# Patient Record
Sex: Male | Born: 1960
Health system: Southern US, Community
[De-identification: ages and names within clinical notes are randomized; demographics above are authoritative.]

## PROBLEM LIST (undated history)

## (undated) DIAGNOSIS — I10 Essential (primary) hypertension: Secondary | ICD-10-CM

## (undated) DIAGNOSIS — K760 Fatty (change of) liver, not elsewhere classified: Secondary | ICD-10-CM

## (undated) DIAGNOSIS — K8020 Calculus of gallbladder without cholecystitis without obstruction: Secondary | ICD-10-CM

## (undated) DIAGNOSIS — K219 Gastro-esophageal reflux disease without esophagitis: Secondary | ICD-10-CM

## (undated) DIAGNOSIS — E785 Hyperlipidemia, unspecified: Secondary | ICD-10-CM

## (undated) DIAGNOSIS — F419 Anxiety disorder, unspecified: Secondary | ICD-10-CM

## (undated) DIAGNOSIS — Z8719 Personal history of other diseases of the digestive system: Secondary | ICD-10-CM

## (undated) HISTORY — DX: Essential (primary) hypertension: I10

## (undated) HISTORY — DX: Hyperlipidemia, unspecified: E78.5

## (undated) HISTORY — DX: Anxiety disorder, unspecified: F41.9

## (undated) HISTORY — PX: OTHER SURGICAL HISTORY: SHX169

## (undated) HISTORY — PX: ORIF TOE FRACTURE: SUR965

## (undated) HISTORY — DX: Fatty (change of) liver, not elsewhere classified: K76.0

## (undated) HISTORY — DX: Calculus of gallbladder without cholecystitis without obstruction: K80.20

---

## 1999-10-24 ENCOUNTER — Encounter: Payer: Self-pay | Admitting: Emergency Medicine

## 1999-10-24 ENCOUNTER — Emergency Department (HOSPITAL_COMMUNITY): Admission: EM | Admit: 1999-10-24 | Discharge: 1999-10-24 | Payer: Self-pay | Admitting: Emergency Medicine

## 1999-11-03 ENCOUNTER — Emergency Department (HOSPITAL_COMMUNITY): Admission: EM | Admit: 1999-11-03 | Discharge: 1999-11-03 | Payer: Self-pay | Admitting: *Deleted

## 2001-02-11 ENCOUNTER — Emergency Department (HOSPITAL_COMMUNITY): Admission: EM | Admit: 2001-02-11 | Discharge: 2001-02-11 | Payer: Self-pay | Admitting: Emergency Medicine

## 2001-02-11 ENCOUNTER — Encounter: Payer: Self-pay | Admitting: Emergency Medicine

## 2001-05-05 ENCOUNTER — Emergency Department (HOSPITAL_COMMUNITY): Admission: EM | Admit: 2001-05-05 | Discharge: 2001-05-06 | Payer: Self-pay | Admitting: Emergency Medicine

## 2001-05-06 ENCOUNTER — Encounter: Payer: Self-pay | Admitting: Emergency Medicine

## 2001-11-30 ENCOUNTER — Encounter: Payer: Self-pay | Admitting: Emergency Medicine

## 2001-11-30 ENCOUNTER — Emergency Department (HOSPITAL_COMMUNITY): Admission: EM | Admit: 2001-11-30 | Discharge: 2001-11-30 | Payer: Self-pay | Admitting: Emergency Medicine

## 2002-08-18 ENCOUNTER — Emergency Department (HOSPITAL_COMMUNITY): Admission: EM | Admit: 2002-08-18 | Discharge: 2002-08-18 | Payer: Self-pay | Admitting: Emergency Medicine

## 2002-10-29 ENCOUNTER — Ambulatory Visit (HOSPITAL_BASED_OUTPATIENT_CLINIC_OR_DEPARTMENT_OTHER): Admission: RE | Admit: 2002-10-29 | Discharge: 2002-10-29 | Payer: Self-pay | Admitting: Orthopedic Surgery

## 2004-08-30 ENCOUNTER — Encounter (INDEPENDENT_AMBULATORY_CARE_PROVIDER_SITE_OTHER): Payer: Self-pay | Admitting: *Deleted

## 2004-08-30 ENCOUNTER — Encounter: Admission: RE | Admit: 2004-08-30 | Discharge: 2004-08-30 | Payer: Self-pay | Admitting: Family Medicine

## 2004-09-17 ENCOUNTER — Encounter (INDEPENDENT_AMBULATORY_CARE_PROVIDER_SITE_OTHER): Payer: Self-pay | Admitting: *Deleted

## 2004-09-17 ENCOUNTER — Encounter: Admission: RE | Admit: 2004-09-17 | Discharge: 2004-09-17 | Payer: Self-pay | Admitting: Family Medicine

## 2005-04-23 ENCOUNTER — Emergency Department (HOSPITAL_COMMUNITY): Admission: EM | Admit: 2005-04-23 | Discharge: 2005-04-24 | Payer: Self-pay | Admitting: Emergency Medicine

## 2005-05-02 ENCOUNTER — Ambulatory Visit: Payer: Self-pay | Admitting: Family Medicine

## 2005-05-05 ENCOUNTER — Ambulatory Visit: Payer: Self-pay | Admitting: Family Medicine

## 2005-11-04 ENCOUNTER — Ambulatory Visit: Payer: Self-pay | Admitting: Internal Medicine

## 2006-07-05 ENCOUNTER — Ambulatory Visit: Payer: Self-pay | Admitting: Family Medicine

## 2006-08-23 ENCOUNTER — Ambulatory Visit: Payer: Self-pay | Admitting: Family Medicine

## 2006-08-24 ENCOUNTER — Encounter: Payer: Self-pay | Admitting: Family Medicine

## 2006-12-16 ENCOUNTER — Encounter: Payer: Self-pay | Admitting: Family Medicine

## 2006-12-16 LAB — CONVERTED CEMR LAB
ALT: 66 units/L — ABNORMAL HIGH (ref 0–53)
Albumin: 4.4 g/dL (ref 3.5–5.2)
Bilirubin, Direct: 0.1 mg/dL (ref 0.0–0.3)
Cholesterol: 165 mg/dL (ref 0–200)
HDL: 53 mg/dL (ref >39)
Total CHOL/HDL Ratio: 3.1
Total Protein: 7.6 g/dL (ref 6.0–8.3)
VLDL: 19 mg/dL (ref 0–40)

## 2007-02-07 ENCOUNTER — Ambulatory Visit: Payer: Self-pay | Admitting: Internal Medicine

## 2007-07-30 ENCOUNTER — Telehealth: Payer: Self-pay | Admitting: Family Medicine

## 2007-08-29 ENCOUNTER — Telehealth (INDEPENDENT_AMBULATORY_CARE_PROVIDER_SITE_OTHER): Payer: Self-pay | Admitting: *Deleted

## 2007-08-30 ENCOUNTER — Telehealth (INDEPENDENT_AMBULATORY_CARE_PROVIDER_SITE_OTHER): Payer: Self-pay | Admitting: *Deleted

## 2007-08-31 ENCOUNTER — Ambulatory Visit: Payer: Self-pay | Admitting: Internal Medicine

## 2007-10-01 ENCOUNTER — Telehealth (INDEPENDENT_AMBULATORY_CARE_PROVIDER_SITE_OTHER): Payer: Self-pay | Admitting: *Deleted

## 2007-10-23 ENCOUNTER — Telehealth (INDEPENDENT_AMBULATORY_CARE_PROVIDER_SITE_OTHER): Payer: Self-pay | Admitting: *Deleted

## 2007-12-22 ENCOUNTER — Encounter: Payer: Self-pay | Admitting: Internal Medicine

## 2007-12-22 LAB — CONVERTED CEMR LAB
ALT: 58 units/L — ABNORMAL HIGH (ref 0–53)
AST: 44 units/L — ABNORMAL HIGH (ref 0–37)
Albumin: 4.3 g/dL (ref 3.5–5.2)
Calcium: 8.6 mg/dL (ref 8.4–10.5)
Cholesterol: 156 mg/dL (ref 0–200)
Glucose, Bld: 94 mg/dL (ref 70–99)
HDL: 47 mg/dL (ref >39)
Potassium: 3.9 meq/L (ref 3.5–5.3)
Sodium: 139 meq/L (ref 135–145)
Total CHOL/HDL Ratio: 3.3
Total Protein: 7.1 g/dL (ref 6.0–8.3)
Triglycerides: 89 mg/dL (ref <150)
VLDL: 18 mg/dL (ref 0–40)

## 2007-12-24 ENCOUNTER — Emergency Department (HOSPITAL_COMMUNITY): Admission: EM | Admit: 2007-12-24 | Discharge: 2007-12-24 | Payer: Self-pay | Admitting: Emergency Medicine

## 2007-12-25 ENCOUNTER — Ambulatory Visit: Payer: Self-pay | Admitting: Internal Medicine

## 2007-12-25 DIAGNOSIS — R7401 Elevation of levels of liver transaminase levels: Secondary | ICD-10-CM | POA: Insufficient documentation

## 2007-12-25 DIAGNOSIS — E782 Mixed hyperlipidemia: Secondary | ICD-10-CM | POA: Insufficient documentation

## 2007-12-25 DIAGNOSIS — I10 Essential (primary) hypertension: Secondary | ICD-10-CM | POA: Insufficient documentation

## 2007-12-25 DIAGNOSIS — R7402 Elevation of levels of lactic acid dehydrogenase (LDH): Secondary | ICD-10-CM | POA: Insufficient documentation

## 2007-12-25 DIAGNOSIS — R740 Nonspecific elevation of levels of transaminase and lactic acid dehydrogenase [LDH]: Secondary | ICD-10-CM

## 2007-12-25 LAB — CONVERTED CEMR LAB
Cholesterol, target level: 200 mg/dL
LDL Goal: 130 mg/dL

## 2007-12-26 ENCOUNTER — Encounter (INDEPENDENT_AMBULATORY_CARE_PROVIDER_SITE_OTHER): Payer: Self-pay | Admitting: *Deleted

## 2007-12-26 ENCOUNTER — Telehealth: Payer: Self-pay | Admitting: Internal Medicine

## 2007-12-27 ENCOUNTER — Telehealth (INDEPENDENT_AMBULATORY_CARE_PROVIDER_SITE_OTHER): Payer: Self-pay | Admitting: *Deleted

## 2008-03-28 ENCOUNTER — Telehealth (INDEPENDENT_AMBULATORY_CARE_PROVIDER_SITE_OTHER): Payer: Self-pay | Admitting: *Deleted

## 2008-04-01 ENCOUNTER — Emergency Department (HOSPITAL_COMMUNITY): Admission: EM | Admit: 2008-04-01 | Discharge: 2008-04-01 | Payer: Self-pay | Admitting: Emergency Medicine

## 2008-04-01 ENCOUNTER — Encounter: Admission: RE | Admit: 2008-04-01 | Discharge: 2008-04-01 | Payer: Self-pay | Admitting: Orthopedic Surgery

## 2008-05-16 ENCOUNTER — Telehealth (INDEPENDENT_AMBULATORY_CARE_PROVIDER_SITE_OTHER): Payer: Self-pay | Admitting: *Deleted

## 2008-05-17 ENCOUNTER — Encounter: Payer: Self-pay | Admitting: Internal Medicine

## 2008-05-17 ENCOUNTER — Encounter: Payer: Self-pay | Admitting: Family Medicine

## 2008-05-17 ENCOUNTER — Ambulatory Visit (HOSPITAL_BASED_OUTPATIENT_CLINIC_OR_DEPARTMENT_OTHER): Admission: RE | Admit: 2008-05-17 | Discharge: 2008-05-17 | Payer: Self-pay | Admitting: Internal Medicine

## 2008-05-29 ENCOUNTER — Encounter (INDEPENDENT_AMBULATORY_CARE_PROVIDER_SITE_OTHER): Payer: Self-pay | Admitting: *Deleted

## 2008-06-09 ENCOUNTER — Telehealth (INDEPENDENT_AMBULATORY_CARE_PROVIDER_SITE_OTHER): Payer: Self-pay | Admitting: *Deleted

## 2008-10-03 ENCOUNTER — Encounter: Payer: Self-pay | Admitting: Family Medicine

## 2008-10-14 ENCOUNTER — Emergency Department (HOSPITAL_BASED_OUTPATIENT_CLINIC_OR_DEPARTMENT_OTHER): Admission: EM | Admit: 2008-10-14 | Discharge: 2008-10-14 | Payer: Self-pay | Admitting: Emergency Medicine

## 2008-10-17 ENCOUNTER — Telehealth: Payer: Self-pay | Admitting: Family Medicine

## 2008-10-22 ENCOUNTER — Emergency Department (HOSPITAL_BASED_OUTPATIENT_CLINIC_OR_DEPARTMENT_OTHER): Admission: EM | Admit: 2008-10-22 | Discharge: 2008-10-22 | Payer: Self-pay | Admitting: Emergency Medicine

## 2008-11-24 ENCOUNTER — Encounter (INDEPENDENT_AMBULATORY_CARE_PROVIDER_SITE_OTHER): Payer: Self-pay | Admitting: *Deleted

## 2008-12-04 ENCOUNTER — Telehealth (INDEPENDENT_AMBULATORY_CARE_PROVIDER_SITE_OTHER): Payer: Self-pay | Admitting: *Deleted

## 2008-12-13 ENCOUNTER — Encounter: Payer: Self-pay | Admitting: Internal Medicine

## 2008-12-14 LAB — CONVERTED CEMR LAB
ALT: 52 units/L (ref 0–53)
Albumin: 4.1 g/dL (ref 3.5–5.2)
Alkaline Phosphatase: 67 units/L (ref 39–117)
BUN: 14 mg/dL (ref 6–23)
Basophils Absolute: 0 10*3/uL (ref 0.0–0.1)
Basophils Relative: 0 % (ref 0–1)
Chloride: 108 meq/L (ref 96–112)
Cholesterol: 150 mg/dL (ref 0–200)
Creatinine, Ser: 0.89 mg/dL (ref 0.40–1.50)
Eosinophils Absolute: 0.1 10*3/uL (ref 0.0–0.7)
HDL: 50 mg/dL (ref >39)
Indirect Bilirubin: 0.6 mg/dL (ref 0.0–0.9)
LDL Cholesterol: 83 mg/dL (ref 0–99)
MCHC: 34.9 g/dL (ref 30.0–36.0)
MCV: 91.2 fL (ref 78.0–100.0)
Monocytes Absolute: 0.6 10*3/uL (ref 0.1–1.0)
Neutro Abs: 3.2 10*3/uL (ref 1.7–7.7)
Neutrophils Relative %: 55 % (ref 43–77)
Potassium: 4.2 meq/L (ref 3.5–5.3)
RDW: 13 % (ref 11.5–15.5)
Total Protein: 6.8 g/dL (ref 6.0–8.3)
Triglycerides: 86 mg/dL (ref <150)

## 2008-12-16 ENCOUNTER — Encounter (INDEPENDENT_AMBULATORY_CARE_PROVIDER_SITE_OTHER): Payer: Self-pay | Admitting: *Deleted

## 2008-12-16 ENCOUNTER — Ambulatory Visit: Payer: Self-pay | Admitting: Internal Medicine

## 2008-12-16 LAB — CONVERTED CEMR LAB: OCCULT 1: NEGATIVE

## 2008-12-18 ENCOUNTER — Ambulatory Visit: Payer: Self-pay | Admitting: Internal Medicine

## 2009-01-13 DIAGNOSIS — R1310 Dysphagia, unspecified: Secondary | ICD-10-CM | POA: Insufficient documentation

## 2009-02-27 ENCOUNTER — Telehealth: Payer: Self-pay | Admitting: Internal Medicine

## 2009-03-02 ENCOUNTER — Encounter: Payer: Self-pay | Admitting: Internal Medicine

## 2009-03-04 LAB — CONVERTED CEMR LAB
Albumin, U: DETECTED %
Alpha 1, Urine: DETECTED % — AB
Alpha 2, Urine: DETECTED % — AB
Time: 24
Total Protein, Urine-Ur/day: 35 mg/24hr (ref 10–140)
Volume, Urine: 1500 mL

## 2009-03-05 ENCOUNTER — Encounter (INDEPENDENT_AMBULATORY_CARE_PROVIDER_SITE_OTHER): Payer: Self-pay | Admitting: *Deleted

## 2009-05-14 ENCOUNTER — Encounter: Payer: Self-pay | Admitting: Internal Medicine

## 2009-05-26 ENCOUNTER — Ambulatory Visit: Payer: Self-pay | Admitting: Internal Medicine

## 2009-05-26 DIAGNOSIS — H547 Unspecified visual loss: Secondary | ICD-10-CM | POA: Insufficient documentation

## 2009-09-28 ENCOUNTER — Encounter: Payer: Self-pay | Admitting: Internal Medicine

## 2009-10-19 ENCOUNTER — Encounter: Payer: Self-pay | Admitting: Internal Medicine

## 2010-02-12 ENCOUNTER — Telehealth (INDEPENDENT_AMBULATORY_CARE_PROVIDER_SITE_OTHER): Payer: Self-pay | Admitting: *Deleted

## 2010-04-17 ENCOUNTER — Encounter: Payer: Self-pay | Admitting: Internal Medicine

## 2010-04-25 LAB — CONVERTED CEMR LAB
Albumin: 4.4 g/dL (ref 3.5–5.2)
Alkaline Phosphatase: 78 units/L (ref 39–117)
Basophils Relative: 0 % (ref 0–1)
Bilirubin, Direct: 0.1 mg/dL (ref 0.0–0.3)
CO2: 23 meq/L (ref 19–32)
Chloride: 108 meq/L (ref 96–112)
Creatinine, Ser: 0.93 mg/dL (ref 0.40–1.50)
Eosinophils Absolute: 0.1 10*3/uL (ref 0.0–0.7)
Eosinophils Relative: 2 % (ref 0–5)
Glucose, Bld: 91 mg/dL (ref 70–99)
HCT: 49.1 % (ref 39.0–52.0)
HDL: 52 mg/dL (ref >39)
Hemoglobin: 16.8 g/dL (ref 13.0–17.0)
LDL Cholesterol: 123 mg/dL — ABNORMAL HIGH (ref 0–99)
Lymphs Abs: 2.2 10*3/uL (ref 0.7–4.0)
MCHC: 34.2 g/dL (ref 30.0–36.0)
MCV: 91.1 fL (ref 78.0–100.0)
Monocytes Absolute: 0.6 10*3/uL (ref 0.1–1.0)
Monocytes Relative: 9 % (ref 3–12)
Neutrophils Relative %: 59 % (ref 43–77)
RBC: 5.39 M/uL (ref 4.22–5.81)
TSH: 1.398 microintl units/mL (ref 0.350–4.500)
Total Bilirubin: 0.8 mg/dL (ref 0.3–1.2)
WBC: 7.3 10*3/uL (ref 4.0–10.5)

## 2010-04-29 ENCOUNTER — Ambulatory Visit: Payer: Self-pay | Admitting: Internal Medicine

## 2010-04-29 DIAGNOSIS — Z9189 Other specified personal risk factors, not elsewhere classified: Secondary | ICD-10-CM | POA: Insufficient documentation

## 2010-04-29 DIAGNOSIS — F411 Generalized anxiety disorder: Secondary | ICD-10-CM | POA: Insufficient documentation

## 2010-05-03 ENCOUNTER — Telehealth (INDEPENDENT_AMBULATORY_CARE_PROVIDER_SITE_OTHER): Payer: Self-pay | Admitting: *Deleted

## 2010-10-18 ENCOUNTER — Telehealth: Payer: Self-pay | Admitting: Internal Medicine

## 2010-10-26 ENCOUNTER — Telehealth: Payer: Self-pay | Admitting: Internal Medicine

## 2010-11-03 ENCOUNTER — Ambulatory Visit: Payer: Self-pay | Admitting: Internal Medicine

## 2010-12-12 ENCOUNTER — Encounter: Payer: Self-pay | Admitting: Orthopedic Surgery

## 2010-12-19 LAB — CONVERTED CEMR LAB
Bilirubin Urine: NEGATIVE
Glucose, Urine, Semiquant: NEGATIVE
Ketones, urine, test strip: NEGATIVE
pH: 6

## 2010-12-21 NOTE — Progress Notes (Signed)
Summary: Triage Call: F/U info from last Office Visit-  Phone Note Call from Patient Call back at Home Phone [phone]   Caller: Spouse-Betsy Summary of Call: Message left on VM: Patient called [facility] to request records, records perged and no longer exsist. The do have the dat as 2005 for Radiology exam Dr.Hopper was inquiring about but no actual records.  1.) Patient would like to know if Dr.Hopper would recommended he have a CT or any other radiology exams at this time  2.) Patient will follow-up with eye Dr -appointment not scheduled yet but patient will schedule  Dr.Hopper please advise./[facility]  May 03, 2010 1:04 PM   Follow-up for Phone Call        Records release for [facility] CT brain scan  2005; no repeat CT needed based on CPX exam Follow-up by: Marga Melnick MD,  May 03, 2010 3:50 PM  Additional Follow-up for Phone Call Additional follow up Details #1::        left message on machine ..........Marland KitchenDoristine Devoid  May 03, 2010 4:35 PM      Appended Document: Triage Call: F/U info from last Office Visit    Phone Note Call from Patient   Summary of Call: wife returned call they were out of town   Follow-up for Phone Call        left message on machine .....Marland KitchenMarland KitchenDoristine Devoid  May 11, 2010 2:25 PM   spoke w/ patient wife aware of recommendations.......Marland KitchenDoristine Devoid  May 12, 2010 9:33 AM

## 2010-12-21 NOTE — Assessment & Plan Note (Signed)
Summary: cpx//already had labs//lch   Vital Signs:  Patient profile:   50 year old male Height:      71.25 inches Weight:      241.8 pounds BMI:     33.61 Temp:     98.6 degrees F oral Pulse rate:   79 / minute Resp:     16 per minute BP sitting:   130 / 82  (left arm) Cuff size:   large  Vitals Entered By: Shonna Chock (April 29, 2010 2:41 PM)  Comments REVIEWED MED LIST, PATIENT AGREED DOSE AND INSTRUCTION CORRECT    History of Present Illness: Justin Townsend is here for a physical; he is essentially asymptomatic except for some anxiety symptoms.  Lipid Management History:      Positive NCEP/ATP III risk factors include male age 34 years old or older, current tobacco user, and hypertension.  Negative NCEP/ATP III risk factors include non-diabetic, no family history for ischemic heart disease, no ASHD (atherosclerotic heart disease), no prior stroke/TIA, no peripheral vascular disease, and no history of aortic aneurysm.     Allergies (verified): No Known Drug Allergies  Past History:  Past Medical History: Hyperlipidemia: NMR 2007:LDL 128( 1398/542),HDL 40,TG 128. LDL goal= <120. [place] LDL goal = < 130. Hypertension Elevated LFTs,PMH of ; PMH of Fatty Liver; Sudden Vision Lost 2010, Dr Katherina Right, Optometrist  Past Surgical History: Left thumb surgery; fracture 5th toe (rod placed);  Family History: father: healthy; mother: ulcer,partial small bowel resection,cirrhosis ? 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 Eyes:  Denies blurring, double vision, and vision loss-both eyes. ENT:  Denies hoarseness; Difficulty swallowing only if rushed. CV:  Denies chest pain or discomfort, leg cramps  with exertion, palpitations, and shortness of breath with exertion. Resp:  Complains of excessive snoring; denies cough, hypersomnolence, shortness of breath, and sputum productive; ?Apnea for a second as per wife. GI:  Denies abdominal pain, bloody stools, dark tarry stools, and indigestion. GU:  Denies discharge, dysuria, and hematuria. MS:  Denies joint pain, low back pain, mid back pain, and thoracic pain. Derm:  Denies changes in nail beds, dryness, hair loss, and lesion(s). Neuro:  Denies numbness and tingling; ? remote PMH of seizure evaluated in ER & by ? [facility];I requested  records release be completed to allow review. Psych:  Complains of anxiety, easily angered, irritability, and panic attacks; denies depression and easily tearful; "Claustrophobic"  in semi open  simulator & in car wash; more frequent  anxiety  in past 6-12  months.. Endo:  Denies cold intolerance, excessive hunger, excessive thirst, excessive urination, and heat intolerance.  Physical Exam  General:  well-nourished,in no acute distress; alert,appropriate and cooperative throughout examination Head:  Normocephalic and atraumatic without obvious abnormalities. No apparent alopecia  Eyes:  No corneal or conjunctival inflammation noted. EOMI. Perrla. Funduscopic exam benign, without hemorrhages, exudates or papilledema.No lid lag Ears:  External ear exam shows no significant lesions or deformities.  Otoscopic examination reveals clear canals, tympanic membranes are intact bilaterally without bulging, retraction, inflammation or discharge. Hearing is grossly normal bilaterally. Nose:  External nasal examination shows no deformity or inflammation. Nasal mucosa are pink and moist without lesions or exudates. Mouth:  Oral mucosa and oropharynx without lesions or exudates.  Teeth in good repair. Neck:  No deformities, masses, or tenderness noted. Lungs:  Normal respiratory effort, chest expands symmetrically. Lungs  are clear to auscultation, no crackles or wheezes. Heart:  Normal rate and regular rhythm. S1 and S2 normal without gallop, murmur, click, rub.S4 Abdomen:  Bowel sounds positive,abdomen soft and non-tender without masses, organomegaly or hernias noted. Rectal:  No external abnormalities noted. Normal sphincter tone. No rectal masses or tenderness. Genitalia:  Testes bilaterally descended without nodularity, tenderness or masses. No scrotal masses or lesions. No penis lesions or urethral discharge. Prostate:  Suboptimal prostate by DRE Msk:  No deformity or scoliosis noted   but  R  thoracic musculature > L Pulses:  R and L carotid,radial,dorsalis pedis and posterior tibial pulses are full and equal bilaterally Extremities:  No clubbing, cyanosis or deformity noted with normal full range of motion of all joints. .  trace left pedal edema and trace right pedal edema.   Neurologic:  alert & oriented X3 and DTRs symmetrical and normal.   Skin:  Intact without suspicious lesions or rashes Cervical Nodes:  No lymphadenopathy noted Axillary Nodes:  No palpable lymphadenopathy Inguinal Nodes:  No significant adenopathy Psych:  memory intact for recent and remote, not anxious appearing, not depressed appearing, and subdued.     Impression & Recommendations:  Problem # 1:  ROUTINE GENERAL MEDICAL EXAM@HEALTH  CARE FACL (ICD-V70.0)  Orders: EKG w/ Interpretation (93000)  Problem # 2:  HYPERLIPIDEMIA (ICD-272.2)  His updated medication list for this problem includes:    Zetia 10 Mg Tabs (Ezetimibe) .Marland Kitchen... Take one tablet daily,  Problem # 3:  VISUAL ACUITY, DECREASED, RIGHT EYE (ICD-369.9) PMH of ; annual monitor stressed  Problem # 4:  ANXIETY STATE, UNSPECIFIED (ICD-300.00)  His updated medication list for this problem includes:    Fluoxetine Hcl 10 Mg Caps (Fluoxetine hcl) .Marland Kitchen... 1 once daily  Problem # 5:  HYPERTENSION (ICD-401.9)  controlled  Orders: EKG w/ Interpretation  (93000)  Complete Medication List: 1)  Zetia 10 Mg Tabs (Ezetimibe) .... Take one tablet daily, 2)  Omega 3 2-3 Qd  3)  Multivitamins Tabs (Multiple vitamin) .Marland Kitchen.. 1 by mouth once daily 4)  Fluoxetine Hcl 10 Mg Caps (Fluoxetine hcl) .Marland Kitchen.. 1 once daily  Lipid Assessment/Plan:      Based on NCEP/ATP III, the patient's risk factor category is "0-1 risk factors".  The patient's lipid goals are as follows: Total cholesterol goal is 200; LDL cholesterol goal is 130; HDL cholesterol goal is 40; Triglyceride goal is 150.  His LDL cholesterol goal has been met.    Patient Instructions: 1)  Please schedule a follow-up appointment in 6-8  weeks. Monitor for apnea spells. Please sign Release of Records form to obtain [facility] records for review. Prescriptions: FLUOXETINE HCL 10 MG CAPS (FLUOXETINE HCL) 1 once daily  #30 x 5   Entered and Authorized by:   Marga Melnick MD   Signed by:   Marga Melnick MD on 04/29/2010   Method used:   Print then Give to Patient   RxID:   [id number] ZETIA 10 MG  TABS (EZETIMIBE) take one tablet daily,  #30 x 11   Entered and Authorized by:   Marga Melnick MD   Signed by:   Marga Melnick MD on 04/29/2010   Method used:   Print then Give to Patient   RxID:   [id number]    Immunization History:  Tetanus/Td Immunization History:    Tetanus/Td:  historical (08/05/2003)  Laboratory Results   Urine Tests    Routine Urinalysis   Color: straw Appearance: Clear Glucose: negative   (Normal Range: Negative) Bilirubin: negative   (Normal Range: Negative) Ketone: negative   (Normal Range: Negative) Spec. Gravity: >=1.030   (Normal Range: 1.003-1.035) Blood: negative   (Normal Range: Negative) pH: 6.0   (Normal Range: 5.0-8.0) Protein: negative   (Normal Range: Negative) Urobilinogen: 0.2   (Normal Range: 0-1) Nitrite: negative   (Normal Range: Negative) Leukocyte Esterace: negative   (Normal Range: Negative)

## 2010-12-21 NOTE — Progress Notes (Signed)
Summary: lab order request  Phone Note Call from Patient Call back at Home Phone [phone]   Caller: [facility] Summary of Call: Patient needs outatient lab order so he can do labs on Sat. prior to his CPX appt. 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 and would like to do the labwork at [facility]. This can be mailed or she will pick it up. Please advise. Initial call taken by: Lucious Groves CMA,  October 18, 2010 12:37 PM  Follow-up for Phone Call        he had CPX in 04/2010. No F/U needed until 04/2011 Follow-up by: Marga Melnick MD,  October 18, 2010 6:01 PM  Additional Follow-up for Phone Call Additional follow up Details #1::        left message on machine to call back to office. Lucious Groves CMA  October 19, 2010 8:18 AM   left message on machine to call back to office. Lucious Groves CMA  October 20, 2010 10:49 AM  Left message on machine to call back to office. Lucious Groves CMA  October 22, 2010 4:42 PM      Additional Follow-up for Phone Call Additional follow up Details #2::    Pt states that they were told that needed to f/u in 6-8 months. They just want to be postive. please advise.  Follow-up by: Army Fossa CMA,  October 22, 2010 4:50 PM  Additional Follow-up for Phone Call Additional follow up Details #3:: Details for Additional Follow-up Action Taken: I reviewed 05/11 labs & 2007 NMR Lipoprofile. Lipids are @ goal , but they should be rechecked in 03/2011. Additional Follow-up by: Marga Melnick MD,  October 22, 2010 5:47 PM  Left message on machine to call back to office. Lucious Groves CMA  October 25, 2010 10:49 AM   No return call, left message on machine at home to call back to office. Called work # and notified the patient. Lucious Groves CMA  October 26, 2010 10:23 AM

## 2010-12-21 NOTE — Progress Notes (Signed)
Summary: left number to return calls for earlier phone note  Phone Note Call from Patient   Caller: wife Carsyn Taubman Summary of Call: patient's wife works at hospital--it is almost impossible to get her at work---she says it would be fine to call home number = [phone] and leave a detailed message--getting ready to call in for prescription refills  Does patient need to keep appt with Dr Alwyn Ren for 12/14??    Does he need to "do the outside labwork"??   Initial call taken by: Jerolyn Shin,  October 26, 2010 4:57 PM  Follow-up for Phone Call        The patient was notified yesterday. Lucious Groves CMA  October 27, 2010 8:42 AM

## 2010-12-21 NOTE — Progress Notes (Signed)
Summary: Request for lab order  Phone Note Call from Patient Call back at Home Phone [phone]   Caller: Spouse Summary of Call: Patient's wife called, she would like to pick up order for CPX labs and patient will get them done over the weekend at the hospital. 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  Patient's wife would like order placed at the front and she will pick-up on Monday afternoon./[facility]rae [facility]  February 12, 2010 3:37 PM     New/Updated Medications: * PHYSICAL LABS Lipid,Hepatic, TSH, BMP, CBCD, PSA, Udip V70.0, 272.4, 995.20 Prescriptions: PHYSICAL LABS Lipid,Hepatic, TSH, BMP, CBCD, PSA, Udip V70.0, 272.4, 995.20  #1 x 0   Entered by:   Shonna Chock   Authorized by:   Marga Melnick MD   Signed by:   Shonna Chock on 02/12/2010   Method used:   Print then Give to Patient   RxID:   [id number]

## 2011-03-02 ENCOUNTER — Telehealth: Payer: Self-pay | Admitting: Internal Medicine

## 2011-03-02 NOTE — Telephone Encounter (Signed)
Fasting lipids, hepatic panel, BMET, CBC and differential, and TSH (V70.0, 272.4, 790.4)

## 2011-03-02 NOTE — Telephone Encounter (Signed)
Pt's wife states that pt normally has Dr. Hopper write him an order for his CPE labs so he can have those done at [facility] due to him being out of town a lot. Please advise. Pt has CPE scheduled 03/24/11. °

## 2011-03-02 NOTE — Telephone Encounter (Signed)
Pt's wife states that pt normally has Dr. Alwyn Ren write him an order for his CPE labs so he can have those done at [facility] due to him being out of town a lot. Please advise. Pt has CPE scheduled 03/24/11.

## 2011-03-04 NOTE — Telephone Encounter (Signed)
Spoke w/ pt wife order placed up front for pick up

## 2011-03-23 ENCOUNTER — Encounter: Payer: Self-pay | Admitting: Internal Medicine

## 2011-03-24 ENCOUNTER — Encounter: Payer: Self-pay | Admitting: Internal Medicine

## 2011-03-25 ENCOUNTER — Encounter: Payer: Self-pay | Admitting: Internal Medicine

## 2011-04-08 NOTE — Op Note (Signed)
NAME:  Justin Townsend, Justin Townsend                            ACCOUNT NO.:  [id number]   MEDICAL RECORD NO.:  [id number]                   PATIENT TYPE:  AMB   LOCATION:  [facility]                                  FACILITY:  [facility]   PHYSICIAN:  Deidre Ala, M.D.                 DATE OF BIRTH:  [birth year]   DATE OF PROCEDURE:  10/29/2002  DATE OF DISCHARGE:                                 OPERATIVE REPORT   PREOPERATIVE DIAGNOSIS:  Bennett's type fracture dislocation, left thumb  carpometacarpal joint, subacute with small minor fracture fragment.   POSTOPERATIVE DIAGNOSIS:  Bennett's type fracture dislocation, left thumb  carpometacarpal joint, subacute with small minor fracture fragment.   OPERATION PERFORMED:  Closed percutaneous pinning of Bennett's type fracture  dislocation, carpometacarpal joint, left thumb.   SURGEON:  Bradley Ferris, M.D.   ASSISTANT:  __________.   ANESTHESIA:  General with LMA.   CULTURES:  None.   DRAINS:  None.   ESTIMATED BLOOD LOSS:  Without.   TOURNIQUET TIME:  Without.   PATHOLOGIC FINDINGS AND HISTORY:  The patient had an unknown jamming injury  two weeks ago to the base of his left thumb, had pain and swelling.  X-rays  revealed a dorsal dislocation with a very minor chip fracture.  It was  unstable and he wanted to work.  I felt that casting would require a fair  amount of cast pressure and thumb abduction, so we elected to go closed  pinning to hold it until the ligaments healed down at about 6 weeks.  We  were able to reduce him anatomically, place two slightly crossed divergent K-  wires from base of the dorsal thumb metacarpal into the trapezium.  C-arm  fluoroscopy confirmed the reduction.  Again there was no major fracture  fragment that we could see at the time of pinning.  There was a small chip  that we saw preoperatively.   DESCRIPTION OF PROCEDURE:  With adequate anesthesia obtained using LMA  technique, 1 gm Ancef given IV prophylaxis, the  patient was placed in supine  position.  The left upper extremity was prepped from the fingertips to the  upper forearm in standard fashion.  After standard prepping and draping, a C-  arm was brought in [facility] and we subluxed the thumb base under fluoroscopy and  recorded it and we then reduced it and pinned it with 0.45 K-wires times two  from the base of the dorsal proximal first metacarpal down into the  trapezium.  C-arm fluoroscopy confirmed reduction and position on AP and  lateral view.  We then bent the pins so they would not migrate.  A sterile  dressing was applied and a thumb spica splint in thumb neutral with Webril  plaster and Ace.  The patient having tolerated the procedure well was  awakened and taken to recovery in satisfactory condition to be  discharged  per outpatient routine and told to call the office for appointment for  recheck on Friday and given Percocet for pain.  At this time he will be  fitted for an Orthoplast thumb spica splint.                                                Deidre Ala, M.D.    VEP/MEDQ  D:  10/29/2002  T:  10/29/2002  Job:  [id number]

## 2011-04-20 ENCOUNTER — Other Ambulatory Visit: Payer: Self-pay | Admitting: Internal Medicine

## 2011-05-28 ENCOUNTER — Other Ambulatory Visit: Payer: Self-pay | Admitting: Internal Medicine

## 2011-06-22 ENCOUNTER — Ambulatory Visit (INDEPENDENT_AMBULATORY_CARE_PROVIDER_SITE_OTHER): Payer: Self-pay | Admitting: Internal Medicine

## 2011-06-22 ENCOUNTER — Encounter: Payer: Self-pay | Admitting: Internal Medicine

## 2011-06-22 VITALS — BP 136/100 | HR 73 | Temp 99.0°F | Resp 14 | Ht 72.0 in | Wt 255.4 lb

## 2011-06-22 DIAGNOSIS — I10 Essential (primary) hypertension: Secondary | ICD-10-CM

## 2011-06-22 DIAGNOSIS — R1310 Dysphagia, unspecified: Secondary | ICD-10-CM

## 2011-06-22 DIAGNOSIS — Z0000 Encounter for general adult medical examination without abnormal findings: Secondary | ICD-10-CM

## 2011-06-22 DIAGNOSIS — E782 Mixed hyperlipidemia: Secondary | ICD-10-CM

## 2011-06-22 MED ORDER — FLUOXETINE HCL 10 MG PO CAPS: 10.0000 mg | ORAL_CAPSULE | Freq: Every day | ORAL | Status: DC

## 2011-06-22 MED ORDER — LISINOPRIL 10 MG PO TABS: 10.0000 mg | ORAL_TABLET | Freq: Every day | ORAL | Status: DC

## 2011-06-22 MED ORDER — EZETIMIBE 10 MG PO TABS: 10.0000 mg | ORAL_TABLET | Freq: Every day | ORAL | Status: DC

## 2011-06-22 NOTE — Patient Instructions (Addendum)
Preventive Health Care: Exercise at least 30-45 minutes a day,  3-4 days a week.  Eat a low-fat diet with lots of fruits and vegetables, up to 7-9 servings per day. Avoid obesity; your goal is waist measurement < 40 inches.Consume less than 40 grams of sugar per day from foods & drinks with High Fructose Corn Sugar as #2,3 or # 4 on label. Alcohol If you drink, do it moderately,less than 9 drinks per week, preferably less than 6 @ most. Health Care Power of Attorney & Living Will. Complete if not in place ; these place you in charge of your health care decisions. Blood Pressure Goal  Ideally is an AVERAGE < 135/85. This AVERAGE should be calculated from @ least 5-7 BP readings taken @ different times of day on different days of week. You should not respond to isolated BP readings , but rather the AVERAGE for that week   The triggers for REFLUX include stress; the "aspirin family" ; alcohol; peppermint; and caffeine (coffee, tea, cola, and chocolate). The aspirin family would include aspirin and the nonsteroidal agents such as ibuprofen &  Naproxen. Tylenol would not cause reflux. If having dysphagia ; food & drink should be avoided for @ least 2 hours before going to bed. GI referral if dysphagia persists or progresses

## 2011-06-22 NOTE — Progress Notes (Signed)
Subjective:    Patient ID: Justin Townsend, male    DOB: 03/25/1961, 50 y.o.   MRN: [id number]  HPI  Justin Townsend  is here for a physical;acute issues include HTN. See VS;"chill " last night.      Review of Systems Patient reports no  vision/ hearing changes,anorexia,  adenopathy, persistant / recurrent hoarseness, chest pain,palpitations, edema,persistant / recurrent cough,sputum production, hemoptysis, dyspnea(rest, exertional, paroxysmal nocturnal), gastrointestinal  bleeding (melena, rectal bleeding), abdominal pain, excessive heart burn, GU symptoms( dysuria, hematuria, pyuria, voiding/incontinence  issues) syncope, focal weakness, memory loss,numbness & tingling, skin/hair/nail changes,depression, anxiety, abnormal bruising/bleeding,or  musculoskeletal symptoms/signs.  The major and minor symptoms of rhinosinusitis were reviewed. 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 Anosmia is a chronic issue. Weight up 10 # after vacation.    Objective:   Physical Exam Gen.: Healthy and well-nourished in appearance. Alert, appropriate and cooperative throughout exam. Head: Normocephalic without obvious abnormalities;  no alopecia  Eyes: No corneal or conjunctival inflammation noted. Pupils equal round reactive to light and accommodation. Fundal exam is benign without hemorrhages, exudate, papilledema. Extraocular motion intact. Vision grossly normal with contact  lens. Ears: External  ear exam reveals no significant lesions or deformities. Canals clear .TMs normal. Hearing is grossly normal bilaterally. Nose: External nasal exam reveals no deformity or inflammation. Nasal mucosa are pink and moist. No lesions or exudates noted. Septum  normal  Mouth: Oral mucosa and oropharynx reveal no lesions or exudates. Teeth in good repair. Neck: No  deformities, masses, or tenderness noted. Range of motion &. Thyroid  normal. Lungs: Normal respiratory effort; chest expands symmetrically. Lungs are clear to auscultation without rales, wheezes, or increased work of breathing. Heart: Normal rate and rhythm. Normal S1 and S2. No gallop, click, or rub. S4 w/o  murmur. Abdomen: Bowel sounds normal; abdomen soft and nontender. No masses, organomegaly or hernias noted. No AAA or bruits Genitalia/DRE: hemorrhoidal tags ; otherwise normal.   .                                                                                   Musculoskeletal/extremities: No deformity or scoliosis noted of  the thoracic or lumbar spine. No clubbing, cyanosis, edema, or deformity noted. Range of motion  normal .Tone & strength  normal.Joints normal. Nail health  good. Vascular: Carotid, radial artery, dorsalis pedis and  posterior tibial pulses are full and equal. No bruits present. Neurologic: Alert and oriented x3. Deep tendon reflexes symmetrical and normal.          Skin: Intact without suspicious lesions or rashes. Lymph: No cervical, axillary, or inguinal lymphadenopathy present. Psych: Mood and affect are normal. Normally interactive  Assessment & Plan:  #1 comprehensive physical exam; no acute findings #2 see Problem List with Assessments & Recommendations #3 HTN suboptimally controlled Plan: see Orders

## 2011-07-13 ENCOUNTER — Ambulatory Visit: Payer: Self-pay | Admitting: Internal Medicine

## 2011-07-13 ENCOUNTER — Telehealth: Payer: Self-pay | Admitting: *Deleted

## 2011-07-13 DIAGNOSIS — Z029 Encounter for administrative examinations, unspecified: Secondary | ICD-10-CM

## 2011-07-13 NOTE — Telephone Encounter (Signed)
Left message to call office   [facility] Triage Call Report Triage Record Num: [id number] Operator: Tomasita Crumble Patient Name: Justin Townsend Call Date & Time: 07/12/2011 7:52:23PM Patient Phone: [phone] PCP: Marga Melnick Patient Gender: Male PCP Fax : [phone] Patient DOB: 04-09-1961 Practice Name: [facility] Reason for Call: Ouida Sills, calling regarding Other. PCP is Pollyann Kennedy number is [phone]. Bee sting on right upper chest 07/11/11 approx 1400. Seemed to go away by 1800. Upon arrival from work it is raised, larger than half dollar and he has temp 99. Advised see provider within 4 hours per Bites and Stings protocol. Protocol(s) Used: Bites and Stings - Insects or Spiders Recommended Outcome per Protocol: See Provider within 4 hours Reason for Outcome: Bee, yellow jacket, hornet or wasp sting AND localized symptoms worsening after 12 hours of self care Care Advice: Call EMS 911 if any of the following occur within 24 hours of bite/sting: loss of consciousness, sudden onset of difficulty breathing or wheezing, chest pain or tightness, throat tightness, severe swelling of parts of the body (e.g., eyes, lips, or tongue) other than bite/sting site, abdominal cramps. ~ ~ Call provider immediately if swollen area around the bite is 2 inches or more in diameter. For a honey bee sting, it is very important to remove the stinger immediately as it continues to inject venom. Scraping the skin with a fingernail or credit card works, but other means such as tweezers or fingers may be used to remove the stinger. ~ ~ SYMPTOM / CONDITION MANAGEMENT ~ List, or take, all current prescription(s), nonprescription or alternative medication(s) to provider for evaluation.

## 2011-07-18 NOTE — Telephone Encounter (Signed)
Left message to call office

## 2011-07-22 NOTE — Telephone Encounter (Signed)
Left message to call office

## 2011-08-18 LAB — BASIC METABOLIC PANEL
Calcium: 9.1
Creatinine, Ser: 0.9
GFR calc Af Amer: >60
GFR calc non Af Amer: >60

## 2011-08-18 LAB — HEPATIC FUNCTION PANEL
Albumin: 4.3
Total Protein: 7.6

## 2011-08-18 LAB — LIPID PANEL
Cholesterol: 179
HDL: 45
LDL Cholesterol: 118 — ABNORMAL HIGH
Total CHOL/HDL Ratio: 4
Triglycerides: 81

## 2011-12-06 ENCOUNTER — Telehealth: Payer: Self-pay | Admitting: Internal Medicine

## 2011-12-06 ENCOUNTER — Other Ambulatory Visit: Payer: Self-pay | Admitting: Internal Medicine

## 2011-12-06 ENCOUNTER — Encounter: Payer: Self-pay | Admitting: Internal Medicine

## 2011-12-06 NOTE — Telephone Encounter (Signed)
Patient's spouse, Glennon Mac, calling.  States it is time for patient to have colonoscopy, but patient is also having some trouble with swallowing, and wants a referral to Dr. Yancey Flemings, for patient to be evaluated for possible endo/colon.  Will you enter GI referral?

## 2011-12-26 ENCOUNTER — Telehealth: Payer: Self-pay | Admitting: Internal Medicine

## 2011-12-26 NOTE — Telephone Encounter (Signed)
Left message on voicemail informing patient discount card placed at the front for pick-up.

## 2011-12-26 NOTE — Telephone Encounter (Signed)
Patient states that he would like another discount card for zetia. His has expired.

## 2012-01-02 ENCOUNTER — Ambulatory Visit: Payer: Self-pay | Admitting: Internal Medicine

## 2012-03-25 ENCOUNTER — Emergency Department (HOSPITAL_COMMUNITY): Payer: BC Managed Care – PPO

## 2012-03-25 ENCOUNTER — Encounter (HOSPITAL_COMMUNITY): Payer: Self-pay | Admitting: Emergency Medicine

## 2012-03-25 ENCOUNTER — Emergency Department (HOSPITAL_COMMUNITY)
Admission: EM | Admit: 2012-03-25 | Discharge: 2012-03-25 | Disposition: A | Discharge status: Home / Self Care | Payer: BC Managed Care – PPO | Attending: Emergency Medicine | Admitting: Emergency Medicine

## 2012-03-25 ENCOUNTER — Other Ambulatory Visit: Payer: Self-pay

## 2012-03-25 DIAGNOSIS — E785 Hyperlipidemia, unspecified: Secondary | ICD-10-CM | POA: Insufficient documentation

## 2012-03-25 DIAGNOSIS — Z79899 Other long term (current) drug therapy: Secondary | ICD-10-CM | POA: Insufficient documentation

## 2012-03-25 DIAGNOSIS — R42 Dizziness and giddiness: Secondary | ICD-10-CM

## 2012-03-25 DIAGNOSIS — J45909 Unspecified asthma, uncomplicated: Secondary | ICD-10-CM | POA: Insufficient documentation

## 2012-03-25 DIAGNOSIS — R209 Unspecified disturbances of skin sensation: Secondary | ICD-10-CM | POA: Insufficient documentation

## 2012-03-25 DIAGNOSIS — I10 Essential (primary) hypertension: Secondary | ICD-10-CM | POA: Insufficient documentation

## 2012-03-25 DIAGNOSIS — R269 Unspecified abnormalities of gait and mobility: Secondary | ICD-10-CM | POA: Insufficient documentation

## 2012-03-25 DIAGNOSIS — R110 Nausea: Secondary | ICD-10-CM | POA: Insufficient documentation

## 2012-03-25 LAB — DIFFERENTIAL
Basophils Relative: 0 % (ref 0–1)
Eosinophils Absolute: 0 10*3/uL (ref 0.0–0.7)
Eosinophils Relative: 0 % (ref 0–5)
Lymphs Abs: 1.8 10*3/uL (ref 0.7–4.0)

## 2012-03-25 LAB — URINALYSIS, ROUTINE W REFLEX MICROSCOPIC
Glucose, UA: NEGATIVE mg/dL
Hgb urine dipstick: NEGATIVE
Ketones, ur: NEGATIVE mg/dL
Leukocytes, UA: NEGATIVE
Protein, ur: NEGATIVE mg/dL

## 2012-03-25 LAB — BASIC METABOLIC PANEL
BUN: 13 mg/dL (ref 6–23)
CO2: 23 mEq/L (ref 19–32)
Chloride: 105 mEq/L (ref 96–112)
Creatinine, Ser: 0.81 mg/dL (ref 0.50–1.35)

## 2012-03-25 LAB — CBC
MCH: 32.2 pg (ref 26.0–34.0)
MCHC: 35.2 g/dL (ref 30.0–36.0)
MCV: 91.3 fL (ref 78.0–100.0)
Platelets: 251 10*3/uL (ref 150–400)
RBC: 5.38 MIL/uL (ref 4.22–5.81)
RDW: 12.8 % (ref 11.5–15.5)

## 2012-03-25 MED ORDER — ONDANSETRON HCL 4 MG/2ML IJ SOLN
4.0000 mg | Freq: Once | INTRAMUSCULAR | Status: AC
  Administered 2012-03-25: 4 mg via INTRAVENOUS
  Filled 2012-03-25: qty 2

## 2012-03-25 MED ORDER — GADOBENATE DIMEGLUMINE 529 MG/ML IV SOLN
20.0000 mL | Freq: Once | INTRAVENOUS | Status: AC | PRN
  Administered 2012-03-25: 20 mL via INTRAVENOUS

## 2012-03-25 MED ORDER — MECLIZINE HCL 25 MG PO TABS
25.0000 mg | ORAL_TABLET | Freq: Once | ORAL | Status: AC
  Administered 2012-03-25: 25 mg via ORAL
  Filled 2012-03-25: qty 1

## 2012-03-25 MED ORDER — LORAZEPAM 2 MG/ML IJ SOLN
INTRAMUSCULAR | Status: AC
  Administered 2012-03-25: 16:00:00 via INTRAVENOUS
  Filled 2012-03-25: qty 1

## 2012-03-25 MED ORDER — SODIUM CHLORIDE 0.9 % IV BOLUS (SEPSIS)
1000.0000 mL | Freq: Once | INTRAVENOUS | Status: AC
  Administered 2012-03-25: 1000 mL via INTRAVENOUS

## 2012-03-25 MED ORDER — MECLIZINE HCL 12.5 MG PO TABS: 12.5000 mg | ORAL_TABLET | Freq: Three times a day (TID) | ORAL | Status: AC | PRN

## 2012-03-25 NOTE — ED Provider Notes (Addendum)
History  Scribed for Nat Christen, MD, the patient was seen in room STRE8/STRE8. This chart was scribed by Candelaria Stagers. The patient's care started at 1:05 PM    CSN: [id number]  Arrival date & time 03/25/12  1231   None     Chief Complaint  Patient presents with  . Nausea  . Dizziness     HPI Justin Townsend is a 51 y.o. male who presents to the Emergency Department complaining of nausea and dizziness that started this morning.  He describes the dizziness as his balance being off and that he fell this morning.  He reports slight numbness in right fingertips.  He denies chest pain, SOB, abdominal pain, tinnitus, weakness.  He states that he has never experienced these sx before.  Turning his head does not make the dizziness worse, sitting up does.  Pt has h/o HTN and hyperlipidemia.          Past Medical History  Diagnosis Date  . Hyperlipidemia   . Hypertension   . Asthma     only as child; resolved by age 58    Past Surgical History  Procedure Date  . Thumb surgery   . Orif toe fracture     Family History  Problem Relation Age of Onset  . Ulcers Mother   . Cirrhosis Mother     ? Alcohol Related   . Cancer Paternal Grandfather     CNS Cancer  . Panic disorder Other     Maternal FH  . Anxiety disorder Other     Maternal FH    History  Substance Use Topics  . Smoking status: Never Smoker   . Smokeless tobacco: Not on file   Comment: rare cigar  . Alcohol Use: 7.2 oz/week    12 Cans of beer per week     Social      Review of Systems  Constitutional: Negative.  Negative for fever and chills.  HENT: Negative.   Eyes: Negative.  Negative for discharge and redness.  Respiratory: Negative.  Negative for cough and shortness of breath.   Cardiovascular: Negative.  Negative for chest pain.  Gastrointestinal: Positive for nausea. Negative for vomiting, abdominal pain and abdominal distention.  Genitourinary: Negative.  Negative for hematuria.    Musculoskeletal: Positive for gait problem. Negative for back pain.  Skin: Negative.  Negative for color change and rash.  Neurological: Positive for dizziness. Negative for syncope, weakness and headaches.  Hematological: Negative.  Negative for adenopathy.  Psychiatric/Behavioral: Negative.  Negative for confusion.  All other systems reviewed and are negative.    Allergies  Review of patient's allergies indicates no known allergies.  Home Medications   Current Outpatient Rx  Name Route Sig Dispense Refill  . BENADRYL PO Oral Take 2 tablets by mouth at bedtime.    Marland Kitchen EZETIMIBE 10 MG PO TABS Oral Take 10 mg by mouth daily.    Marland Kitchen LISINOPRIL 10 MG PO TABS Oral Take 10 mg by mouth daily.    . ADULT MULTIVITAMIN W/MINERALS CH Oral Take 1 tablet by mouth daily.    Marland Kitchen FLUOXETINE HCL 10 MG PO CAPS Oral Take 1 capsule (10 mg total) by mouth daily. 90 capsule 3    BP 139/93  Pulse 77  Temp(Src) 98.1 F (36.7 C) (Oral)  Resp 16  SpO2 99%  Physical Exam  Nursing note and vitals reviewed. Constitutional: He is oriented to person, place, and time. He appears well-developed and well-nourished. No distress.  HENT:  Head: Normocephalic and atraumatic.  Eyes: Conjunctivae and EOM are normal. Pupils are equal, round, and reactive to light.  Neck: Normal range of motion. Neck supple.  Cardiovascular: Normal rate and regular rhythm.  Exam reveals no gallop.   No murmur heard. Pulmonary/Chest: Effort normal. He has no wheezes. He has no rales.  Abdominal: Soft. He exhibits no distension. There is no tenderness. There is no rebound and no guarding.  Musculoskeletal: Normal range of motion.  Neurological: He is alert and oriented to person, place, and time.       Normal sensation to light touch.  Normal fields of vision.  Face symmetric. Tongue is midline.  Cranial nerves 1-10 intact.  Normal finger to nose bilaterally.  No pronator drift.  Normal heel to shin bilaterally. Good strength in arms  and legs.  Normal speech.   Skin: Skin is warm and dry. He is not diaphoretic.  Psychiatric: He has a normal mood and affect. His behavior is normal. Judgment and thought content normal.    ED Course  Procedures  DIAGNOSTIC STUDIES: Oxygen Saturation is 99% on room air, normal by my interpretation.    COORDINATION OF CARE: Ordered: 1:20PM CBC, Differential, Basic metabolic panel, Orthostatic vital signs, Ethanol, Urinalysis, Routine w reflex microscopic, Drug screen panel, emergency, CT Head Wo Contrast, ED EKG, Zofran injection 4mg .    Labs Reviewed - No data to display No results found.   No diagnosis found.    Date: 03/25/2012  Rate: 82  Rhythm: normal sinus rhythm  QRS Axis: normal  Intervals: normal  ST/T Wave abnormalities: normal  Conduction Disutrbances:Incomplete right bundle branch block.  Narrative Interpretation:   Old EKG Reviewed: unchanged from 10/29/2002   MDM  Patient with symptoms concerning for vertigo of unclear etiology.  As they only happen when the patient stands up I will consider dehydration and obtain some orthostatic vital signs.  Patient does have some gait disturbance which could be associated with a significant peripheral vertigo although the patient does not have symptoms with turning his head.  Given the gait disturbance with a new onset of the vertigo I will obtain a head CT.  Patient may need to progress to an MRI MRA if no further specific causes are able to be determined during this visit.  I personally performed the services described in this documentation, which was scribed in my presence. The recorded information has been reviewed and considered.         Nat Christen, MD 03/25/12 1344  Nat Christen, MD 03/25/12 [phone]

## 2012-03-25 NOTE — ED Notes (Signed)
Pt c/o nausea and dizziness with standing this am upon waking; pt sts drank ETOH yesterday but denies ever having this before; pt denies diarrhea

## 2012-03-25 NOTE — Discharge Instructions (Signed)

## 2012-03-25 NOTE — ED Provider Notes (Signed)
History     CSN: [id number]  Arrival date & time 03/25/12  1231   First MD Initiated Contact with Patient 03/25/12 1303      Chief Complaint  Patient presents with  . Nausea  . Dizziness    (Consider location/radiation/quality/duration/timing/severity/associated sxs/prior treatment) HPI  Past Medical History  Diagnosis Date  . Hyperlipidemia   . Hypertension   . Asthma     only as child; resolved by age 51    Past Surgical History  Procedure Date  . Thumb surgery   . Orif toe fracture     Family History  Problem Relation Age of Onset  . Ulcers Mother   . Cirrhosis Mother     ? Alcohol Related   . Cancer Paternal Grandfather     CNS Cancer  . Panic disorder Other     Maternal FH  . Anxiety disorder Other     Maternal FH    History  Substance Use Topics  . Smoking status: Never Smoker   . Smokeless tobacco: Not on file   Comment: rare cigar  . Alcohol Use: 7.2 oz/week    12 Cans of beer per week     Social      Review of Systems  Allergies  Review of patient's allergies indicates no known allergies.  Home Medications   Current Outpatient Rx  Name Route Sig Dispense Refill  . BENADRYL PO Oral Take 2 tablets by mouth at bedtime.    Marland Kitchen EZETIMIBE 10 MG PO TABS Oral Take 10 mg by mouth daily.    Marland Kitchen FLUOXETINE HCL 10 MG PO CAPS Oral Take 10 mg by mouth daily.    Marland Kitchen LISINOPRIL 10 MG PO TABS Oral Take 10 mg by mouth daily.    . ADULT MULTIVITAMIN W/MINERALS CH Oral Take 1 tablet by mouth daily.    . OMEGA-3-ACID ETHYL ESTERS 1 G PO CAPS Oral Take 2 g by mouth daily.    Marland Kitchen MECLIZINE HCL 12.5 MG PO TABS Oral Take 1 tablet (12.5 mg total) by mouth 3 (three) times daily as needed. 20 tablet 0    BP 145/80  Pulse 94  Temp(Src) 98.1 F (36.7 C) (Oral)  Resp 20  SpO2 96%  Physical Exam  ED Course  Procedures (including critical care time)  Labs Reviewed  CBC - Abnormal; Notable for the following:    WBC 11.8 (*)    Hemoglobin 17.3 (*)    All other  components within normal limits  DIFFERENTIAL - Abnormal; Notable for the following:    Neutrophils Relative 79 (*)    Neutro Abs 9.3 (*)    All other components within normal limits  URINALYSIS, ROUTINE W REFLEX MICROSCOPIC  ETHANOL  BASIC METABOLIC PANEL  URINE RAPID DRUG SCREEN (HOSP PERFORMED)   Ct Head Wo Contrast  03/25/2012  *RADIOLOGY REPORT*  Clinical Data: Nausea, dizziness, fell.  CT HEAD WITHOUT CONTRAST  Technique:  Contiguous axial images were obtained from the base of the skull through the vertex without contrast.  Comparison: 04/01/2008  Findings: There is no evidence of acute intracranial hemorrhage, brain edema, mass lesion, acute infarction,   mass effect, or midline shift. Acute infarct may be inapparent on noncontrast CT. No other intra-axial abnormalities are seen, and the ventricles and sulci are within normal limits in size and symmetry.   No abnormal extra-axial fluid collections or masses are identified.  No significant calvarial abnormality.  IMPRESSION: 1. Negative for bleed or other acute intracranial process.  Original Report Authenticated By: Osa Craver, M.D.   Mr Maxine Glenn Head Wo Contrast  03/25/2012  *RADIOLOGY REPORT*  Clinical Data:  Dizziness.  Hypertension.  Hyperlipidemia.  MRI HEAD WITH AND WITHOUT CONTRAST MRA HEAD WITHOUT CONTRAST  Technique: Multiplanar, multiecho pulse sequences of the brain and surrounding structures were obtained according to standard protocol with and without intravenous contrast.  Angiographic images of the Circle of Willis were obtained using MRA technique without intravenous contrast.  Contrast: 20 ml MultiHance.  Comparison05/03/2012 head CT.  No comparison MR.  MRI HEAD  Findings: Motion degraded exam.  No acute infarct.  No intracranial hemorrhage.  The sella is a shallow and the pituitary gland is significantly small. Evaluation limited on this motion degraded examination.  No intracranial mass lesion.  Very mild parietal lobe  atrophy without hydrocephalus.  Cerebellar tonsils are minimally low-lying although within the range of normal limits.  Mucosal thickening inferior aspect of the maxillary sinuses bilaterally.  IMPRESSION: No acute infarct.  Please see above.  MRA HEAD  Findings: Motion degraded exam.  Mild narrowing A1 segment anterior cerebral artery bilaterally.  Middle cerebral artery branch vessel irregularity bilaterally.  Mild to moderate narrowing distal right vertebral artery.  Moderate tandem stenoses PICAs bilaterally.  Mild to moderate irregularity and narrowing of portions of the basilar artery.  Nonvisualization AICAs.  Poor delineation of a majority of the right superior cerebellar artery.  High-grade focal stenosis P1 segment left posterior cerebral artery.  Posterior cerebral artery branch vessel irregularity.  No aneurysm noted on this motion degraded exam.  IMPRESSION: Intracranial vascular irregularity.  Motion degradation partially contributes to this appearance.  Original Report Authenticated By: Fuller Canada, M.D.   Mr Laqueta Jean AV Contrast  03/25/2012  *RADIOLOGY REPORT*  Clinical Data:  Dizziness.  Hypertension.  Hyperlipidemia.  MRI HEAD WITH AND WITHOUT CONTRAST MRA HEAD WITHOUT CONTRAST  Technique: Multiplanar, multiecho pulse sequences of the brain and surrounding structures were obtained according to standard protocol with and without intravenous contrast.  Angiographic images of the Circle of Willis were obtained using MRA technique without intravenous contrast.  Contrast: 20 ml MultiHance.  Comparison05/03/2012 head CT.  No comparison MR.  MRI HEAD  Findings: Motion degraded exam.  No acute infarct.  No intracranial hemorrhage.  The sella is a shallow and the pituitary gland is significantly small. Evaluation limited on this motion degraded examination.  No intracranial mass lesion.  Very mild parietal lobe atrophy without hydrocephalus.  Cerebellar tonsils are minimally low-lying although within  the range of normal limits.  Mucosal thickening inferior aspect of the maxillary sinuses bilaterally.  IMPRESSION: No acute infarct.  Please see above.  MRA HEAD  Findings: Motion degraded exam.  Mild narrowing A1 segment anterior cerebral artery bilaterally.  Middle cerebral artery branch vessel irregularity bilaterally.  Mild to moderate narrowing distal right vertebral artery.  Moderate tandem stenoses PICAs bilaterally.  Mild to moderate irregularity and narrowing of portions of the basilar artery.  Nonvisualization AICAs.  Poor delineation of a majority of the right superior cerebellar artery.  High-grade focal stenosis P1 segment left posterior cerebral artery.  Posterior cerebral artery branch vessel irregularity.  No aneurysm noted on this motion degraded exam.  IMPRESSION: Intracranial vascular irregularity.  Motion degradation partially contributes to this appearance.  Original Report Authenticated By: Fuller Canada, M.D.     1. Vertigo       MDM  Pt is ambulating without any problem:no acute findings noted:will send pt home with  meclizine and ent referral        Teressa Lower, NP 03/25/12 1825

## 2012-03-25 NOTE — ED Notes (Signed)
Pt asking for dinner tray. Explained that we need to wait until results of mri exam are back. Pt and daughter voice understanding.

## 2012-03-25 NOTE — ED Provider Notes (Signed)
Medical screening examination/treatment/procedure(s) were conducted as a shared visit with non-physician practitioner(s) and myself.  I personally evaluated the patient during the encounter   Nat Christen, MD 03/25/12 [phone]

## 2012-03-26 ENCOUNTER — Encounter: Payer: Self-pay | Admitting: Internal Medicine

## 2012-03-26 ENCOUNTER — Emergency Department (INDEPENDENT_AMBULATORY_CARE_PROVIDER_SITE_OTHER): Payer: BC Managed Care – PPO

## 2012-03-26 ENCOUNTER — Encounter (HOSPITAL_BASED_OUTPATIENT_CLINIC_OR_DEPARTMENT_OTHER): Payer: Self-pay | Admitting: *Deleted

## 2012-03-26 ENCOUNTER — Ambulatory Visit (INDEPENDENT_AMBULATORY_CARE_PROVIDER_SITE_OTHER): Payer: BC Managed Care – PPO | Admitting: Internal Medicine

## 2012-03-26 ENCOUNTER — Emergency Department (HOSPITAL_BASED_OUTPATIENT_CLINIC_OR_DEPARTMENT_OTHER)
Admission: EM | Admit: 2012-03-26 | Discharge: 2012-03-26 | Disposition: A | Discharge status: Home / Self Care | Payer: BC Managed Care – PPO | Attending: Emergency Medicine | Admitting: Emergency Medicine

## 2012-03-26 VITALS — BP 124/98 | HR 86 | Temp 99.5°F | Wt 243.4 lb

## 2012-03-26 DIAGNOSIS — R5381 Other malaise: Secondary | ICD-10-CM

## 2012-03-26 DIAGNOSIS — R509 Fever, unspecified: Secondary | ICD-10-CM

## 2012-03-26 DIAGNOSIS — B349 Viral infection, unspecified: Secondary | ICD-10-CM

## 2012-03-26 DIAGNOSIS — R079 Chest pain, unspecified: Secondary | ICD-10-CM

## 2012-03-26 DIAGNOSIS — E785 Hyperlipidemia, unspecified: Secondary | ICD-10-CM | POA: Insufficient documentation

## 2012-03-26 DIAGNOSIS — M2550 Pain in unspecified joint: Secondary | ICD-10-CM

## 2012-03-26 DIAGNOSIS — IMO0001 Reserved for inherently not codable concepts without codable children: Secondary | ICD-10-CM | POA: Insufficient documentation

## 2012-03-26 DIAGNOSIS — R930 Abnormal findings on diagnostic imaging of skull and head, not elsewhere classified: Secondary | ICD-10-CM

## 2012-03-26 DIAGNOSIS — H8110 Benign paroxysmal vertigo, unspecified ear: Secondary | ICD-10-CM

## 2012-03-26 DIAGNOSIS — R42 Dizziness and giddiness: Secondary | ICD-10-CM

## 2012-03-26 DIAGNOSIS — B9789 Other viral agents as the cause of diseases classified elsewhere: Secondary | ICD-10-CM | POA: Insufficient documentation

## 2012-03-26 DIAGNOSIS — I10 Essential (primary) hypertension: Secondary | ICD-10-CM | POA: Insufficient documentation

## 2012-03-26 MED ORDER — OSELTAMIVIR PHOSPHATE 75 MG PO CAPS: 75.0000 mg | ORAL_CAPSULE | Freq: Two times a day (BID) | ORAL | Status: AC

## 2012-03-26 MED ORDER — KETOROLAC TROMETHAMINE 60 MG/2ML IM SOLN
60.0000 mg | Freq: Once | INTRAMUSCULAR | Status: AC
  Administered 2012-03-26: 60 mg via INTRAMUSCULAR
  Filled 2012-03-26: qty 2

## 2012-03-26 NOTE — ED Provider Notes (Signed)
History     CSN: [id number]  Arrival date & time 03/26/12  0124   First MD Initiated Contact with Patient 03/26/12 0129      Chief Complaint  Patient presents with  . Fever    (Consider location/radiation/quality/duration/timing/severity/associated sxs/prior treatment) Patient is a 51 y.o. male presenting with fever and musculoskeletal pain.  Fever Primary symptoms of the febrile illness include fever and myalgias. Primary symptoms do not include headaches, cough, shortness of breath, abdominal pain, vomiting, diarrhea, dysuria or rash. The current episode started today. This is a new problem. The problem has not changed since onset. The fever began today. The fever has been unchanged since its onset. The maximum temperature recorded prior to his arrival was 102 to 102.9 F.  Associated with: none. Risk factors: none. Muscle Pain This is a new problem. The current episode started 1 to 2 hours ago. The problem occurs constantly. The problem has not changed since onset.Pertinent negatives include no chest pain, no abdominal pain, no headaches and no shortness of breath. The symptoms are aggravated by nothing. The symptoms are relieved by nothing. Treatments tried: ibuprofen. The treatment provided no relief.    Past Medical History  Diagnosis Date  . Hyperlipidemia   . Hypertension   . Asthma     only as child; resolved by age 20    Past Surgical History  Procedure Date  . Thumb surgery   . Orif toe fracture     Family History  Problem Relation Age of Onset  . Ulcers Mother   . Cirrhosis Mother     ? Alcohol Related   . Cancer Paternal Grandfather     CNS Cancer  . Panic disorder Other     Maternal FH  . Anxiety disorder Other     Maternal FH    History  Substance Use Topics  . Smoking status: Never Smoker   . Smokeless tobacco: Not on file   Comment: rare cigar  . Alcohol Use: 7.2 oz/week    12 Cans of beer per week     Social      Review of Systems    Constitutional: Positive for fever.  HENT: Negative for sore throat, neck pain and neck stiffness.   Respiratory: Negative for cough and shortness of breath.   Cardiovascular: Negative for chest pain.  Gastrointestinal: Negative for vomiting, abdominal pain and diarrhea.  Genitourinary: Negative for dysuria.  Musculoskeletal: Positive for myalgias.  Skin: Negative for rash.  Neurological: Negative for headaches.  All other systems reviewed and are negative.    Allergies  Review of patient's allergies indicates no known allergies.  Home Medications   Current Outpatient Rx  Name Route Sig Dispense Refill  . BENADRYL PO Oral Take 2 tablets by mouth at bedtime.    Marland Kitchen EZETIMIBE 10 MG PO TABS Oral Take 10 mg by mouth daily.    Marland Kitchen FLUOXETINE HCL 10 MG PO CAPS Oral Take 10 mg by mouth daily.    Marland Kitchen LISINOPRIL 10 MG PO TABS Oral Take 10 mg by mouth daily.    Marland Kitchen MECLIZINE HCL 12.5 MG PO TABS Oral Take 1 tablet (12.5 mg total) by mouth 3 (three) times daily as needed. 20 tablet 0  . ADULT MULTIVITAMIN W/MINERALS CH Oral Take 1 tablet by mouth daily.    . OMEGA-3-ACID ETHYL ESTERS 1 G PO CAPS Oral Take 2 g by mouth daily.    . OSELTAMIVIR PHOSPHATE 75 MG PO CAPS Oral Take 1 capsule (75 mg  total) by mouth every 12 (twelve) hours. 10 capsule 0    BP 158/88  Pulse 109  Temp(Src) 99.6 F (37.6 C) (Oral)  Resp 18  SpO2 99%  Physical Exam  Constitutional: He is oriented to person, place, and time. He appears well-developed and well-nourished. No distress.  HENT:  Head: Normocephalic and atraumatic.  Right Ear: Tympanic membrane is not injected.  Left Ear: Tympanic membrane is not injected.  Mouth/Throat: Oropharynx is clear and moist.  Eyes: Conjunctivae are normal. Pupils are equal, round, and reactive to light.  Neck: Normal range of motion. Neck supple.  Cardiovascular: Normal rate and regular rhythm.   Pulmonary/Chest: Effort normal and breath sounds normal. He has no wheezes. He has no  rales.  Abdominal: Soft. Bowel sounds are normal. There is no tenderness. There is no rebound and no guarding.  Musculoskeletal: Normal range of motion.  Lymphadenopathy:    He has no cervical adenopathy.  Neurological: He is alert and oriented to person, place, and time.  Skin: Skin is warm and dry. He is not diaphoretic.  Psychiatric: He has a normal mood and affect.    ED Course  Procedures (including critical care time)  Labs Reviewed - No data to display Dg Chest 2 View  03/26/2012  *RADIOLOGY REPORT*  Clinical Data: Fever and generalized chest pain; dizziness and weakness.  CHEST - 2 VIEW  Comparison: Chest radiograph performed 12/24/2007  Findings: The lungs are well-aerated and clear.  There is no evidence of focal opacification, pleural effusion or pneumothorax.  The heart is normal in size; the mediastinal contour is within normal limits.  No acute osseous abnormalities are seen.  IMPRESSION: No acute cardiopulmonary process seen.  Original Report Authenticated By: Tonia Ghent, M.D.   Ct Head Wo Contrast  03/25/2012  *RADIOLOGY REPORT*  Clinical Data: Nausea, dizziness, fell.  CT HEAD WITHOUT CONTRAST  Technique:  Contiguous axial images were obtained from the base of the skull through the vertex without contrast.  Comparison: 04/01/2008  Findings: There is no evidence of acute intracranial hemorrhage, brain edema, mass lesion, acute infarction,   mass effect, or midline shift. Acute infarct may be inapparent on noncontrast CT. No other intra-axial abnormalities are seen, and the ventricles and sulci are within normal limits in size and symmetry.   No abnormal extra-axial fluid collections or masses are identified.  No significant calvarial abnormality.  IMPRESSION: 1. Negative for bleed or other acute intracranial process.  Original Report Authenticated By: Osa Craver, M.D.   Mr Maxine Glenn Head Wo Contrast  03/25/2012  *RADIOLOGY REPORT*  Clinical Data:  Dizziness.  Hypertension.   Hyperlipidemia.  MRI HEAD WITH AND WITHOUT CONTRAST MRA HEAD WITHOUT CONTRAST  Technique: Multiplanar, multiecho pulse sequences of the brain and surrounding structures were obtained according to standard protocol with and without intravenous contrast.  Angiographic images of the Circle of Willis were obtained using MRA technique without intravenous contrast.  Contrast: 20 ml MultiHance.  Comparison05/03/2012 head CT.  No comparison MR.  MRI HEAD  Findings: Motion degraded exam.  No acute infarct.  No intracranial hemorrhage.  The sella is a shallow and the pituitary gland is significantly small. Evaluation limited on this motion degraded examination.  No intracranial mass lesion.  Very mild parietal lobe atrophy without hydrocephalus.  Cerebellar tonsils are minimally low-lying although within the range of normal limits.  Mucosal thickening inferior aspect of the maxillary sinuses bilaterally.  IMPRESSION: No acute infarct.  Please see above.  MRA HEAD  Findings: Motion degraded exam.  Mild narrowing A1 segment anterior cerebral artery bilaterally.  Middle cerebral artery branch vessel irregularity bilaterally.  Mild to moderate narrowing distal right vertebral artery.  Moderate tandem stenoses PICAs bilaterally.  Mild to moderate irregularity and narrowing of portions of the basilar artery.  Nonvisualization AICAs.  Poor delineation of a majority of the right superior cerebellar artery.  High-grade focal stenosis P1 segment left posterior cerebral artery.  Posterior cerebral artery branch vessel irregularity.  No aneurysm noted on this motion degraded exam.  IMPRESSION: Intracranial vascular irregularity.  Motion degradation partially contributes to this appearance.  Original Report Authenticated By: Fuller Canada, M.D.   Mr Laqueta Jean WG Contrast  03/25/2012  *RADIOLOGY REPORT*  Clinical Data:  Dizziness.  Hypertension.  Hyperlipidemia.  MRI HEAD WITH AND WITHOUT CONTRAST MRA HEAD WITHOUT CONTRAST  Technique:  Multiplanar, multiecho pulse sequences of the brain and surrounding structures were obtained according to standard protocol with and without intravenous contrast.  Angiographic images of the Circle of Willis were obtained using MRA technique without intravenous contrast.  Contrast: 20 ml MultiHance.  Comparison05/03/2012 head CT.  No comparison MR.  MRI HEAD  Findings: Motion degraded exam.  No acute infarct.  No intracranial hemorrhage.  The sella is a shallow and the pituitary gland is significantly small. Evaluation limited on this motion degraded examination.  No intracranial mass lesion.  Very mild parietal lobe atrophy without hydrocephalus.  Cerebellar tonsils are minimally low-lying although within the range of normal limits.  Mucosal thickening inferior aspect of the maxillary sinuses bilaterally.  IMPRESSION: No acute infarct.  Please see above.  MRA HEAD  Findings: Motion degraded exam.  Mild narrowing A1 segment anterior cerebral artery bilaterally.  Middle cerebral artery branch vessel irregularity bilaterally.  Mild to moderate narrowing distal right vertebral artery.  Moderate tandem stenoses PICAs bilaterally.  Mild to moderate irregularity and narrowing of portions of the basilar artery.  Nonvisualization AICAs.  Poor delineation of a majority of the right superior cerebellar artery.  High-grade focal stenosis P1 segment left posterior cerebral artery.  Posterior cerebral artery branch vessel irregularity.  No aneurysm noted on this motion degraded exam.  IMPRESSION: Intracranial vascular irregularity.  Motion degradation partially contributes to this appearance.  Original Report Authenticated By: Fuller Canada, M.D.     1. Viral syndrome       MDM  Labs not done as were performed within the last 24 hours at [facility] when seen for dizziness.  Work up with head CT all normal including urine.  Return for headache, stiff neck, productive cough or any concerns.  Follow up with your family doctor  for recheck.        Jasmine Awe, MD 03/26/12 [phone]

## 2012-03-26 NOTE — Patient Instructions (Addendum)
Go to Web M.D. for information on benign positional vertigo (BPV) . Physical therapy exercises can treat that. NSAIDS ( Aleve, Advil, Naproxen) or Tylenol every 4 hrs as needed for fever as discussed based on label recommendations.Please call if there is a significant change in symptoms  or progression of severity of symptoms compared to the  History as recorded in the copy of the office note you were provided. Review that record for accuracy.Share results with all MDs seen .

## 2012-03-26 NOTE — Progress Notes (Signed)
  Subjective:    Patient ID: Justin Townsend, male    DOB: 1961/02/04, 51 y.o.   MRN: [id number]  HPI His complex history was reviewed. Symptoms began acutely 03/25/12 at 7 AM upon awakening. He felt dizzy as saline as he sat up. Upon standing he felt as if he would fall to the right.  The symptoms persisted each time he would try to sit up. This prompted an ER visit approximately 11 AM. MRI was affected by motion degradation. There was suggestion of possible mild to moderate narrowing of the distal right vertebral artery and mild to moderate irregularity and narrowing of portions of the basilar artery. He required lorazepam to complete the study.  EKG revealed incomplete right bundle branch block , but it was otherwise unremarkable  Orthostatics in the emergency room were negative for significant blood pressure drop or pulse increase. His pulse ran in the 90s on his emergency room   He returned home and slept most of the day. He been given medication for benign positional vertigo  At 1 AM this morning he woke with diffuse  joint discomfort involving  his upper extremities ,knees and feet in context of  with a temperature up to 102.6. He returned to the emergency room. Chest x-ray was negative. White count was 11,800 with increased neutrophils. Chemistries were normal; he was told that he had flu symptoms recommend he followup with his LMD. Tamiflu was not prescribed. He did not take the flu shot in 2012   Review of Systems There was no cardiac prodrome such as palpitations, irregular rhythm, heart rate change prior to the original episode. He also denies any headache, numbness or tingling, extremity weakness. There was marked imbalance with ongoing dizziness; he did not fall. There was no associated seizure activity or frank syncope. He denies any symptoms to suggest  respiratory tract infection such as frontal headache, facial pain nasal purulence or sputum production. He denies double vision,  blurred vision, visual loss with episode. There was no associated tinnitus or hearing loss. The episode was associated with nausea but no significant diaphoresis, chest pain, shortness of breath.. Despite the fever, he denies diarrhea, hematuria, pyuria, dysuria, or rash        Objective:   Physical Exam  Gen. appearance: Well-nourished, in no distress. He appears somewhat fatigued; but in no acute distress Eyes: Extraocular motion intact, field of vision normal, vision grossly intact with lenses, no nystagmus ENT: Canals clear, tympanic membranes normal, tuning fork exam normal, hearing grossly normal Neck: Normal range of motion, no masses, normal thyroid. Neck is supple with no meningismus Cardiovascular: Rate and rhythm normal; no murmurs, gallops . S4 Muscle skeletal: Range of motion, tone, &  strength normal. He is able to lie flat and sit up without help. Neuro:no cranial nerve deficit, deep tendon  reflexes normal, gait normal. Romberg testing and finger to nose testing are normal. positioning could not elicit symptoms Lymph: No cervical or axillary LA Skin: Slightly damp without suspicious lesions or rashes Psych: no anxiety or mood change. Normally interactive and cooperative.         Assessment & Plan:  #1 initial symptoms do suggest benign positional vertigo  #2 fever associated with arthralgias. White count was mildly elevated without localizing signs or symptoms.  #3 possible vertebral and basilar artery narrowing versus motion artifact. This should be  evaluated by neurologist

## 2012-03-26 NOTE — ED Notes (Signed)
Woke up this morning with fever, and generalized pains. Was seen last night in [facility] ER with dizziness and weakness.

## 2012-03-26 NOTE — Discharge Instructions (Signed)
Muscle Cramps  Muscle cramps are due to sudden involuntary muscle contraction. This means you have no control over the tightening of a muscle (or muscles). Often there are no obvious causes. Muscle cramps may occur with overexertion. They may also occur with chilling of the muscles. An example of a muscle chilling activity is swimming. It is uncommon for cramps to be due to a serious underlying disorder. In most cases, muscle cramps improve (or leave) within minutes.  CAUSES   Some common causes are:   Injury.   Infections, especially viral.   Abnormal levels of the salts and ions in your blood (electrolytes). This could happen if you are taking water pills (diuretics).   Blood vessel disease where not enough blood is getting to the muscles (intermittent claudication).  Some uncommon causes are:   Side effects of some medicine (such as lithium).   Alcohol abuse.   Diseases where there is soreness (inflammation) of the muscular system.  HOME CARE INSTRUCTIONS    It may be helpful to massage, stretch, and relax the affected muscle.   Taking a dose of over-the-counter diphenhydramine is helpful for night leg cramps.  SEEK MEDICAL CARE IF:   Cramps are frequent and not relieved with medicine.  MAKE SURE YOU:    Understand these instructions.   Will watch your condition.   Will get help right away if you are not doing well or get worse.  Document Released: 04/29/2002 Document Revised: 10/27/2011 Document Reviewed: 10/29/2008  ExitCare Patient Information 2012 [employer].

## 2012-07-02 ENCOUNTER — Other Ambulatory Visit: Payer: Self-pay | Admitting: Internal Medicine

## 2012-07-02 MED ORDER — LISINOPRIL 10 MG PO TABS: 10.0000 mg | ORAL_TABLET | Freq: Every day | ORAL | Status: DC

## 2012-07-02 NOTE — Telephone Encounter (Signed)
refill lisinopril 10mg  tab #90 - take one tablet each day  Last fill 3.30.13 Last ov 5.6.13 ED Follow up

## 2012-07-02 NOTE — Telephone Encounter (Signed)
RX sent

## 2012-07-11 ENCOUNTER — Other Ambulatory Visit: Payer: Self-pay | Admitting: Internal Medicine

## 2012-07-11 NOTE — Telephone Encounter (Signed)
LIPID/HEP 272.4/995.20  

## 2012-07-16 ENCOUNTER — Telehealth: Payer: Self-pay | Admitting: Internal Medicine

## 2012-07-16 MED ORDER — FLUOXETINE HCL 10 MG PO CAPS: 10.0000 mg | ORAL_CAPSULE | Freq: Every day | ORAL | Status: DC

## 2012-07-16 NOTE — Telephone Encounter (Signed)
Refill: Fluoxetine 10 mg cap. Take one capsule by mouth every day. Qty 90. Last fill 04-07-12

## 2012-07-17 ENCOUNTER — Other Ambulatory Visit: Payer: Self-pay | Admitting: Internal Medicine

## 2012-08-01 ENCOUNTER — Encounter (HOSPITAL_BASED_OUTPATIENT_CLINIC_OR_DEPARTMENT_OTHER): Payer: Self-pay | Admitting: *Deleted

## 2012-08-01 ENCOUNTER — Emergency Department (HOSPITAL_BASED_OUTPATIENT_CLINIC_OR_DEPARTMENT_OTHER)
Admission: EM | Admit: 2012-08-01 | Discharge: 2012-08-01 | Disposition: A | Discharge status: Home / Self Care | Payer: Worker's Compensation | Attending: Emergency Medicine | Admitting: Emergency Medicine

## 2012-08-01 DIAGNOSIS — S61209A Unspecified open wound of unspecified finger without damage to nail, initial encounter: Secondary | ICD-10-CM | POA: Insufficient documentation

## 2012-08-01 DIAGNOSIS — W268XXA Contact with other sharp object(s), not elsewhere classified, initial encounter: Secondary | ICD-10-CM | POA: Insufficient documentation

## 2012-08-01 DIAGNOSIS — I10 Essential (primary) hypertension: Secondary | ICD-10-CM | POA: Insufficient documentation

## 2012-08-01 DIAGNOSIS — E785 Hyperlipidemia, unspecified: Secondary | ICD-10-CM | POA: Insufficient documentation

## 2012-08-01 DIAGNOSIS — S61019A Laceration without foreign body of unspecified thumb without damage to nail, initial encounter: Secondary | ICD-10-CM

## 2012-08-01 MED ORDER — TETANUS-DIPHTH-ACELL PERTUSSIS 5-2.5-18.5 LF-MCG/0.5 IM SUSP
0.5000 mL | Freq: Once | INTRAMUSCULAR | Status: AC
  Administered 2012-08-01: 0.5 mL via INTRAMUSCULAR
  Filled 2012-08-01: qty 0.5

## 2012-08-01 NOTE — ED Provider Notes (Signed)
History     CSN: [id number]  Arrival date & time 08/01/12  2002   First MD Initiated Contact with Patient 08/01/12 2028      Chief Complaint  Patient presents with  . Laceration     Patient is a 51 y.o. male presenting with skin laceration. The history is provided by the patient.  Laceration  Incident onset: just prior to arrival. Pain location: right thumb. Size: 0.5cm. The laceration mechanism was a a metal edge. The pain is mild. The pain has been constant since onset. His tetanus status is unknown.  his symptoms are improving -  Reports bleeding is controlled by pressure No numbness/weakness reported in the thumb  Past Medical History  Diagnosis Date  . Hyperlipidemia   . Hypertension   . Asthma     only as child; resolved by age 8    Past Surgical History  Procedure Date  . Thumb surgery   . Orif toe fracture     Family History  Problem Relation Age of Onset  . Ulcers Mother   . Cirrhosis Mother     ? Alcohol Related   . Cancer Paternal Grandfather     CNS Cancer  . Panic disorder Other     Maternal FH  . Anxiety disorder Other     Maternal FH    History  Substance Use Topics  . Smoking status: Never Smoker   . Smokeless tobacco: Not on file   Comment: rare cigar  . Alcohol Use: 7.2 oz/week    12 Cans of beer per week     Social      Review of Systems  Skin: Positive for wound.  Neurological: Negative for weakness.    Allergies  Review of patient's allergies indicates no known allergies.  Home Medications   Current Outpatient Rx  Name Route Sig Dispense Refill  . BENADRYL PO Oral Take 2 tablets by mouth at bedtime.    Marland Kitchen EZETIMIBE 10 MG PO TABS Oral Take 10 mg by mouth daily.    Marland Kitchen FLUOXETINE HCL 10 MG PO CAPS Oral Take 1 capsule (10 mg total) by mouth daily. 90 capsule 1  . LISINOPRIL 10 MG PO TABS Oral Take 1 tablet (10 mg total) by mouth daily. 90 tablet 1  . ADULT MULTIVITAMIN W/MINERALS CH Oral Take 1 tablet by mouth daily.    .  OMEGA-3-ACID ETHYL ESTERS 1 G PO CAPS Oral Take 2 g by mouth daily.    Marland Kitchen ZETIA 10 MG PO TABS  TAKE ONE (1) TABLET EACH DAY 30 each 0    **LABS OVERDUE**    BP 142/96  Pulse 86  Resp 18  Ht 5\' 11"  (1.803 m)  Wt 250 lb (113.399 kg)  BMI 34.87 kg/m2  SpO2 100%  Physical Exam CONSTITUTIONAL: Well developed/well nourished HEAD AND FACE: Normocephalic/atraumatic EYES: EOMI ENMT: Mucous membranes moist NECK: supple no meningeal signs LUNGS:no apparent distress ABDOMEN: soft, nontender, no rebound or guarding GU:no cva tenderness NEURO: Pt is awake/alert, moves all extremitiesx4 EXTREMITIES: pulses normal, full ROM.  Small laceration to dorsal surface of right thumb on the DIP.  No active bleeding.  No bone or tendon exposed.   SKIN: warm, color normal PSYCH: no abnormalities of mood noted  ED Course  Procedures   LACERATION REPAIR Performed by: Joya Gaskins Consent: Verbal consent obtained. Risks and benefits: risks, benefits and alternatives were discussed Patient identity confirmed: provided demographic data Time out performed prior to procedure Prepped and Draped in  normal sterile fashion Wound explored Laceration Location: right thumb Laceration Length: 0.5cm No Foreign Bodies seen or palpated Irrigation method: tap water, betadine Amount of cleaning: standard Skin closure: simple Number of sutures or staples: dermabond Technique: dermond/tissue adhesive Patient tolerance: Patient tolerated the procedure well with no immediate complications.   1. Thumb laceration       MDM  Nursing notes including past medical history and social history reviewed and considered in documentation  Pt well appearing, bleeding controlled, wound cleansed, not contaminated.  Easily approximated by Diamond Nickel, MD 08/01/12 2056

## 2012-08-01 NOTE — ED Notes (Signed)
Pt has small cut to right thumb after cutting it on piece of metal while at work, bleeding controlled at this time.

## 2012-09-29 ENCOUNTER — Other Ambulatory Visit: Payer: Self-pay | Admitting: Internal Medicine

## 2012-09-29 LAB — LIPID PANEL
Total CHOL/HDL Ratio: 3.7 Ratio
VLDL: 20 mg/dL (ref 0–40)

## 2012-09-29 LAB — HEPATIC FUNCTION PANEL
AST: 37 U/L (ref 0–37)
Albumin: 4 g/dL (ref 3.5–5.2)
Alkaline Phosphatase: 71 U/L (ref 39–117)
Indirect Bilirubin: 0.6 mg/dL (ref 0.0–0.9)
Total Protein: 6.8 g/dL (ref 6.0–8.3)

## 2012-10-02 ENCOUNTER — Other Ambulatory Visit: Payer: Self-pay

## 2012-10-02 MED ORDER — EZETIMIBE 10 MG PO TABS: 10.0000 mg | ORAL_TABLET | Freq: Every day | ORAL | Status: DC

## 2012-10-02 NOTE — Telephone Encounter (Signed)
Pt wife called stating pt had cholesterol and hep panel at [facility] over the weekend and pt in need of Zetia. I called pt wife to verify pharmacy Rx sent.    MW

## 2012-10-12 ENCOUNTER — Telehealth: Payer: Self-pay

## 2012-10-12 NOTE — Telephone Encounter (Signed)
Betsy called [facility] for pt requesting to be mailed a Zetia discount card. Betsy verified address on VM. I mailed Zetia discount card out.    MW

## 2012-10-19 ENCOUNTER — Other Ambulatory Visit: Payer: Self-pay | Admitting: Internal Medicine

## 2012-11-12 ENCOUNTER — Other Ambulatory Visit: Payer: Self-pay | Admitting: Internal Medicine

## 2012-11-30 ENCOUNTER — Telehealth: Payer: Self-pay | Admitting: Internal Medicine

## 2012-11-30 NOTE — Telephone Encounter (Signed)
Hopp please advise  

## 2012-11-30 NOTE — Telephone Encounter (Signed)
Discuss with patient, wife who states that he has since spoken with his dentist and they have given him a antibiotic.

## 2012-11-30 NOTE — Telephone Encounter (Signed)
Liver enzymes were perfectly normal in November 2013. There is no risk of short-term Tylenol taken as per the package label. It's only an issue if taken  @ extremely high doses for prolonged periods of time especially in combination with excess alcohol and vitamin A.

## 2012-11-30 NOTE — Telephone Encounter (Signed)
Patient Information:  Caller Name: Arrian Manson  Phone: [phone]  Patient: Justin Townsend  Gender: Male  DOB: December 30, 1960  Age: 52 Years  PCP: Marga Melnick  Office Follow Up:  Does the office need to follow up with this patient?: Yes  Instructions For The Office: (Medication Advice- Adding Tylenol to Motrin dosages for Dental pain.  Due to live enzyme issues (2)  Any other advise or medication alternatives  RN Note:  requesting Dr. Caryl Never advise about adding Tylenol in with Motrin *Caution liver issues "fatty liver" and x3 blood work panels show liver enzymes elevated.  PLEASE CONTACT WIFE REGARDING DENTAL PAIN AND MEDICATIONS.  Dentist office opens on Monday.  Symptoms  Reason For Call & Symptoms: Wife is calling about her husband tooth.  He is having Lower right molar pain with filling. ? cracked.  She states dentist office is closed on Friday. Earliest appt is on Monday 12/03/12.  Wife is nurse, she states no swelling, no fever.  She is given Ibuprofen 800mg  every 6 hours. It is not "holding him: only last 4 hours.  She states that adding in tylenol is questionable due to liver issues.  Reviewed Health History In EMR: Yes  Reviewed Medications In EMR: Yes  Reviewed Allergies In EMR: Yes  Reviewed Surgeries / Procedures: No  Date of Onset of Symptoms: 11/27/2012  Treatments Tried: Ibuprofen 800mg , warm salt water and oragel  Treatments Tried Worked: No  Guideline(s) Used:  Toothache  Disposition Per Guideline:   Call Dentist Today  Reason For Disposition Reached:   Toothache present > 24 hours  Advice Given:  Pain Medicines:  For pain relief, you can take either acetaminophen, ibuprofen, or naproxen.  They are over-the-counter (OTC) pain drugs. You can buy them at the drugstore.  Call Your Dentist If:  Toothache lasts longer than 24 hours  The toothache becomes worse

## 2013-01-18 ENCOUNTER — Other Ambulatory Visit: Payer: Self-pay | Admitting: Internal Medicine

## 2013-03-11 ENCOUNTER — Other Ambulatory Visit: Payer: Self-pay | Admitting: Internal Medicine

## 2013-03-20 ENCOUNTER — Other Ambulatory Visit: Payer: Self-pay | Admitting: Internal Medicine

## 2013-04-29 ENCOUNTER — Other Ambulatory Visit: Payer: Self-pay | Admitting: Internal Medicine

## 2013-04-29 NOTE — Telephone Encounter (Signed)
Pending appointment 05/2013

## 2013-05-01 ENCOUNTER — Other Ambulatory Visit: Payer: Self-pay | Admitting: Internal Medicine

## 2013-06-05 ENCOUNTER — Telehealth: Payer: Self-pay | Admitting: Internal Medicine

## 2013-06-05 MED ORDER — AMBULATORY NON FORMULARY MEDICATION: Status: DC

## 2013-06-05 NOTE — Telephone Encounter (Signed)
Order placed at the front for pick-up, patient's wife aware

## 2013-06-05 NOTE — Telephone Encounter (Signed)
Patient's wife called stating the patient usually has outside labs done for his physical. She would like to pick the written orders up on Friday. Call [facility] [phone] when ready for pick up.

## 2013-06-08 ENCOUNTER — Other Ambulatory Visit: Payer: Self-pay | Admitting: Internal Medicine

## 2013-06-08 LAB — HEPATIC FUNCTION PANEL
AST: 44 U/L — ABNORMAL HIGH (ref 0–37)
Albumin: 4.1 g/dL (ref 3.5–5.2)
Bilirubin, Direct: 0.1 mg/dL (ref 0.0–0.3)
Total Bilirubin: 0.6 mg/dL (ref 0.3–1.2)

## 2013-06-08 LAB — CBC WITH DIFFERENTIAL/PLATELET
Eosinophils Absolute: 0.2 10*3/uL (ref 0.0–0.7)
Eosinophils Relative: 2 % (ref 0–5)
HCT: 47.2 % (ref 39.0–52.0)
Hemoglobin: 16.3 g/dL (ref 13.0–17.0)
Lymphocytes Relative: 36 % (ref 12–46)
Lymphs Abs: 2.4 10*3/uL (ref 0.7–4.0)
MCH: 30.6 pg (ref 26.0–34.0)
MCV: 88.6 fL (ref 78.0–100.0)
Monocytes Relative: 9 % (ref 3–12)
RBC: 5.33 MIL/uL (ref 4.22–5.81)
WBC: 6.7 10*3/uL (ref 4.0–10.5)

## 2013-06-08 LAB — LIPID PANEL
HDL: 43 mg/dL (ref >39)
LDL Cholesterol: 87 mg/dL (ref 0–99)
Total CHOL/HDL Ratio: 3.6 Ratio

## 2013-06-08 LAB — TSH: TSH: 1.414 u[IU]/mL (ref 0.350–4.500)

## 2013-06-08 LAB — BASIC METABOLIC PANEL
CO2: 25 mEq/L (ref 19–32)
Calcium: 8.8 mg/dL (ref 8.4–10.5)
Chloride: 106 mEq/L (ref 96–112)
Creat: 0.84 mg/dL (ref 0.50–1.35)
Glucose, Bld: 85 mg/dL (ref 70–99)

## 2013-06-14 ENCOUNTER — Other Ambulatory Visit: Payer: Self-pay | Admitting: *Deleted

## 2013-06-14 MED ORDER — LISINOPRIL 10 MG PO TABS: ORAL_TABLET | ORAL | Status: DC

## 2013-06-20 ENCOUNTER — Ambulatory Visit (INDEPENDENT_AMBULATORY_CARE_PROVIDER_SITE_OTHER): Payer: BC Managed Care – PPO | Admitting: Internal Medicine

## 2013-06-20 ENCOUNTER — Encounter: Payer: Self-pay | Admitting: Internal Medicine

## 2013-06-20 VITALS — BP 130/90 | HR 68 | Temp 98.2°F | Wt 256.2 lb

## 2013-06-20 DIAGNOSIS — L255 Unspecified contact dermatitis due to plants, except food: Secondary | ICD-10-CM

## 2013-06-20 DIAGNOSIS — Z0000 Encounter for general adult medical examination without abnormal findings: Secondary | ICD-10-CM

## 2013-06-20 MED ORDER — MOMETASONE FUROATE 0.1 % EX OINT: TOPICAL_OINTMENT | Freq: Two times a day (BID) | CUTANEOUS | Status: DC

## 2013-06-20 NOTE — Patient Instructions (Addendum)
As per the Standard of Care , screening Colonoscopy recommended @ 50 & every 5-10 years thereafter . More frequent monitor would be dictated by family history or findings @ Colonoscopy Minimal Blood Pressure Goal= AVERAGE < 140/90;  Ideal is an AVERAGE < 135/85. This AVERAGE should be calculated from @ least 5-7 BP readings taken @ different times of day on different days of week. You should not respond to isolated BP readings , but rather the AVERAGE for that week .Please bring your  blood pressure cuff to office visits to verify that it is reliable.It  can also be checked against the blood pressure device at the pharmacy. Finger or wrist cuffs are not dependable; an arm cuff is. Mild elevation of 2 liver enzyme tests; avoid excess Tylenol, alcohol & vitamin A.Recheck fasting AST & ALT  in 3-4 months. Code: 790.4.    Exercise at least 30-45 minutes a day,  3-4 days a week.  Eat a low-fat diet with lots of fruits and vegetables, up to 7-9 servings per day. This would eliminate the need for vitamin supplements. Consume less than 40 grams of sugar (preferably ZERO) per day from foods & drinks with High Fructose Corn Sugar as #1,2,3 or # 4 on label. Health Care Power of Attorney & Living Will. Complete these if not in place ; these place you in charge of your health care decisions.  If you activate the  My Chart system; lab & Xray results will be released directly  to you as soon as I review & address these through the computer. If you choose not to sign up for My Chart within 36 hours of labs being drawn; results will be reviewed & interpretation added before being copied & mailed, causing a delay in getting the results to you.If you do not receive that report within 7-10 days ,please call. Additionally you can use this system to gain direct  access to your records  if  out of town or @ an office of a  physician who is not in  the My Chart network.  This improves continuity of care & places you in control  of your medical record.

## 2013-06-20 NOTE — Progress Notes (Signed)
Subjective:    Patient ID: Justin Townsend, male    DOB: 1961/01/23, 52 y.o.   MRN: [id number]  HPI  He is here for a physical;acute issues include intermittent discomfort in the left medial  & anterior thigh  area during the work week. 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     Review of Systems He is on a modified heart healthy diet; he is not  exercising .He denies chest pain, palpitations, dyspnea, or claudication. Family history is negative for premature coronary disease. Advanced cholesterol testing reveals his LDL goal was less than 120. BP not monitored @ home.  He has some mild poison ivy rash over the lower extremity after mowing his neighbors yard     Objective:   Physical Exam Gen.: Healthy and well-nourished in appearance. Alert, appropriate and cooperative throughout exam. Appears younger than stated age  Head: Normocephalic without obvious abnormalities; no alopecia  Eyes: No corneal or conjunctival inflammation noted. Pupils equal round reactive to light and accommodation.  Extraocular motion intact. Vision grossly decreased OD even with lenses Ears: External  ear exam reveals no significant lesions or deformities. Canals clear .TMs normal.  Nose: External nasal exam reveals no deformity or inflammation. Nasal mucosa are pink and moist. No lesions or exudates noted.  Mouth: Oral mucosa and oropharynx reveal no lesions or exudates. Teeth in good repair. Neck: No deformities, masses, or tenderness noted. Range of motion & Thyroid normal. Lungs: Normal respiratory effort; chest expands symmetrically. Lungs are clear to auscultation without rales, wheezes, or increased work of breathing. Heart: Normal rate and rhythm. Normal S1 ;accentuated S2. No gallop, click, or rub. No murmur. Abdomen: Bowel sounds normal; abdomen soft and nontender. No masses, organomegaly or  hernias noted. Genitalia: Genitalia normal except for left varices. Prostate is normal without enlargement, asymmetry, nodularity, or induration.                                    Musculoskeletal/extremities: No deformity or scoliosis noted of  the thoracic or lumbar spine.  No clubbing, cyanosis, edema, or significant extremity  deformity noted. Range of motion normal .Tone & strength  Normal. Joints normal. Nail health good. Able to lie down & sit up w/o help. Negative SLR bilaterally Vascular: Carotid, radial artery, dorsalis pedis and  posterior tibial pulses are full and equal. No bruits present. Neurologic: Alert and oriented x3. Deep tendon reflexes symmetrical and normal.  Gait normal  including heel & toe walking .        Skin: Intact without suspicious lesions.Rhus dermatitis LUE. Lymph: No cervical, axillary, or inguinal lymphadenopathy present. Psych: Mood and affect are normal. Normally interactive                                                                                      Assessment & Plan:  #1 comprehensive physical exam; no acute findings #2 repetitive motion pain L thigh ; stretching exercises recommended #3 Rhus dermatitis  Plan:  see Orders  & Recommendations

## 2013-07-25 ENCOUNTER — Other Ambulatory Visit: Payer: Self-pay | Admitting: Internal Medicine

## 2013-07-25 DIAGNOSIS — F411 Generalized anxiety disorder: Secondary | ICD-10-CM

## 2013-07-25 NOTE — Telephone Encounter (Signed)
rx refilled per protocol. DJR  

## 2013-08-29 ENCOUNTER — Encounter: Payer: Self-pay | Admitting: Internal Medicine

## 2013-12-04 ENCOUNTER — Other Ambulatory Visit: Payer: Self-pay | Admitting: Internal Medicine

## 2013-12-04 NOTE — Telephone Encounter (Signed)
Zetia refilled per protocol. JG//CMA 

## 2013-12-07 ENCOUNTER — Other Ambulatory Visit: Payer: Self-pay | Admitting: Internal Medicine

## 2013-12-09 NOTE — Telephone Encounter (Signed)
Lisinopril refilled per protocol. JG//CMA 

## 2013-12-11 ENCOUNTER — Telehealth: Payer: Self-pay | Admitting: Internal Medicine

## 2013-12-11 NOTE — Telephone Encounter (Signed)
Called and spoke with patient's wife to inform her that I have left samples and discount card for Zetia at front desk. JG//CMA

## 2013-12-11 NOTE — Telephone Encounter (Signed)
Patient's wife is calling on the patient's behalf to request another Zetia discount card if available. The card they currently have is expired. She says they are completely out it is too expensive at the pharmacy. They will take a sample to if available.

## 2014-02-10 ENCOUNTER — Telehealth: Payer: Self-pay | Admitting: *Deleted

## 2014-02-10 ENCOUNTER — Other Ambulatory Visit: Payer: Self-pay | Admitting: *Deleted

## 2014-02-10 DIAGNOSIS — F411 Generalized anxiety disorder: Secondary | ICD-10-CM

## 2014-02-10 MED ORDER — FLUOXETINE HCL 10 MG PO CAPS: ORAL_CAPSULE | ORAL | Status: DC

## 2014-02-10 MED ORDER — FLUOXETINE HCL 10 MG PO CAPS
ORAL_CAPSULE | ORAL | Status: DC
Start: 2014-02-10 — End: 2014-02-10

## 2014-02-10 NOTE — Telephone Encounter (Signed)
[facility] Triage Call Report Triage Record Num: [id number] Operator: Al CorpusKristen Binkney Patient Name: Justin Townsend Call Date & Time: 02/09/2014 11:31:05AM Patient Phone: [phone] PCP: Marga MelnickWilliam Hopper Patient Gender: Male PCP Fax : [phone] Patient DOB: [birth year] [facility] Name: [facility] Reason for Call: Caller: Mat CarneElizabeth/Spouse; PCP: Marga MelnickHopper, William; CB#: [phone]; Call regarding Medication Issue; Medication(s): Prozac ; States they requested a RF on his Prozac on Thursday 3/19 and went to pharm to pick it up today and pharm said was denied by MD and they cannot give emergency supply b/c note on last script said needs appt for further RFs. Triage RN reviewed EMR and confirmed that appt needed per MD note on script. Advised to call office first thing tomorrow morning during reg hours to f/u. Agreed to plan. Protocol(s) Used: Office Note Recommended Outcome per Protocol: Information Noted and Sent to Office Reason for Outcome: Caller information to office

## 2014-02-10 NOTE — Telephone Encounter (Signed)
Rx sent to the pharmacy by e-script.  Pt needs complete physical and fasting labs.//AB/CMA 

## 2014-02-10 NOTE — Telephone Encounter (Signed)
OK for refill; he had CPX in 7/14. I had not seen this request before

## 2014-03-07 ENCOUNTER — Encounter: Payer: Self-pay | Admitting: Internal Medicine

## 2014-03-07 ENCOUNTER — Other Ambulatory Visit (INDEPENDENT_AMBULATORY_CARE_PROVIDER_SITE_OTHER): Payer: BC Managed Care – PPO

## 2014-03-07 ENCOUNTER — Ambulatory Visit (INDEPENDENT_AMBULATORY_CARE_PROVIDER_SITE_OTHER): Payer: BC Managed Care – PPO | Admitting: Internal Medicine

## 2014-03-07 VITALS — BP 126/90 | HR 66 | Temp 98.2°F | Resp 13 | Wt 259.0 lb

## 2014-03-07 DIAGNOSIS — R740 Nonspecific elevation of levels of transaminase and lactic acid dehydrogenase [LDH]: Secondary | ICD-10-CM

## 2014-03-07 DIAGNOSIS — E782 Mixed hyperlipidemia: Secondary | ICD-10-CM

## 2014-03-07 DIAGNOSIS — R7401 Elevation of levels of liver transaminase levels: Secondary | ICD-10-CM

## 2014-03-07 DIAGNOSIS — I10 Essential (primary) hypertension: Secondary | ICD-10-CM

## 2014-03-07 DIAGNOSIS — R7402 Elevation of levels of lactic acid dehydrogenase (LDH): Secondary | ICD-10-CM

## 2014-03-07 LAB — LIPID PANEL
Cholesterol: 149 mg/dL (ref 0–200)
HDL: 48.1 mg/dL (ref >39.00)
LDL CALC: 88 mg/dL (ref 0–99)
TRIGLYCERIDES: 64 mg/dL (ref 0.0–149.0)
Total CHOL/HDL Ratio: 3
VLDL: 12.8 mg/dL (ref 0.0–40.0)

## 2014-03-07 LAB — BASIC METABOLIC PANEL
BUN: 12 mg/dL (ref 6–23)
CO2: 28 mEq/L (ref 19–32)
Calcium: 9.1 mg/dL (ref 8.4–10.5)
Chloride: 107 mEq/L (ref 96–112)
Creatinine, Ser: 0.8 mg/dL (ref 0.4–1.5)
GFR: 110.91 mL/min (ref >60.00)
Glucose, Bld: 96 mg/dL (ref 70–99)
POTASSIUM: 4.3 meq/L (ref 3.5–5.1)
Sodium: 140 mEq/L (ref 135–145)

## 2014-03-07 LAB — HEPATIC FUNCTION PANEL
ALK PHOS: 64 U/L (ref 39–117)
ALT: 60 U/L — AB (ref 0–53)
AST: 42 U/L — ABNORMAL HIGH (ref 0–37)
Albumin: 3.8 g/dL (ref 3.5–5.2)
BILIRUBIN TOTAL: 0.6 mg/dL (ref 0.3–1.2)
Bilirubin, Direct: 0.1 mg/dL (ref 0.0–0.3)
Total Protein: 7.2 g/dL (ref 6.0–8.3)

## 2014-03-07 LAB — TSH: TSH: 1.31 u[IU]/mL (ref 0.35–5.50)

## 2014-03-07 NOTE — Progress Notes (Signed)
Pre visit review using our clinic review tool, if applicable. No additional management support is needed unless otherwise documented below in the visit note. 

## 2014-03-07 NOTE — Progress Notes (Signed)
   Subjective:    Patient ID: Justin Townsend, male    DOB: 1961/10/17, 53 y.o.   MRN: [id number]  HPI  He is here for lipid follow-up.   He reports a resolving knee injury from approximately 3 weeks ago when he was carrying siding over his shoulder and hit right knee on a crate.  He has had relief with 800 mg ibuprofen and wearing a sleeve brace. He feels the injury is resolving, rating the pain 2/10 from 9/10 after the incident.    Review of Systems A modified heart healthy diet is followed; no exercise regimen followed.  Family history is positive for premature coronary disease. No advanced cholesterol testing to date. There is medication compliance with the statin.  Low dose ASA is not taken Specifically denied are  chest pain, palpitations, dyspnea, or claudication.  Significant abdominal symptoms, memory deficit, or myalgias not present.    Objective:   Physical Exam Gen.: Healthy and well-nourished in appearance. Alert, appropriate and cooperative throughout exam. Appears younger than stated age  Head: Normocephalic without obvious abnormalities; no alopecia  Eyes: No corneal or conjunctival inflammation noted. Pupils equal round reactive to light and accommodation. Extraocular motion intact. Vision grossly normal with lenses. Ears: External  ear exam reveals no significant lesions or deformities. Canals clear .TMs normal. Hearing is grossly normal bilaterally. Nose: External nasal exam reveals no deformity or inflammation. Nasal mucosa are pink and moist. No lesions or exudates noted.   Mouth: Oral mucosa and oropharynx reveal no lesions or exudates. Teeth in good repair. Neck: No deformities, masses, or tenderness noted. Range of motion WNL. Thyroid palpable. Lungs: Normal respiratory effort; chest expands symmetrically. Lungs are clear to auscultation without rales, wheezes, or increased work of breathing. Heart: Normal rate and rhythm. Normal S1 and S2. No gallop, click, or  rub. No murmur. Abdomen: Bowel sounds normal; abdomen soft and nontender. No masses, organomegaly or hernias noted. Musculoskeletal/extremities: No deformity or scoliosis noted of  the thoracic or lumbar spine.   OR Accentuated curvature of upper thoracic spine. OR There is some asymmetry of the posterior thoracic musculature suggesting occult scoliosis. No clubbing, cyanosis, edema, or significant extremity  deformity noted. Range of motion normal .Tone & strength normal. Hand joints normal OR reveal mild  DJD DIP changes.  Fingernail / toenail health good. Able to lie down & sit up w/o help. Negative SLR bilaterally Vascular: Carotid, radial artery, dorsalis pedis and  posterior tibial pulses are full and equal. No bruits present. Neurologic: Alert and oriented x3. Deep tendon reflexes symmetrical and normal.  Gait normal  including heel & toe walking . Rhomberg & finger to nose       Skin: Intact without suspicious lesions or rashes. Lymph: No cervical, axillary, or inguinal lymphadenopathy present. Psych: Mood and affect are normal. Normally interactive                                                                                   Assessment & Plan:  #1 hyperlipidemia - BMP, Hep panel, NMR?, TSH #2 resolving R knee injury - RTC if symptoms persists > 3-4 weeks.

## 2014-03-07 NOTE — Progress Notes (Signed)
   Subjective:    Patient ID: Justin Townsend, male    DOB: [birth year], 53 y.o.   MRN: [id number]  HPI He is here for lipid follow-up.  His last Lipid Panel was 06/08/13 with the following results:  LDL 87, TG 130, HDL, 43  He reports a resolving knee injury from approximately 3 weeks ago when he was carrying siding over his shoulder and hit right knee on a crate.  He has had relief with 800 mg ibuprofen and wearing a sleeve brace. He feels the injury is resolving, rating the pain 2/10 from 9/10 after the incident.  A modified heart healthy diet is followed; no exercise regimen followed.  Family history is positive for premature coronary disease.  Advanced cholesterol testing goals: LDL < 120,ideally < 90  There is medication compliance with the Zetia.  Low dose ASA is not taken   Review of Systems Specifically denied are chest pain, palpitations, dyspnea, or claudication.  Significant abdominal symptoms, memory deficit, or myalgias not present. He denies unexplained weight loss, abdominal pain, significant dyspepsia, dysphagia, melena, rectal bleeding, or persistently small caliber stools. Colonoscopy overdue ; SOC reviewed.     Objective:   Physical Exam  Gen.: Healthy and well-nourished in appearance. Alert, appropriate and cooperative throughout exam.  Appears younger than stated age  Head: Normocephalic without obvious abnormalities; no alopecia  Eyes: No corneal or conjunctival inflammation noted. Pupils equal round reactive to light and accommodation. Extraocular motion intact. Vision grossly normal with lenses.  Ears: External ear exam reveals no significant lesions or deformities. Canals clear .TMs normal. Hearing is grossly normal bilaterally.  Nose: External nasal exam reveals no deformity or inflammation. Nasal mucosa are pink and moist. No lesions or exudates noted.  Mouth: Oral mucosa and oropharynx reveal no lesions or exudates. Teeth in good repair.  Neck: No  deformities, masses, or tenderness noted. Range of motion WNL. Thyroid normal.  Lungs: Normal respiratory effort; chest expands symmetrically. Lungs are clear to auscultation without rales, wheezes, or increased work of breathing.  Heart: Normal rate and rhythm. Normal S1 and S2. No gallop, click, or rub. No murmur.  Abdomen: Bowel sounds normal; abdomen soft and nontender. No masses, organomegaly or hernias noted.  Musculoskeletal/extremities: No deformity or scoliosis noted of the thoracic or lumbar spine.  No clubbing, cyanosis, edema, or significant extremity deformity noted. Range of motion normal.Tone & strength normal.  Hand joints normal.  R knee: no effusion, edema, redness at joint. No tenderness with palpation; Full ROM; equal strength. No crepitus.  Fingernail / toenail health good.  Able to lie down & sit up w/o help. Negative SLR bilaterally  GU: normal including DRE Vascular: Carotid, radial artery, dorsalis pedis and posterior tibial pulses are full and equal. No bruits present.  Neurologic: Alert and oriented x3. Deep tendon reflexes symmetrical and normal.  Gait normal including heel & toe walking. Skin: Intact without suspicious lesions or rashes.  Lymph: No cervical, axillary, or inguinal lymphadenopathy present.  Psych: Mood and affect are normal. Normally interactive     Assessment & Plan:  #1 hyperlipidemia - BMP, Hep panel, lipid panel, TSH  #2 resolving R knee injury - RTC if symptoms persists > 3-4 weeks. #3 elevated LFT ,PMH of #4 HTN

## 2014-03-07 NOTE — Patient Instructions (Addendum)
Your next office appointment will be determined based upon review of your pending labs. Those instructions will be transmitted to you through My Chart . Minimal Blood Pressure Goal= AVERAGE < 140/90;  Ideal is an AVERAGE < 135/85. This AVERAGE should be calculated from @ least 5-7 BP readings taken @ different times of day on different days of week. You should not respond to isolated BP readings , but rather the AVERAGE for that week .Please bring your  blood pressure cuff to office visits to verify that it is reliable.It  can also be checked against the blood pressure device at the pharmacy. Finger or wrist cuffs are not dependable; an arm cuff is. As per the Standard of Care , screening Colonoscopy recommended @ 50 & every 5-10 years thereafter . More frequent monitor would be dictated by family history or findings @ Colonoscopy

## 2014-03-13 ENCOUNTER — Encounter: Payer: Self-pay | Admitting: Internal Medicine

## 2014-05-26 ENCOUNTER — Encounter: Payer: Self-pay | Admitting: Internal Medicine

## 2014-07-30 ENCOUNTER — Other Ambulatory Visit: Payer: Self-pay

## 2014-07-30 MED ORDER — EZETIMIBE 10 MG PO TABS: ORAL_TABLET | ORAL | Status: DC

## 2014-08-01 ENCOUNTER — Other Ambulatory Visit: Payer: Self-pay

## 2014-08-01 MED ORDER — LISINOPRIL 10 MG PO TABS: ORAL_TABLET | ORAL | Status: DC

## 2014-08-11 ENCOUNTER — Encounter: Payer: Self-pay | Admitting: Internal Medicine

## 2014-08-11 ENCOUNTER — Ambulatory Visit (INDEPENDENT_AMBULATORY_CARE_PROVIDER_SITE_OTHER): Payer: BC Managed Care – PPO | Admitting: Internal Medicine

## 2014-08-11 VITALS — BP 128/76 | HR 72 | Ht 70.75 in | Wt 262.4 lb

## 2014-08-11 DIAGNOSIS — F419 Anxiety disorder, unspecified: Secondary | ICD-10-CM

## 2014-08-11 DIAGNOSIS — K7689 Other specified diseases of liver: Secondary | ICD-10-CM

## 2014-08-11 DIAGNOSIS — K760 Fatty (change of) liver, not elsewhere classified: Secondary | ICD-10-CM

## 2014-08-11 DIAGNOSIS — R1310 Dysphagia, unspecified: Secondary | ICD-10-CM

## 2014-08-11 DIAGNOSIS — Z1211 Encounter for screening for malignant neoplasm of colon: Secondary | ICD-10-CM

## 2014-08-11 DIAGNOSIS — F411 Generalized anxiety disorder: Secondary | ICD-10-CM

## 2014-08-11 MED ORDER — MOVIPREP 100 G PO SOLR: 1.0000 | Freq: Once | ORAL | Status: DC

## 2014-08-11 MED ORDER — DIAZEPAM 5 MG PO TABS: ORAL_TABLET | ORAL | Status: DC

## 2014-08-11 NOTE — Patient Instructions (Signed)
We have sent the following medications to your pharmacy for you to pick up at your convenience: Valium  You have been scheduled for an endoscopy and colonoscopy. Please follow the written instructions given to you at your visit today. Please pick up your prep at the pharmacy within the next 1-3 days. If you use inhalers (even only as needed), please bring them with you on the day of your procedure. Your physician has requested that you go to www.startemmi.com and enter the access code given to you at your visit today. This web site gives a general overview about your procedure. However, you should still follow specific instructions given to you by our office regarding your preparation for the procedure.

## 2014-08-11 NOTE — Progress Notes (Signed)
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 He is accompanied by Justin Townsend. First, the patient reports an 18 month history of intermittent solid food dysphagia to item such as rice. He denies heartburn or indigestion. 10 pound weight gain. No family history of esophageal cancer. No vomiting. No melena. Next, he is interested in screening colonoscopy. No family history of colon cancer. No lower GI complaints except for intermittent irritation from hemorrhoids. His GI review of systems is otherwise remarkable for mild elevation of liver tests secondary to fatty liver. Review of outside laboratories from April 2015 signs mild elevation of hepatic transaminases. Otherwise normal chemistries, lipids, and thyroid stimulating hormone. Ultrasound from 2005 demonstrates fatty liver as the CT scan from that same year.  REVIEW OF SYSTEMS:  All non-GI ROS negative except for sinus and allergy, anxiety  Past Medical History  Diagnosis Date  . Hyperlipidemia   . Hypertension   . Asthma     only as child; resolved by age 24  . Gallstones     Past Surgical History  Procedure Laterality Date  . Thumb surgery Left     pinning post dislocation  . Orif toe fracture Right     foot  . No colonoscopy      SOC reviewed    Social History Justin Townsend  reports that he has never smoked. He has never used smokeless tobacco. He reports that he drinks about 6 ounces of alcohol per week. He reports that he does not use illicit drugs.  family history includes Anxiety disorder in his other; Cancer in his paternal grandfather; Cirrhosis in his mother; Heart attack in his maternal grandfather; Panic disorder in his other; Ulcers in his mother. There is no history of Diabetes, Stroke, Colon cancer, or Colon polyps.  No Known Allergies     PHYSICAL EXAMINATION: Vital signs: BP 128/76  Pulse 72  Ht 5' 10.75"  (1.797 m)  Wt 262 lb 6 oz (119.013 kg)  BMI 36.86 kg/m2  Constitutional: generally well-appearing, no acute distress Psychiatric: alert and oriented x3, cooperative Eyes: extraocular movements intact, anicteric, conjunctiva pink Mouth: oral pharynx moist, no lesions Neck: supple no lymphadenopathy Cardiovascular: heart regular rate and rhythm, no murmur Lungs: clear to auscultation bilaterally Abdomen: soft, nontender, nondistended, no obvious ascites, no peritoneal signs, normal bowel sounds, no organomegaly Rectal: Deferred until colonoscopy Extremities: no lower extremity edema bilaterally Skin: no lesions on visible extremities Neuro: No focal deficits.   ASSESSMENT:  #1. 18 month history of worsening intermittent solid food dysphagia. Rule out ring or stricture. #2. Colon cancer screening. Baseline risk. Appropriate candidate without contraindication #3. Fatty liver with associated imaging abnormalities and mild elevation of hepatic transaminases #4. Anxiety. Request something to take before procedure preparation   PLAN:  #1. Upper endoscopy with possible esophageal dilation.The nature of the procedure, as well as the risks, benefits, and alternatives were carefully and thoroughly reviewed with the patient. Ample time for discussion and questions allowed. The patient understood, was satisfied, and agreed to proceed. #2. Colonoscopy.The nature of the procedure, as well as the risks, benefits, and alternatives were carefully and thoroughly reviewed with the patient. Ample time for discussion and questions allowed. The patient understood, was satisfied, and agreed to proceed. Movi prep prescribed. Patient instructed on its use #3. Prescribe Valium 5 mg x1. To be taken before initiating preparation procedure. Don't drive yourself to the appointment #4.  Exercise and weight loss as treatment for fatty liver

## 2014-08-20 ENCOUNTER — Encounter: Payer: Self-pay | Admitting: Internal Medicine

## 2014-09-09 ENCOUNTER — Encounter: Payer: Self-pay | Admitting: Internal Medicine

## 2014-09-09 ENCOUNTER — Ambulatory Visit (INDEPENDENT_AMBULATORY_CARE_PROVIDER_SITE_OTHER): Payer: BC Managed Care – PPO | Admitting: Internal Medicine

## 2014-09-09 VITALS — BP 118/84 | HR 68 | Temp 98.3°F | Resp 12 | Wt 256.5 lb

## 2014-09-09 DIAGNOSIS — J988 Other specified respiratory disorders: Principal | ICD-10-CM

## 2014-09-09 DIAGNOSIS — R05 Cough: Secondary | ICD-10-CM

## 2014-09-09 DIAGNOSIS — R059 Cough, unspecified: Secondary | ICD-10-CM

## 2014-09-09 DIAGNOSIS — B349 Viral infection, unspecified: Secondary | ICD-10-CM

## 2014-09-09 DIAGNOSIS — B9789 Other viral agents as the cause of diseases classified elsewhere: Secondary | ICD-10-CM

## 2014-09-09 DIAGNOSIS — H15002 Unspecified scleritis, left eye: Secondary | ICD-10-CM

## 2014-09-09 MED ORDER — HYDROCODONE-HOMATROPINE 5-1.5 MG/5ML PO SYRP: 5.0000 mL | ORAL_SOLUTION | Freq: Four times a day (QID) | ORAL | Status: DC | PRN

## 2014-09-09 MED ORDER — AZITHROMYCIN 250 MG PO TABS: ORAL_TABLET | ORAL | Status: DC

## 2014-09-09 MED ORDER — ERYTHROMYCIN 5 MG/GM OP OINT: TOPICAL_OINTMENT | OPHTHALMIC | Status: DC

## 2014-09-09 NOTE — Patient Instructions (Signed)
Plain Mucinex (NOT D) for thick secretions ;force NON dairy fluids .   Nasal cleansing in the shower as discussed with lather of mild shampoo.After 10 seconds wash off lather while  exhaling through nostrils. Make sure that all residual soap is removed to prevent irritation.  Flonase OR Nasacort AQ 1 spray in each nostril twice a day as needed. Use the "crossover" technique into opposite nostril spraying toward opposite ear @ 45 degree angle, not straight up into nostril.  Use a Neti pot daily only  as needed for significant sinus congestion; going from open side to congested side . Plain Allegra (NOT D )  160 daily , Loratidine 10 mg , OR Zyrtec 10 mg @ bedtime  as needed for itchy eyes & sneezing. Fill the  prescription for Zithromax it there is not dramatic improvement in the next 48 hours in chest symptoms. Fill the  prescription for Ophthalmic (eye) antibiotic it there is not dramatic improvement in the next 36-48 hours in eye symptoms.

## 2014-09-09 NOTE — Progress Notes (Signed)
   Subjective:    Patient ID: Justin Townsend, male    DOB: [birth year], 53 y.o.   MRN: [id number]  HPI  Symptoms began 09/04/14 as throat congestion and nasal congestion. He subsequently developed a nonproductive cough particularly at night. In the morning he has brought up a scant amount of brown material. He's had some associated pressure in the paranasal area.  He's been using Advil, Mucinex, NyQuil.  For several days he's also noticed some redness in the left eye which he attributes to his contact. There's been no associated pain with extraocular motion, vision loss or purulent discharge.    Review of Systems Frontal headache, facial pain , nasal purulence, dental pain, sore throat , otic pain or otic discharge denied. No fever , chills or sweats.     Objective:   Physical Exam   Positive or pertinent findings include: There is marked scleritis on the left. Extraocular motion is intact, as is vision. There is no purulence. There is marked erythema of the nasal septum without associated exudate or other lesions.  General appearance:good health ;well nourished; no acute distress or increased work of breathing is present.  No  lymphadenopathy about the head, neck, or axilla noted.  Ears:  External ear exam shows no significant lesions or deformities.  Otoscopic examination reveals clear canals, tympanic membranes are intact bilaterally without bulging, retraction, inflammation or discharge. Nose:  External nasal examination shows no deformity or inflammation. No septal dislocation or deviation.No obstruction to airflow.  Oral exam: Dental hygiene is good; lips and gums are healthy appearing.There is no oropharyngeal erythema or exudate noted.  Neck:  No deformities, thyromegaly, masses, or tenderness noted.   Supple with full range of motion without pain.  Heart:  Normal rate and regular rhythm. S1 and S2 normal without gallop, murmur, click, rub or other extra sounds.  Lungs:Chest clear  to auscultation; no wheezes, rhonchi,rales ,or rubs present.No increased work of breathing.   Extremities:  No cyanosis, edema, or clubbing  noted  Skin: Warm & dry w/o jaundice or tenting.         Assessment & Plan:  #1 viral upper respiratory infection without pharyngitis  Plan: See orders and recommendations

## 2014-09-09 NOTE — Progress Notes (Signed)
Pre visit review using our clinic review tool, if applicable. No additional management support is needed unless otherwise documented below in the visit note. 

## 2014-09-16 ENCOUNTER — Encounter: Payer: Self-pay | Admitting: Internal Medicine

## 2014-10-08 ENCOUNTER — Ambulatory Visit: Payer: BC Managed Care – PPO | Admitting: Internal Medicine

## 2014-10-20 ENCOUNTER — Encounter: Payer: Self-pay | Admitting: Internal Medicine

## 2014-10-23 ENCOUNTER — Encounter: Payer: Self-pay | Admitting: Internal Medicine

## 2015-01-30 ENCOUNTER — Other Ambulatory Visit: Payer: Self-pay | Admitting: *Deleted

## 2015-01-30 MED ORDER — EZETIMIBE 10 MG PO TABS: ORAL_TABLET | ORAL | Status: DC

## 2015-02-19 ENCOUNTER — Other Ambulatory Visit: Payer: Self-pay

## 2015-02-19 ENCOUNTER — Other Ambulatory Visit: Payer: Self-pay | Admitting: Internal Medicine

## 2015-02-19 MED ORDER — EZETIMIBE 10 MG PO TABS: ORAL_TABLET | ORAL | Status: DC

## 2015-02-23 ENCOUNTER — Other Ambulatory Visit: Payer: Self-pay | Admitting: Internal Medicine

## 2015-02-24 NOTE — Telephone Encounter (Signed)
OK x 1 Rec CPX; last 10/14

## 2015-03-09 ENCOUNTER — Encounter: Payer: Self-pay | Admitting: Internal Medicine

## 2015-06-04 ENCOUNTER — Other Ambulatory Visit: Payer: Self-pay | Admitting: Internal Medicine

## 2015-08-10 ENCOUNTER — Telehealth: Payer: Self-pay | Admitting: Emergency Medicine

## 2015-08-10 ENCOUNTER — Other Ambulatory Visit: Payer: Self-pay | Admitting: Internal Medicine

## 2015-08-10 DIAGNOSIS — R740 Nonspecific elevation of levels of transaminase and lactic acid dehydrogenase [LDH]: Secondary | ICD-10-CM

## 2015-08-10 DIAGNOSIS — I10 Essential (primary) hypertension: Secondary | ICD-10-CM

## 2015-08-10 DIAGNOSIS — E785 Hyperlipidemia, unspecified: Secondary | ICD-10-CM

## 2015-08-10 DIAGNOSIS — R7401 Elevation of levels of liver transaminase levels: Secondary | ICD-10-CM

## 2015-08-10 NOTE — Telephone Encounter (Signed)
Pt would like labs done before appt on 08/12/15. Please advise

## 2015-08-12 ENCOUNTER — Ambulatory Visit: Payer: Self-pay | Admitting: Internal Medicine

## 2015-08-12 DIAGNOSIS — R4689 Other symptoms and signs involving appearance and behavior: Secondary | ICD-10-CM | POA: Insufficient documentation

## 2015-08-16 ENCOUNTER — Encounter: Payer: Self-pay | Admitting: Internal Medicine

## 2015-09-10 ENCOUNTER — Other Ambulatory Visit: Payer: Self-pay | Admitting: Internal Medicine

## 2015-09-11 ENCOUNTER — Ambulatory Visit (INDEPENDENT_AMBULATORY_CARE_PROVIDER_SITE_OTHER): Payer: BLUE CROSS/BLUE SHIELD | Admitting: Internal Medicine

## 2015-09-11 ENCOUNTER — Encounter: Payer: Self-pay | Admitting: Internal Medicine

## 2015-09-11 ENCOUNTER — Other Ambulatory Visit (INDEPENDENT_AMBULATORY_CARE_PROVIDER_SITE_OTHER): Payer: BLUE CROSS/BLUE SHIELD

## 2015-09-11 VITALS — BP 116/78 | HR 69 | Temp 98.6°F | Resp 16 | Ht 70.75 in | Wt 240.2 lb

## 2015-09-11 DIAGNOSIS — Z0000 Encounter for general adult medical examination without abnormal findings: Secondary | ICD-10-CM | POA: Diagnosis not present

## 2015-09-11 DIAGNOSIS — Z0189 Encounter for other specified special examinations: Secondary | ICD-10-CM | POA: Diagnosis not present

## 2015-09-11 LAB — BASIC METABOLIC PANEL
BUN: 17 mg/dL (ref 6–23)
CO2: 22 mEq/L (ref 19–32)
Calcium: 9.3 mg/dL (ref 8.4–10.5)
Chloride: 108 mEq/L (ref 96–112)
Creatinine, Ser: 0.81 mg/dL (ref 0.40–1.50)
GFR: 105.57 mL/min (ref >60.00)
Glucose, Bld: 94 mg/dL (ref 70–99)
Potassium: 4.2 mEq/L (ref 3.5–5.1)
Sodium: 140 mEq/L (ref 135–145)

## 2015-09-11 LAB — HEPATIC FUNCTION PANEL
ALBUMIN: 4.1 g/dL (ref 3.5–5.2)
ALT: 47 U/L (ref 0–53)
AST: 39 U/L — ABNORMAL HIGH (ref 0–37)
Alkaline Phosphatase: 69 U/L (ref 39–117)
Bilirubin, Direct: 0.2 mg/dL (ref 0.0–0.3)
TOTAL PROTEIN: 7.5 g/dL (ref 6.0–8.3)
Total Bilirubin: 0.6 mg/dL (ref 0.2–1.2)

## 2015-09-11 LAB — CBC WITH DIFFERENTIAL/PLATELET
Basophils Absolute: 0 10*3/uL (ref 0.0–0.1)
Basophils Relative: 0.3 % (ref 0.0–3.0)
Eosinophils Absolute: 0.3 10*3/uL (ref 0.0–0.7)
Eosinophils Relative: 3.5 % (ref 0.0–5.0)
HCT: 48.2 % (ref 39.0–52.0)
Hemoglobin: 16.2 g/dL (ref 13.0–17.0)
Lymphocytes Relative: 26.5 % (ref 12.0–46.0)
Lymphs Abs: 2.3 10*3/uL (ref 0.7–4.0)
MCHC: 33.7 g/dL (ref 30.0–36.0)
MCV: 93.2 fl (ref 78.0–100.0)
Monocytes Absolute: 0.6 10*3/uL (ref 0.1–1.0)
Monocytes Relative: 7.5 % (ref 3.0–12.0)
Neutro Abs: 5.4 10*3/uL (ref 1.4–7.7)
Neutrophils Relative %: 62.2 % (ref 43.0–77.0)
Platelets: 306 10*3/uL (ref 150.0–400.0)
RBC: 5.17 Mil/uL (ref 4.22–5.81)
RDW: 13 % (ref 11.5–15.5)
WBC: 8.6 10*3/uL (ref 4.0–10.5)

## 2015-09-11 LAB — TSH: TSH: 0.83 u[IU]/mL (ref 0.35–4.50)

## 2015-09-11 LAB — LIPID PANEL
CHOLESTEROL: 140 mg/dL (ref 0–200)
HDL: 42.5 mg/dL (ref >39.00)
LDL CALC: 77 mg/dL (ref 0–99)
NonHDL: 97.86
TRIGLYCERIDES: 104 mg/dL (ref 0.0–149.0)
Total CHOL/HDL Ratio: 3
VLDL: 20.8 mg/dL (ref 0.0–40.0)

## 2015-09-11 LAB — PSA: PSA: 1.09 ng/mL (ref 0.10–4.00)

## 2015-09-11 MED ORDER — EZETIMIBE 10 MG PO TABS: ORAL_TABLET | ORAL | Status: DC

## 2015-09-11 MED ORDER — LISINOPRIL 10 MG PO TABS: 10.0000 mg | ORAL_TABLET | Freq: Every day | ORAL | Status: DC

## 2015-09-11 MED ORDER — FLUOXETINE HCL 10 MG PO CAPS: 10.0000 mg | ORAL_CAPSULE | Freq: Every day | ORAL | Status: DC

## 2015-09-11 NOTE — Patient Instructions (Signed)
  Your next office appointment will be determined based upon review of your pending labs  and  xrays  Those written interpretation of the lab results and instructions will be transmitted to you by My Chart   Critical results will be called.   Followup as needed for any active or acute issue. Please report any significant change in your symptoms. 

## 2015-09-11 NOTE — Progress Notes (Signed)
   Subjective:    Patient ID: Justin Townsend, male    DOB: [birth year], 54 y.o.   MRN: [id number]  HPI The patient is here for a physical to assess status of active health conditions.  PMH, FH, & Social History reviewed & updated.No change in FH as recorded.  He is not on a heart healthy diet. He does add sodium to his food. He is unable to exercise as he sustained a hairline fracture to left foot. He is seeing Dr. Renae FicklePaul.  He smokes an occasional cigar on the weekends and drinks some beer. He does not drink or smoke during the week.  He had a colonoscopy scheduled in September 2015 but canceled it .He plans to reschedule this. He has rare dysphagia. He has lost 8-10 pounds; he feels it is because he has decreased sugar intake.  He does have nocturia once nightly.  His wife questions increasing his Prozac dose. He sees no need. He denies anxiety, irritability, panic attacks, or depression.  Review of Systems  Chest pain, palpitations, tachycardia, exertional dyspnea, paroxysmal nocturnal dyspnea, claudication or edema are absent. No unexplained weight loss, abdominal pain, significant dyspepsia, dysphagia, melena, rectal bleeding, or persistently small caliber stools. Dysuria, pyuria, hematuria, frequency, or polyuria are denied. Change in hair, skin, nails denied. No bowel changes of constipation or diarrhea. No intolerance to heat or cold.     Objective:   Physical Exam Pertinent or positive findings include: The left lower leg is in an orthopedic boot. He has a beard and mustache. He has mild crepitus of the knees. Varices are noted in the left scrotum. Prostate is normal without enlargement, induration, nodularity.  General appearance :adequately nourished; in no distress.  Eyes: No conjunctival inflammation or scleral icterus is present.  Oral exam:  Lips and gums are healthy appearing.There is no oropharyngeal erythema or exudate noted. Dental hygiene is good.  Heart:  Normal  rate and regular rhythm. S1 and S2 normal without gallop, murmur, click, rub or other extra sounds    Lungs:Chest clear to auscultation; no wheezes, rhonchi,rales ,or rubs present.No increased work of breathing.   Abdomen: bowel sounds normal, soft and non-tender without masses, organomegaly or hernias noted.  No guarding or rebound. No flank tenderness to percussion.  Vascular : all pulses equal ; no bruits present.  Skin:Warm & dry.  Intact without suspicious lesions or rashes ; no tenting or jaundice   Lymphatic: No lymphadenopathy is noted about the head, neck, axilla, or inguinal areas.   Neuro: Strength, tone normal.      Assessment & Plan:  #1 comprehensive physical exam; no acute findings  Plan: see Orders  & Recommendations

## 2015-09-11 NOTE — Progress Notes (Signed)
Pre visit review using our clinic review tool, if applicable. No additional management support is needed unless otherwise documented below in the visit note. 

## 2015-09-23 MED ORDER — LISINOPRIL 10 MG PO TABS: 10.0000 mg | ORAL_TABLET | Freq: Every day | ORAL | Status: DC

## 2015-09-23 NOTE — Telephone Encounter (Signed)
Left msg on triage stating sent request to have pt Lisinopril refill & we never received a response. Per chart request was sent back on 09/11/15 will resend electronically...Raechel Chute/lmb

## 2015-10-04 ENCOUNTER — Emergency Department (HOSPITAL_BASED_OUTPATIENT_CLINIC_OR_DEPARTMENT_OTHER)
Admission: EM | Admit: 2015-10-04 | Discharge: 2015-10-04 | Disposition: A | Discharge status: Home / Self Care | Payer: BLUE CROSS/BLUE SHIELD | Attending: Emergency Medicine | Admitting: Emergency Medicine

## 2015-10-04 ENCOUNTER — Encounter (HOSPITAL_BASED_OUTPATIENT_CLINIC_OR_DEPARTMENT_OTHER): Payer: Self-pay | Admitting: *Deleted

## 2015-10-04 DIAGNOSIS — Z79899 Other long term (current) drug therapy: Secondary | ICD-10-CM | POA: Insufficient documentation

## 2015-10-04 DIAGNOSIS — J45909 Unspecified asthma, uncomplicated: Secondary | ICD-10-CM | POA: Diagnosis not present

## 2015-10-04 DIAGNOSIS — Z720 Tobacco use: Secondary | ICD-10-CM | POA: Insufficient documentation

## 2015-10-04 DIAGNOSIS — R0989 Other specified symptoms and signs involving the circulatory and respiratory systems: Secondary | ICD-10-CM | POA: Diagnosis not present

## 2015-10-04 DIAGNOSIS — I10 Essential (primary) hypertension: Secondary | ICD-10-CM | POA: Diagnosis not present

## 2015-10-04 DIAGNOSIS — Z8719 Personal history of other diseases of the digestive system: Secondary | ICD-10-CM | POA: Insufficient documentation

## 2015-10-04 DIAGNOSIS — E785 Hyperlipidemia, unspecified: Secondary | ICD-10-CM | POA: Diagnosis not present

## 2015-10-04 MED ORDER — SUCRALFATE 1 G PO TABS
1.0000 g | ORAL_TABLET | Freq: Once | ORAL | Status: AC
  Administered 2015-10-04: 1 g via ORAL
  Filled 2015-10-04: qty 1

## 2015-10-04 MED ORDER — LIDOCAINE VISCOUS 2 % MT SOLN
15.0000 mL | Freq: Once | OROMUCOSAL | Status: AC
  Administered 2015-10-04: 15 mL via OROMUCOSAL
  Filled 2015-10-04: qty 15

## 2015-10-04 MED ORDER — SUCRALFATE 1 G PO TABS: ORAL_TABLET | ORAL | Status: DC

## 2015-10-04 NOTE — ED Notes (Addendum)
Pt. States that he was eating a triscuit this morning and he feels like a piece is caught in his throat. States he is able to drink,  but still has the sensation of the food being stuck.  Pt states that he has cough, congestion, and sore throat recently. No distress. resp even and unlabored.

## 2015-10-04 NOTE — ED Notes (Signed)
Pt eating a cracker at present without difficulty.

## 2015-10-04 NOTE — ED Notes (Signed)
MD with pt  

## 2015-10-04 NOTE — ED Notes (Signed)
Pt eating soft ice cream and states that he still feels like something is in his throat. No vomiting noted. Able to swallow without difficulty.

## 2015-10-04 NOTE — ED Provider Notes (Addendum)
CSN: [id number]     Arrival date & time 10/04/15  0355 History   First MD Initiated Contact with Patient 10/04/15 [phone]     Chief Complaint  Patient presents with  . something in his throat      (Consider location/radiation/quality/duration/timing/severity/associated sxs/prior Treatment) HPI  Is a 54 year old male who was recently had a viral respiratory illness. Specifically he has had fever to 101, sore throat which resolved yesterday and cough. He was eating Triscuits about 1:30 this morning. He did not have a choking episode nor did he experience any immediate foreign body sensation. He went to bed and subsequently developed a foreign body sensation in his throat. He points to the region below his larynx. He is having no difficulty breathing or speaking. He is able to drink fluids and has consumed 2 cups of hot tea without relief of his sensation. Symptoms are moderate and worse with swallowing.  Past Medical History  Diagnosis Date  . Hyperlipidemia   . Hypertension   . Asthma     only as child; resolved by age 54  . Gallstones    Past Surgical History  Procedure Laterality Date  . Thumb surgery Left     pinning post dislocation  . Orif toe fracture Right     foot  . No colonoscopy      SOC reviewed   Family History  Problem Relation Age of Onset  . Ulcers Mother   . Cirrhosis Mother     ? Alcohol Related   . Cancer Paternal Grandfather     CNS Cancer  . Panic disorder Other     Maternal FH  . Anxiety disorder Other     Maternal FH  . Heart attack Maternal Grandfather     >55  . Diabetes Neg Hx   . Stroke Neg Hx   . Colon cancer Neg Hx   . Colon polyps Neg Hx    Social History  Substance Use Topics  . Smoking status: Light Tobacco Smoker    Types: Cigars  . Smokeless tobacco: Never Used     Comment: rare cigar  . Alcohol Use: 6.0 oz/week    10 Cans of beer per week     Comment: Socially on weekend    Review of Systems  All other systems reviewed and  are negative.   Allergies  Review of patient's allergies indicates no known allergies.  Home Medications   Prior to Admission medications   Medication Sig Start Date End Date Taking? Authorizing Provider  DiphenhydrAMINE HCl (BENADRYL PO) Take 2 tablets by mouth at bedtime.    Historical Provider, MD  ezetimibe (ZETIA) 10 MG tablet TAKE ONE TABLET BY MOUTH ONCE DAILY 09/11/15   Newt LukesValerie A Leschber, MD  FLUoxetine (PROZAC) 10 MG capsule Take 1 capsule (10 mg total) by mouth daily. 09/11/15   Pecola LawlessWilliam F Hopper, MD  lisinopril (PRINIVIL,ZESTRIL) 10 MG tablet Take 1 tablet (10 mg total) by mouth daily. 09/23/15   Pecola LawlessWilliam F Hopper, MD  Multiple Vitamins-Minerals (MULTIVITAMIN PO) Take 2 tablets by mouth daily.    Historical Provider, MD  NON FORMULARY This supplement is Bone Up. 2 capsules after each meal.    Historical Provider, MD  NON FORMULARY This supplement is Arganine. 2 tablets per day.    Historical Provider, MD  Omega-3 Fatty Acids (FISH OIL) 1000 MG CAPS Take 1 capsule by mouth daily.    Historical Provider, MD  ORNITHINE PO Take by mouth.    Historical Provider,  MD  sucralfate (CARAFATE) 1 G tablet Dissolve 1 tablet in 30 milliliters of water and drink slowly every 6 hours as needed for esophageal foreign body sensation. 10/04/15   Addelyn Alleman, MD  traMADol (ULTRAM) 50 MG tablet Take 50 mg by mouth as needed.    Historical Provider, MD   BP 145/92 mmHg  Pulse 85  Temp(Src) 98.6 F (37 C)  Resp 18  Ht  (1.803 m)  Wt 250 lb (113.399 kg)  BMI 34.88 kg/m2  SpO2 97%   Physical Exam  General: Well-developed, well-nourished male in no acute distress; appearance consistent with age of record HENT: normocephalic; atraumatic; no dysphonia; no stridor; ulcerations of soft palate seen; no foreign body seen on indirect laryngoscopy Eyes: pupils equal, round and reactive to light; extraocular muscles intact Neck: supple Heart: regular rate and rhythm Lungs: clear to auscultation  bilaterally Abdomen: soft; nondistended Extremities: No deformity; full range of motion Neurologic: Awake, alert and oriented; motor function intact in all extremities and symmetric; no facial droop Skin: Warm and dry Psychiatric: Normal mood and affect    ED Course  Procedures (including critical care time)   MDM  4:52 AM Equivocal relief with viscous lidocaine. Patient able to swallow liquids, ice cream and crackers in the ED. Patient's symptoms may be due to an esophageal or pharyngeal abrasion due to the Triscuits. He may also have throat irritation due to his recent viral illness perhaps exacerbated by the Triscuits; the palatine ulcerations are suggestive of hand foot mouth disease although no lesions were seen on his palms. We will treat with Carafate for symptomatic relief.     Paula Libra, MD 10/04/15 0500  Paula Libra, MD 10/04/15 [phone]

## 2015-11-18 ENCOUNTER — Telehealth: Payer: Self-pay

## 2015-11-18 NOTE — Telephone Encounter (Signed)
Left message advising patient to call back to schedule nurse visit for flu vaccine 

## 2015-11-19 ENCOUNTER — Ambulatory Visit: Payer: BLUE CROSS/BLUE SHIELD | Admitting: Internal Medicine

## 2016-08-16 ENCOUNTER — Telehealth: Payer: Self-pay | Admitting: *Deleted

## 2016-08-16 NOTE — Telephone Encounter (Signed)
Unable to reach patient at time of Pre-Visit Call.  Left message for patient to return call when available.    

## 2016-08-17 ENCOUNTER — Encounter: Payer: Self-pay | Admitting: Family Medicine

## 2016-08-17 ENCOUNTER — Ambulatory Visit (INDEPENDENT_AMBULATORY_CARE_PROVIDER_SITE_OTHER): Payer: PRIVATE HEALTH INSURANCE | Admitting: Family Medicine

## 2016-08-17 VITALS — BP 126/70 | HR 74 | Temp 97.9°F | Ht 70.75 in | Wt 247.6 lb

## 2016-08-17 DIAGNOSIS — E782 Mixed hyperlipidemia: Secondary | ICD-10-CM | POA: Diagnosis not present

## 2016-08-17 DIAGNOSIS — F411 Generalized anxiety disorder: Secondary | ICD-10-CM

## 2016-08-17 DIAGNOSIS — Z2821 Immunization not carried out because of patient refusal: Secondary | ICD-10-CM

## 2016-08-17 DIAGNOSIS — I10 Essential (primary) hypertension: Secondary | ICD-10-CM

## 2016-08-17 DIAGNOSIS — I951 Orthostatic hypotension: Secondary | ICD-10-CM

## 2016-08-17 LAB — LIPID PANEL
CHOLESTEROL: 129 mg/dL (ref 0–200)
HDL: 45.3 mg/dL (ref >39.00)
LDL CALC: 59 mg/dL (ref 0–99)
NonHDL: 83.97
TRIGLYCERIDES: 124 mg/dL (ref 0.0–149.0)
Total CHOL/HDL Ratio: 3
VLDL: 24.8 mg/dL (ref 0.0–40.0)

## 2016-08-17 LAB — COMPREHENSIVE METABOLIC PANEL
ALBUMIN: 3.8 g/dL (ref 3.5–5.2)
ALK PHOS: 69 U/L (ref 39–117)
ALT: 48 U/L (ref 0–53)
AST: 35 U/L (ref 0–37)
BUN: 15 mg/dL (ref 6–23)
CALCIUM: 8.7 mg/dL (ref 8.4–10.5)
CHLORIDE: 107 meq/L (ref 96–112)
CO2: 25 mEq/L (ref 19–32)
Creatinine, Ser: 0.78 mg/dL (ref 0.40–1.50)
GFR: 109.89 mL/min (ref >60.00)
Glucose, Bld: 92 mg/dL (ref 70–99)
POTASSIUM: 4 meq/L (ref 3.5–5.1)
Sodium: 139 mEq/L (ref 135–145)
Total Bilirubin: 0.5 mg/dL (ref 0.2–1.2)
Total Protein: 7 g/dL (ref 6.0–8.3)

## 2016-08-17 LAB — MICROALBUMIN / CREATININE URINE RATIO
CREATININE, U: 111.5 mg/dL
Microalb Creat Ratio: 0.6 mg/g (ref 0.0–30.0)

## 2016-08-17 MED ORDER — EZETIMIBE 10 MG PO TABS: ORAL_TABLET | ORAL | 3 refills | Status: DC

## 2016-08-17 MED ORDER — LISINOPRIL 10 MG PO TABS: 10.0000 mg | ORAL_TABLET | Freq: Every day | ORAL | 3 refills | Status: DC

## 2016-08-17 MED ORDER — FLUOXETINE HCL 10 MG PO CAPS: 10.0000 mg | ORAL_CAPSULE | Freq: Every day | ORAL | 3 refills | Status: DC

## 2016-08-17 NOTE — Progress Notes (Signed)
Chief Complaint  Patient presents with  . Transitions Of Care    elevated BP    Subjective Justin Townsend is a 55 y.o. male who presents for hypertension follow up. Transitioning from Dr. Alwyn RenHopper who is retiring. He does monitor home blood pressures. Blood pressures ranging up to 140's over 100's, but generally in pre hypertension range. He is compliant with medications- Lisinopril 10 mg daily. Patient has these side effects of medication: none He is not adhering to a low sodium and low fat diet. Current exercise: no dedicated exercise   Dizziness Also has incidents where he will lift up his head and feel light-headed. Does not feel like his is spinning. No hearing issues. Does think he may have ear wax built up. His oral intake of fluids has not been great lately.  Dyslipidemia Patient presents for dyslipidemia follow up. Compliance with treatment thus far has been good- Zetia 10 mg daily. He denies myalgias. He does not use medications that may worsen dyslipidemias (corticosteroids, progestins, anabolic steroids, diuretics, beta-blockers, amiodarone, cyclosporine, olanzapine). He is not adhering to a low sodium and low fat diet. The patient exercises never.  The patient is not known to have coexisting coronary artery disease.    Past Medical History:  Diagnosis Date  . Anxiety   . Asthma    only as child; resolved by age 89  . Fatty liver   . Gallstones   . Hyperlipidemia   . Hypertension    Family History  Problem Relation Age of Onset  . Ulcers Mother   . Cirrhosis Mother     ? Alcohol Related   . Cancer Paternal Grandfather     CNS Cancer  . Panic disorder Other     Maternal FH  . Anxiety disorder Other     Maternal FH  . Heart attack Maternal Grandfather     >55  . Diabetes Neg Hx   . Stroke Neg Hx   . Colon cancer Neg Hx   . Colon polyps Neg Hx     Medications Current Outpatient Prescriptions on File Prior to Visit  Medication Sig Dispense Refill  .  DiphenhydrAMINE HCl (BENADRYL PO) Take 2 tablets by mouth at bedtime.    Marland Kitchen. ezetimibe (ZETIA) 10 MG tablet TAKE ONE TABLET BY MOUTH ONCE DAILY 90 tablet 3  . FLUoxetine (PROZAC) 10 MG capsule Take 1 capsule (10 mg total) by mouth daily. 90 capsule 3  . lisinopril (PRINIVIL,ZESTRIL) 10 MG tablet Take 1 tablet (10 mg total) by mouth daily. 90 tablet 3  . Multiple Vitamins-Minerals (MULTIVITAMIN PO) Take 2 tablets by mouth daily.    . Omega-3 Fatty Acids (FISH OIL) 1000 MG CAPS Take 1 capsule by mouth daily.     Allergies No Known Allergies  Review of Systems Eye:  no recent significant change in vision Cardiovascular:  no exercise intolerance, no chest pain, no palpitations Respiratory:  no cough or shortness of breath  Exam BP 126/70 (BP Location: Left Arm, Patient Position: Sitting, Cuff Size: Large)   Pulse 74   Temp 97.9 F (36.6 C) (Oral)   Ht 5' 10.75" (1.797 m)   Wt 247 lb 9.6 oz (112.3 kg)   SpO2 98%   BMI 34.78 kg/m  General:  well developed, well nourished, in no apparent distress Ears: Canals patent, TMs neg b/l Nose: Patent wo discharge Skin:  warm, no pallor or diaphoresis Eyes:  pupils equal and round, sclera anicteric without injection Neck: neck supple without adenopathy, thyromegaly,  masses, or bruits  Lungs:  clear to auscultation, breath sounds equal bilaterally Cardio:  regular rate and rhythm without murmurs, heart sounds without clicks or rubs Extremities:  no clubbing, cyanosis, or edema, no deformities, no skin discoloration Psych: well oriented with normal range of affect and appropriate judgment/insight  Essential hypertension - Plan: Comprehensive metabolic panel, Microalbumin / creatinine urine ratio, lisinopril (PRINIVIL,ZESTRIL) 10 MG tablet  Orthostasis  HYPERLIPIDEMIA - Plan: Lipid panel, ezetimibe (ZETIA) 10 MG tablet  Generalized anxiety disorder - Plan: FLUoxetine (PROZAC) 10 MG capsule  Refused influenza vaccine  Orders as above. Offered  to increase Lisinopril vs add another agent vs doing nothing vs lifestyle modifications. He opted for lifestyle modifications. Info given regarding orthostasis, keep PO intake of liquids high.  Pt declined flu shot. F/u in 4-6 weeks. The patient voiced understanding and agreement to the plan.  Jilda Roche [place], DO 08/17/16  8:16 AM

## 2016-08-17 NOTE — Progress Notes (Signed)
Pre visit review using our clinic review tool, if applicable. No additional management support is needed unless otherwise documented below in the visit note. 

## 2016-08-17 NOTE — Patient Instructions (Addendum)

## 2016-11-02 ENCOUNTER — Encounter: Payer: Self-pay | Admitting: Family Medicine

## 2016-11-02 ENCOUNTER — Ambulatory Visit (INDEPENDENT_AMBULATORY_CARE_PROVIDER_SITE_OTHER): Payer: PRIVATE HEALTH INSURANCE | Admitting: Family Medicine

## 2016-11-02 VITALS — BP 120/80 | HR 68 | Temp 98.2°F | Ht 70.75 in | Wt 249.0 lb

## 2016-11-02 DIAGNOSIS — J069 Acute upper respiratory infection, unspecified: Secondary | ICD-10-CM

## 2016-11-02 NOTE — Progress Notes (Signed)
Chief Complaint  Patient presents with  . Nasal Congestion    drainage,blowing nose(clear and a little yellow),low grade fever-sxs started last week    Yolanda Bonineerry L Donelan here for URI complaints.  Duration: 1 week  Associated symptoms: subjective fever, rhinorrhea, cough, nasal congestion Denies: sinus pain, ear pain, ear drainage, sore throat, shortness of breath and myalgia Treatment to date: ASA, Flonase Sick contacts: No  ROS:  Const: +fevers HEENT: As noted in HPI Lungs: No SOB  Past Medical History:  Diagnosis Date  . Anxiety   . Asthma    only as child; resolved by age 89  . Fatty liver   . Gallstones   . Hyperlipidemia   . Hypertension    Family History  Problem Relation Age of Onset  . Ulcers Mother   . Cirrhosis Mother     ? Alcohol Related   . Cancer Paternal Grandfather     CNS Cancer  . Panic disorder Other     Maternal FH  . Anxiety disorder Other     Maternal FH  . Heart attack Maternal Grandfather     >55  . Diabetes Neg Hx   . Stroke Neg Hx   . Colon cancer Neg Hx   . Colon polyps Neg Hx     BP 120/80 (BP Location: Left Arm, Patient Position: Sitting, Cuff Size: Large)   Pulse 68   Temp 98.2 F (36.8 C) (Oral)   Ht 5' 10.75" (1.797 m)   Wt 249 lb (112.9 kg)   SpO2 98%   BMI 34.97 kg/m  General: Awake, alert, appears stated age HEENT: AT, [place], ears patent b/l and TM's neg, nares patent w/o discharge, No sinus tenderness, pharynx pink and without exudates, MMM Neck: No masses or asymmetry Heart: RRR, no murmurs, no bruits Lungs: CTAB, no accessory muscle use Psych: Age appropriate judgment and insight, normal mood and affect  Acute upper respiratory infection  Orders as above.  Supportive care and over-the-counter medicine recommended as detailed in the after visit summary. F/u in 1 week if symptoms worsen or fail to improve. Pt voiced understanding and agreement to the plan.  Jilda Rocheicholas [place], DO 11/02/16 11:53 AM

## 2016-11-02 NOTE — Patient Instructions (Signed)
Claritin (loratadine), Allegra (fexofenadine), Zyrtec (cetirizine); these are listed in order from weakest to strongest. Generic, and therefore cheaper, options are in the parentheses.   Flonase (fluticasone); nasal spray that is over the counter. 2 sprays each nostril, once daily. Aim towards the same side eye when you spray.  There are available OTC, and the generic versions, which may be cheaper, are in parentheses. Show this to a pharmacist if you have trouble finding any of these items.  Mucinex may help with drainage also.  Humidify the air.  Push fluids.  If you are not improving after 1 week, let us know. Sooner if you are getting worse.

## 2016-11-28 ENCOUNTER — Ambulatory Visit (INDEPENDENT_AMBULATORY_CARE_PROVIDER_SITE_OTHER): Payer: PRIVATE HEALTH INSURANCE | Admitting: Family Medicine

## 2016-11-28 ENCOUNTER — Encounter: Payer: Self-pay | Admitting: Family Medicine

## 2016-11-28 ENCOUNTER — Telehealth: Payer: Self-pay | Admitting: Family Medicine

## 2016-11-28 VITALS — BP 126/76 | HR 111 | Temp 103.0°F | Wt 242.8 lb

## 2016-11-28 DIAGNOSIS — J208 Acute bronchitis due to other specified organisms: Secondary | ICD-10-CM | POA: Diagnosis not present

## 2016-11-28 DIAGNOSIS — B9689 Other specified bacterial agents as the cause of diseases classified elsewhere: Secondary | ICD-10-CM | POA: Diagnosis not present

## 2016-11-28 DIAGNOSIS — L03115 Cellulitis of right lower limb: Secondary | ICD-10-CM

## 2016-11-28 MED ORDER — DOXYCYCLINE HYCLATE 100 MG PO TABS: 100.0000 mg | ORAL_TABLET | Freq: Two times a day (BID) | ORAL | 0 refills | Status: DC

## 2016-11-28 NOTE — Telephone Encounter (Signed)
Pt's spouse called in to schedule an appt. She says that pt's temp is 103. Offered available morning appt's, spouse says that she would like to bring pt in this afternoon to be seen because she also has to work and shes not sure it pt is able to drive himself. Scheduled pt. ALSO, transferred pt and spouse to [facility].

## 2016-11-28 NOTE — Progress Notes (Signed)
Pre visit review using our clinic review tool, if applicable. No additional management support is needed unless otherwise documented below in the visit note. 

## 2016-11-28 NOTE — Patient Instructions (Addendum)
If you have drainage from the back of your knee, worsening pain, or worsening.  If you are doing better, you can cancel your appointment.  Let me know if you need more Tessalon Perles.

## 2016-11-28 NOTE — Progress Notes (Signed)
Chief Complaint  Patient presents with  . Fever    103.    Justin Townsend here for URI complaints. He is here with his wife.  Duration: 4 weeks with lingering URI symptoms Associated symptoms: sinus congestion, rhinorrhea, ear fullness and cough Denies: ear pain, ear drainage and sore throat Treatment to date: Benadryl, Tylenol, Motrin Sick contacts: Yes- at home and at work  Also noted that he has an area of redness behind his knee. He was wearing a brace over the weekend and noticed some redness yesterday. He thought it was just irritation from his brace rubbing against his leg. His wife would like it examined. No significant or drainage.  ROS:  Const: +fevers HEENT: As noted in HPI Lungs: No SOB  Past Medical History:  Diagnosis Date  . Anxiety   . Asthma    only as child; resolved by age 89  . Fatty liver   . Gallstones   . Hyperlipidemia   . Hypertension    Family History  Problem Relation Age of Onset  . Ulcers Mother   . Cirrhosis Mother     ? Alcohol Related   . Cancer Paternal Grandfather     CNS Cancer  . Panic disorder Other     Maternal FH  . Anxiety disorder Other     Maternal FH  . Heart attack Maternal Grandfather     >55  . Diabetes Neg Hx   . Stroke Neg Hx   . Colon cancer Neg Hx   . Colon polyps Neg Hx     BP 126/76 (BP Location: Left Arm, Patient Position: Sitting, Cuff Size: Large)   Pulse (!) 111   Temp (!) 103 F (39.4 C) (Oral)   Wt 242 lb 12.8 oz (110.1 kg)   SpO2 97% Comment: RA  BMI 34.10 kg/m  General: Awake, alert, appears stated age HEENT: AT, [place], ears patent b/l and TM's neg, nares patent w/o discharge, pharynx pink and without exudates, MMM Neck: No masses or asymmetry Heart: RRR, no murmurs, no bruits Lungs: CTAB, no accessory muscle use Skin: There is a very warm and erythematous patch of skin in the popliteal region on the R, there is a linear area of excoriation. I do not appreciate any fluctuance, drainage, or significant  TTP.  Psych: Age appropriate judgment and insight, normal mood and affect  Acute bacterial bronchitis  Cellulitis of right lower extremity - Plan: doxycycline (VIBRA-TABS) 100 MG tablet  Orders as above. Appears better than his VS's would indicate. He is healthy overall and able to tolerate PO intake. Based on this, I believe he is appropriate to send home with PO abx.  Doxy should cover both etiologies. I am more concerned about the skin causing fever than his URI. I would like to see him in 2 days to recheck this. Continue to push fluids, practice good hand hygiene, cover mouth when coughing. Letter for work given. If worsening pain, status, or rash, go to ER. Pt voiced understanding and agreement to the plan.  Justin Rocheicholas Paul [place], DO 11/28/16 3:05 PM

## 2016-11-30 ENCOUNTER — Encounter: Payer: Self-pay | Admitting: Family Medicine

## 2016-11-30 ENCOUNTER — Ambulatory Visit (INDEPENDENT_AMBULATORY_CARE_PROVIDER_SITE_OTHER): Payer: PRIVATE HEALTH INSURANCE | Admitting: Family Medicine

## 2016-11-30 VITALS — BP 122/74 | HR 87 | Temp 100.1°F | Ht 71.0 in | Wt 244.2 lb

## 2016-11-30 DIAGNOSIS — L03115 Cellulitis of right lower limb: Secondary | ICD-10-CM | POA: Diagnosis not present

## 2016-11-30 DIAGNOSIS — R509 Fever, unspecified: Secondary | ICD-10-CM

## 2016-11-30 NOTE — Progress Notes (Signed)
Pre visit review using our clinic review tool, if applicable. No additional management support is needed unless otherwise documented below in the visit note. 

## 2016-11-30 NOTE — Patient Instructions (Addendum)
I do not recommend you work when you have a fever.  If you start feeling worse, go to ER.  Please answer a blocked number on the weekend for any update.

## 2016-11-30 NOTE — Progress Notes (Signed)
Chief Complaint  Patient presents with  . Follow-up    Subjective: Patient is a 56 y.o. male here for f/u cellulitis. He is here with his wife. Notes that the redness and warmth is no longer there, no spreading. He is still having fevers and feels poor when he does have a fever. He is improving, however very slowly. His fevers continue to run above 101 F, sometimes despite taking 1000 mg Tylenol and 800 mg of ibuprofen. He is having nightsweats. He is able to tolerate an oral intake. Denies other rashes, sore throat, ear pain, urinary complaints, or diarrhea.  ROS: Skin: No redness or warmth, no drainage Const: +fever  Family History  Problem Relation Age of Onset  . Ulcers Mother   . Cirrhosis Mother     ? Alcohol Related   . Cancer Paternal Grandfather     CNS Cancer  . Panic disorder Other     Maternal FH  . Anxiety disorder Other     Maternal FH  . Heart attack Maternal Grandfather     >55  . Diabetes Neg Hx   . Stroke Neg Hx   . Colon cancer Neg Hx   . Colon polyps Neg Hx    Past Medical History:  Diagnosis Date  . Anxiety   . Asthma    only as child; resolved by age 89  . Fatty liver   . Gallstones   . Hyperlipidemia   . Hypertension    No Known Allergies  Current Outpatient Prescriptions:  .  DiphenhydrAMINE HCl (BENADRYL PO), Take 2 tablets by mouth at bedtime., Disp: , Rfl:  .  doxycycline (VIBRA-TABS) 100 MG tablet, Take 1 tablet (100 mg total) by mouth 2 (two) times daily., Disp: 20 tablet, Rfl: 0 .  ezetimibe (ZETIA) 10 MG tablet, TAKE ONE TABLET BY MOUTH ONCE DAILY, Disp: 90 tablet, Rfl: 3 .  FLUoxetine (PROZAC) 10 MG capsule, Take 1 capsule (10 mg total) by mouth daily., Disp: 90 capsule, Rfl: 3 .  ibuprofen (ADVIL,MOTRIN) 800 MG tablet, Take 800 mg by mouth as needed., Disp: , Rfl:  .  lisinopril (PRINIVIL,ZESTRIL) 10 MG tablet, Take 1 tablet (10 mg total) by mouth daily., Disp: 90 tablet, Rfl: 3 .  Multiple Vitamins-Minerals (MULTIVITAMIN PO), Take 2  tablets by mouth daily., Disp: , Rfl:  .  Omega-3 Fatty Acids (FISH OIL) 1000 MG CAPS, Take 1 capsule by mouth daily., Disp: , Rfl:   Objective:  BP 122/74 (BP Location: Left Arm, Patient Position: Sitting, Cuff Size: Large)   Pulse 87   Temp 100.1 F (37.8 C) (Oral)   Ht 5\' 11"  (1.803 m)   Wt 244 lb 3.2 oz (110.8 kg)   SpO2 96%   BMI 34.06 kg/m  General: Awake, appears stated age HEENT: MMM, EOMi  Heart: RRR, no murmurs Lungs: CTAB, no rales, wheezes or rhonchi. No accessory muscle use Skin: Area behind R knee is no longer erythematous or warm, no TTP, no fluctuance, no drainage; there is hyperpigmentation over the area Psych: Age appropriate judgment and insight, normal affect and mood  Assessment and Plan: Cellulitis of right lower extremity - Plan: Culture, blood (single), Culture, blood (single)  Fever, unspecified fever cause - Plan: Culture, blood (single), Culture, blood (single)  Did suggest watchful waiting versus going to the ER for further evaluation for possible bacteremia. Patient and wife were hoping to do blood cultures on an outpatient basis given the high out-of-pocket cost for ER visit. [phone] to call pt  at if blood cultures grow anything. I instructed them both that I will be calling from a blocked number if there is any change in plan. If things are negative, do not expect a call. Continue Motrin and Tylenol for fever control. Continue to push fluids. Follow-up as needed. The patient and his wife voiced understanding and agreement to the plan.  Jilda Roche [place], DO 11/30/16  11:51 AM

## 2016-12-02 ENCOUNTER — Encounter: Payer: Self-pay | Admitting: Family Medicine

## 2016-12-02 ENCOUNTER — Other Ambulatory Visit: Payer: Self-pay | Admitting: Family Medicine

## 2016-12-02 DIAGNOSIS — R509 Fever, unspecified: Secondary | ICD-10-CM

## 2016-12-02 DIAGNOSIS — R05 Cough: Secondary | ICD-10-CM

## 2016-12-02 DIAGNOSIS — R059 Cough, unspecified: Secondary | ICD-10-CM

## 2016-12-03 ENCOUNTER — Ambulatory Visit (HOSPITAL_BASED_OUTPATIENT_CLINIC_OR_DEPARTMENT_OTHER)
Admission: RE | Admit: 2016-12-03 | Discharge: 2016-12-03 | Disposition: A | Discharge status: Home / Self Care | Payer: PRIVATE HEALTH INSURANCE | Source: Ambulatory Visit | Attending: Family Medicine | Admitting: Family Medicine

## 2016-12-03 DIAGNOSIS — R059 Cough, unspecified: Secondary | ICD-10-CM

## 2016-12-03 DIAGNOSIS — R509 Fever, unspecified: Secondary | ICD-10-CM | POA: Insufficient documentation

## 2016-12-03 DIAGNOSIS — R05 Cough: Secondary | ICD-10-CM | POA: Insufficient documentation

## 2016-12-06 ENCOUNTER — Telehealth: Payer: Self-pay

## 2016-12-06 LAB — CULTURE, BLOOD (SINGLE)
ORGANISM ID, BACTERIA: NO GROWTH
ORGANISM ID, BACTERIA: NO GROWTH

## 2016-12-09 NOTE — Telephone Encounter (Signed)
Opened in error

## 2016-12-22 ENCOUNTER — Other Ambulatory Visit: Payer: Self-pay | Admitting: Internal Medicine

## 2016-12-22 NOTE — Telephone Encounter (Signed)
Routing to patient's new pcp 

## 2017-03-23 ENCOUNTER — Encounter: Payer: Self-pay | Admitting: Family Medicine

## 2017-08-31 ENCOUNTER — Ambulatory Visit (INDEPENDENT_AMBULATORY_CARE_PROVIDER_SITE_OTHER): Payer: PRIVATE HEALTH INSURANCE | Admitting: Family Medicine

## 2017-08-31 ENCOUNTER — Encounter: Payer: Self-pay | Admitting: Family Medicine

## 2017-08-31 ENCOUNTER — Other Ambulatory Visit: Payer: Self-pay | Admitting: Family Medicine

## 2017-08-31 VITALS — BP 112/72 | HR 80 | Temp 99.3°F | Ht 71.0 in | Wt 249.1 lb

## 2017-08-31 DIAGNOSIS — E782 Mixed hyperlipidemia: Secondary | ICD-10-CM

## 2017-08-31 DIAGNOSIS — B9689 Other specified bacterial agents as the cause of diseases classified elsewhere: Secondary | ICD-10-CM

## 2017-08-31 DIAGNOSIS — J208 Acute bronchitis due to other specified organisms: Secondary | ICD-10-CM

## 2017-08-31 DIAGNOSIS — I10 Essential (primary) hypertension: Secondary | ICD-10-CM

## 2017-08-31 MED ORDER — AZITHROMYCIN 250 MG PO TABS: ORAL_TABLET | ORAL | 0 refills | Status: DC

## 2017-08-31 NOTE — Progress Notes (Signed)
Pre visit review using our clinic review tool, if applicable. No additional management support is needed unless otherwise documented below in the visit note. 

## 2017-08-31 NOTE — Patient Instructions (Signed)
Continue to push fluids, practice good hand hygiene, and cover your mouth if you cough.  If you start having continued fevers, shaking or shortness of breath, seek immediate care.  Come back for your lab appointment fasting 9-12 hours.

## 2017-08-31 NOTE — Progress Notes (Signed)
Chief Complaint  Patient presents with  . Follow-up    medication followup    Subjective Justin Townsend is a 56 y.o. male who presents for hypertension follow up. He does not monitor home blood pressures. He is compliant with medications- lisinopril 10 mg daily. Patient has these side effects of medication: none He is sometimes adhering to a healthy diet overall. Current exercise: physically active at work  Dyslipidemia Patient presents for dyslipidemia follow up. Compliance with treatment thus far has been good- Zetia 10 mg daily, does not remember why he is not on a statin, will ask his wife. Thinks it may be bc of liver? He denies myalgias. He is sometimes adhering to a healthy diet. The patient exercises never.  The patient is not known to have coexisting coronary artery disease.  URI Duration: 3 days  Associated symptoms: fever 102 F, productive cough, chills Denies: sinus congestion, sinus pain, rhinorrhea, itchy watery eyes, ear pain, ear drainage, sore throat, wheezing, shortness of breath and myalgia Treatment to date: Augmentin (E visit 2 days ago), ibuprofen Sick contacts: No   Past Medical History:  Diagnosis Date  . Anxiety   . Asthma    only as child; resolved by age 13  . Fatty liver   . Gallstones   . Hyperlipidemia   . Hypertension    Family History  Problem Relation Age of Onset  . Ulcers Mother   . Cirrhosis Mother        ? Alcohol Related   . Cancer Paternal Grandfather        CNS Cancer  . Panic disorder Other        Maternal FH  . Anxiety disorder Other        Maternal FH  . Heart attack Maternal Grandfather        >55  . Diabetes Neg Hx   . Stroke Neg Hx   . Colon cancer Neg Hx   . Colon polyps Neg Hx      Medications Current Outpatient Prescriptions on File Prior to Visit  Medication Sig Dispense Refill  . DiphenhydrAMINE HCl (BENADRYL PO) Take 2 tablets by mouth at bedtime.    Marland Kitchen ezetimibe (ZETIA) 10 MG tablet TAKE ONE TABLET BY  MOUTH ONCE DAILY 90 tablet 3  . FLUoxetine (PROZAC) 10 MG capsule Take 1 capsule (10 mg total) by mouth daily. 90 capsule 3  . lisinopril (PRINIVIL,ZESTRIL) 10 MG tablet TAKE ONE TABLET BY MOUTH ONCE DAILY 90 tablet 3  . Multiple Vitamins-Minerals (MULTIVITAMIN PO) Take 2 tablets by mouth daily.    . Omega-3 Fatty Acids (FISH OIL) 1000 MG CAPS Take 1 capsule by mouth daily.     Allergies No Known Allergies  Review of Systems Cardiovascular: no chest pain Respiratory:  no shortness of breath  Exam BP 112/72 (BP Location: Left Arm, Patient Position: Sitting, Cuff Size: Large)   Pulse 80   Temp 99.3 F (37.4 C) (Oral)   Ht  (1.803 m)   Wt 249 lb 2 oz (113 kg)   SpO2 98%   BMI 34.75 kg/m  General:  well developed, well nourished, in no apparent distress Skin:  warm, no pallor or diaphoresis Eyes:  pupils equal and round, sclera anicteric without injection Heart :RRR, no murmurs, no bruits, no LE edema Lungs:  clear to auscultation, no accessory muscle use Psych: well oriented with normal range of affect and appropriate judgment/insight  Essential hypertension  HYPERLIPIDEMIA  Acute bacterial bronchitis  Change Augmentin  to Zpak to cover any atypicals. Cont Lisinopril and Zetia for now, if LFT's look good, will change to statin pending any reason from [facility] as to why he shouldn't be on it.  Counseled on diet and exercise F/u in 6 mo. Come back for fasting labs.  The patient voiced understanding and agreement to the plan.  Jilda Roche [place], DO 08/31/17  2:16 PM

## 2017-08-31 NOTE — Progress Notes (Unsigned)
No chief complaint on file.   Subjective Justin Townsend is a 56 y.o. male who presents for hypertension follow up. He does not monitor home blood pressures. He is compliant with medications- lisinopril 10 mg daily. Patient has these side effects of medication: none He is sometimes adhering to a healthy diet overall. Current exercise: physically active at work  Dyslipidemia Patient presents for dyslipidemia follow up. Compliance with treatment thus far has been good- Zetia 10 mg daily He denies myalgias. He is sometimes adhering to a low sodium and low fat diet. The patient exercises never.  The patient is not known to have coexisting coronary artery disease.   URI Duration: 3 d Associated symptoms: Fevers 102 F, scratchy throat, and productive cough Denies: sinus congestion, rhinorrhea, itchy watery eyes, ear pain, ear drainage, sore throat, shortness of breath, myalgia and rigors Treatment to date: Augmentin (started Tues), ibuprofen Sick contacts: No     Past Medical History:  Diagnosis Date  . Anxiety   . Asthma    only as child; resolved by age 72  . Fatty liver   . Gallstones   . Hyperlipidemia   . Hypertension    Family History  Problem Relation Age of Onset  . Ulcers Mother   . Cirrhosis Mother        ? Alcohol Related   . Cancer Paternal Grandfather        CNS Cancer  . Panic disorder Other        Maternal FH  . Anxiety disorder Other        Maternal FH  . Heart attack Maternal Grandfather        >55  . Diabetes Neg Hx   . Stroke Neg Hx   . Colon cancer Neg Hx   . Colon polyps Neg Hx      Medications Current Outpatient Prescriptions on File Prior to Visit  Medication Sig Dispense Refill  . DiphenhydrAMINE HCl (BENADRYL PO) Take 2 tablets by mouth at bedtime.    Marland Kitchen ezetimibe (ZETIA) 10 MG tablet TAKE ONE TABLET BY MOUTH ONCE DAILY 90 tablet 3  . FLUoxetine (PROZAC) 10 MG capsule Take 1 capsule (10 mg total) by mouth daily. 90 capsule 3  . lisinopril  (PRINIVIL,ZESTRIL) 10 MG tablet TAKE ONE TABLET BY MOUTH ONCE DAILY 90 tablet 3  . Multiple Vitamins-Minerals (MULTIVITAMIN PO) Take 2 tablets by mouth daily.    . Omega-3 Fatty Acids (FISH OIL) 1000 MG CAPS Take 1 capsule by mouth daily.     Allergies No Known Allergies  Review of Systems Cardiovascular: no chest pain Respiratory:  no shortness of breath  Exam There were no vitals taken for this visit. General:  well developed, well nourished, in no apparent distress Skin:  warm, no pallor or diaphoresis Eyes:  pupils equal and round, sclera anicteric without injection Heart :RRR, no murmurs, no bruits, no LE edema Lungs:  clear to auscultation, no accessory muscle use Psych: well oriented with normal range of affect and appropriate judgment/insight  No diagnosis found.  Orders as above. Counseled on diet and exercise F/u in 6 mo. The patient voiced understanding and agreement to the plan.  Jilda Roche [place], DO 08/31/17  2:00 PM

## 2017-09-04 ENCOUNTER — Other Ambulatory Visit: Payer: Self-pay | Admitting: Family Medicine

## 2017-09-04 ENCOUNTER — Encounter: Payer: Self-pay | Admitting: Family Medicine

## 2017-09-04 ENCOUNTER — Other Ambulatory Visit (INDEPENDENT_AMBULATORY_CARE_PROVIDER_SITE_OTHER): Payer: PRIVATE HEALTH INSURANCE

## 2017-09-04 DIAGNOSIS — I10 Essential (primary) hypertension: Secondary | ICD-10-CM

## 2017-09-04 DIAGNOSIS — E782 Mixed hyperlipidemia: Secondary | ICD-10-CM

## 2017-09-04 LAB — COMPREHENSIVE METABOLIC PANEL
ALBUMIN: 3.9 g/dL (ref 3.5–5.2)
ALT: 36 U/L (ref 0–53)
AST: 27 U/L (ref 0–37)
Alkaline Phosphatase: 65 U/L (ref 39–117)
BILIRUBIN TOTAL: 0.3 mg/dL (ref 0.2–1.2)
BUN: 16 mg/dL (ref 6–23)
CALCIUM: 9.3 mg/dL (ref 8.4–10.5)
CO2: 24 meq/L (ref 19–32)
CREATININE: 0.77 mg/dL (ref 0.40–1.50)
Chloride: 107 mEq/L (ref 96–112)
GFR: 111.11 mL/min (ref >60.00)
Glucose, Bld: 101 mg/dL — ABNORMAL HIGH (ref 70–99)
Potassium: 4.1 mEq/L (ref 3.5–5.1)
Sodium: 139 mEq/L (ref 135–145)
Total Protein: 7.2 g/dL (ref 6.0–8.3)

## 2017-09-04 LAB — LIPID PANEL
CHOL/HDL RATIO: 4
CHOLESTEROL: 132 mg/dL (ref 0–200)
HDL: 33.8 mg/dL — AB (ref >39.00)
LDL Cholesterol: 73 mg/dL (ref 0–99)
NonHDL: 98.05
TRIGLYCERIDES: 125 mg/dL (ref 0.0–149.0)
VLDL: 25 mg/dL (ref 0.0–40.0)

## 2017-09-04 MED ORDER — ATORVASTATIN CALCIUM 40 MG PO TABS: 40.0000 mg | ORAL_TABLET | Freq: Every day | ORAL | 3 refills | Status: DC

## 2017-09-04 NOTE — Progress Notes (Signed)
Lipitor called in per MyChart note. He was notified via MyChart also.

## 2017-09-06 ENCOUNTER — Other Ambulatory Visit: Payer: Self-pay | Admitting: Family Medicine

## 2017-09-06 DIAGNOSIS — I10 Essential (primary) hypertension: Secondary | ICD-10-CM

## 2017-10-24 ENCOUNTER — Other Ambulatory Visit: Payer: Self-pay | Admitting: Family Medicine

## 2017-10-24 DIAGNOSIS — E782 Mixed hyperlipidemia: Secondary | ICD-10-CM

## 2017-11-03 ENCOUNTER — Telehealth: Payer: Self-pay | Admitting: Family Medicine

## 2017-11-03 DIAGNOSIS — F411 Generalized anxiety disorder: Secondary | ICD-10-CM

## 2017-11-16 NOTE — Telephone Encounter (Signed)
Pt called in to follow up on refill request. Pt says that he has been out for a while. Pt says for insurance pharmacy would like to switch from capsules to tablets.    Please assist further.    [facility] [address]

## 2017-11-16 NOTE — Telephone Encounter (Signed)
Just signed form approving tabs rather than caps. TY.

## 2017-11-16 NOTE — Telephone Encounter (Signed)
This is in regards to fluoxetine

## 2017-11-17 MED ORDER — FLUOXETINE HCL 10 MG PO TABS: 10.0000 mg | ORAL_TABLET | Freq: Every day | ORAL | 3 refills | Status: DC

## 2017-11-17 NOTE — Telephone Encounter (Signed)
Received fax once again asking for Fluoxetine to be changed from capsules to tablets for Insurance purposes; [facility] Rx to pharmacy for tablets/SLS 12/28

## 2017-11-17 NOTE — Addendum Note (Signed)
Addended by: Regis BillSCATES, SHARON L on: 11/17/2017 01:19 PM   Modules accepted: Orders

## 2017-11-21 HISTORY — PX: ESOPHAGOGASTRODUODENOSCOPY (EGD) WITH ESOPHAGEAL DILATION: SHX5812

## 2017-11-21 HISTORY — PX: COLONOSCOPY: SHX174

## 2018-02-22 ENCOUNTER — Ambulatory Visit: Payer: PRIVATE HEALTH INSURANCE | Admitting: Internal Medicine

## 2018-03-07 ENCOUNTER — Encounter: Payer: Self-pay | Admitting: Family Medicine

## 2018-03-07 ENCOUNTER — Ambulatory Visit (INDEPENDENT_AMBULATORY_CARE_PROVIDER_SITE_OTHER): Payer: PRIVATE HEALTH INSURANCE | Admitting: Family Medicine

## 2018-03-07 VITALS — BP 126/76 | HR 69 | Temp 98.5°F | Ht 71.0 in | Wt 247.5 lb

## 2018-03-07 DIAGNOSIS — Z114 Encounter for screening for human immunodeficiency virus [HIV]: Secondary | ICD-10-CM | POA: Diagnosis not present

## 2018-03-07 DIAGNOSIS — Z125 Encounter for screening for malignant neoplasm of prostate: Secondary | ICD-10-CM

## 2018-03-07 DIAGNOSIS — Z1159 Encounter for screening for other viral diseases: Secondary | ICD-10-CM

## 2018-03-07 DIAGNOSIS — Z0000 Encounter for general adult medical examination without abnormal findings: Secondary | ICD-10-CM | POA: Diagnosis not present

## 2018-03-07 LAB — COMPREHENSIVE METABOLIC PANEL
ALT: 45 U/L (ref 0–53)
AST: 33 U/L (ref 0–37)
Albumin: 4 g/dL (ref 3.5–5.2)
Alkaline Phosphatase: 69 U/L (ref 39–117)
BUN: 17 mg/dL (ref 6–23)
CALCIUM: 8.9 mg/dL (ref 8.4–10.5)
CO2: 24 meq/L (ref 19–32)
CREATININE: 0.8 mg/dL (ref 0.40–1.50)
Chloride: 107 mEq/L (ref 96–112)
GFR: 106.12 mL/min (ref >60.00)
Glucose, Bld: 98 mg/dL (ref 70–99)
POTASSIUM: 4.3 meq/L (ref 3.5–5.1)
Sodium: 139 mEq/L (ref 135–145)
Total Bilirubin: 0.5 mg/dL (ref 0.2–1.2)
Total Protein: 6.7 g/dL (ref 6.0–8.3)

## 2018-03-07 LAB — LIPID PANEL
CHOL/HDL RATIO: 3
Cholesterol: 140 mg/dL (ref 0–200)
HDL: 52 mg/dL (ref >39.00)
LDL CALC: 72 mg/dL (ref 0–99)
NonHDL: 87.5
TRIGLYCERIDES: 77 mg/dL (ref 0.0–149.0)
VLDL: 15.4 mg/dL (ref 0.0–40.0)

## 2018-03-07 LAB — PSA: PSA: 1.02 ng/mL (ref 0.10–4.00)

## 2018-03-07 MED ORDER — IBUPROFEN 600 MG PO TABS: 600.0000 mg | ORAL_TABLET | Freq: Three times a day (TID) | ORAL | 3 refills | Status: DC | PRN

## 2018-03-07 NOTE — Progress Notes (Signed)
Pre visit review using our clinic review tool, if applicable. No additional management support is needed unless otherwise documented below in the visit note. 

## 2018-03-07 NOTE — Patient Instructions (Addendum)
Call your pharmacy to see about the availability of the new shingles vaccine (Shingrix).  Aim to do some physical exertion for 150 minutes per week. This is typically divided into 5 days per week, 30 minutes per day. The activity should be enough to get your heart rate up. Anything is better than nothing if you have time constraints.  Try to keep the diet clean.  Let us know if you need anything.

## 2018-03-07 NOTE — Progress Notes (Signed)
Chief Complaint  Patient presents with  . Annual Exam    Well Male Justin Townsend is here for a complete physical.   His last physical was >1 year ago.  Current diet: in general, a "healthy" diet, has been increasing portions lately.  Current exercise: physically active at work, needs to exercise more Weight trend: stable Does pt snore? Yes, no apneic episodes. Daytime fatigue? No. Seat belt? Yes.    Health maintenance Shingrix- No Colonoscopy- No- scheduled Tetanus- Yes HIV- No Hep C- No Prostate cancer screening- Yes   Past Medical History:  Diagnosis Date  . Anxiety   . Asthma    only as child; resolved by age 34  . Fatty liver   . Gallstones   . Hyperlipidemia   . Hypertension     Past Surgical History:  Procedure Laterality Date  . no colonoscopy     SOC reviewed  . ORIF TOE FRACTURE Right    foot  . Thumb Surgery Left    pinning post dislocation   Medications  Current Outpatient Medications on File Prior to Visit  Medication Sig Dispense Refill  . atorvastatin (LIPITOR) 40 MG tablet Take 1 tablet (40 mg total) by mouth daily. 90 tablet 3  . ezetimibe (ZETIA) 10 MG tablet TAKE ONE TABLET BY MOUTH ONCE DAILY 90 tablet 3  . FLUoxetine (PROZAC) 10 MG tablet Take 1 tablet (10 mg total) by mouth daily. 90 tablet 3  . lisinopril (PRINIVIL,ZESTRIL) 10 MG tablet TAKE 1 TABLET (10 MG TOTAL) BY MOUTH DAILY. 90 tablet 3  . Multiple Vitamins-Minerals (MULTIVITAMIN PO) Take 2 tablets by mouth daily.    . Omega-3 Fatty Acids (FISH OIL) 1000 MG CAPS Take 1 capsule by mouth daily.     Allergies No Known Allergies   Family History Family History  Problem Relation Age of Onset  . Ulcers Mother   . Cirrhosis Mother        ? Alcohol Related   . Cancer Paternal Grandfather        CNS Cancer  . Panic disorder Other        Maternal FH  . Anxiety disorder Other        Maternal FH  . Heart attack Maternal Grandfather        >55  . Diabetes Neg Hx   . Stroke Neg Hx   .  Colon cancer Neg Hx   . Colon polyps Neg Hx     Review of Systems: Constitutional:  no fevers or chills Eye:  no recent significant change in vision Ear/Nose/Mouth/Throat:  Ears:  no hearing loss Nose/Mouth/Throat:  no complaints of nasal congestion or bleeding, no sore throat Cardiovascular:  no chest pain Respiratory:  no shortness of breath Gastrointestinal:  no abdominal pain, no change in bowel habits, no nausea, vomiting, diarrhea, or constipation and no black or bloody stool GU:  Male: negative for dysuria, frequency, and incontinence and negative for prostate symptoms Musculoskeletal/Extremities: +intermittent R hip pain; otherwise no pain, redness, or swelling of the joints Integumentary (Skin/Breast):  no abnormal skin lesions reported Neurologic:  no headaches Endocrine: No unexpected weight changes Hematologic/Lymphatic:  no abnormal bleeding  Exam BP 126/76 (BP Location: Left Arm, Patient Position: Sitting, Cuff Size: Normal)   Pulse 69   Temp 98.5 F (36.9 C) (Oral)   Ht 5\' 11"  (1.803 m)   Wt 247 lb 8 oz (112.3 kg)   SpO2 96%   BMI 34.52 kg/m  General:  well developed, well  nourished, in no apparent distress Skin:  no significant moles, warts, or growths Head:  no masses, lesions, or tenderness Eyes:  pupils equal and round, sclera anicteric without injection Ears:  canals without lesions, TMs shiny without retraction, no obvious effusion, no erythema Nose:  nares patent, septum midline, mucosa normal Throat/Pharynx:  lips and gingiva without lesion; tongue and uvula midline; non-inflamed pharynx; no exudates or postnasal drainage Neck: neck supple without adenopathy, thyromegaly, or masses Lungs:  clear to auscultation, breath sounds equal bilaterally, no respiratory distress Cardio:  regular rate and rhythm without murmurs, heart sounds without clicks or rubs Abdomen:  abdomen soft, nontender; bowel sounds normal; no masses or organomegaly Genital (male):  Uncircumcised penis, no lesions or discharge; testes present bilaterally without masses or tenderness Rectal: Deferred Musculoskeletal:  symmetrical muscle groups noted without atrophy or deformity Extremities:  no clubbing, cyanosis, or edema, no deformities, no skin discoloration Neuro:  gait normal; deep tendon reflexes normal and symmetric Psych: well oriented with normal range of affect and appropriate judgment/insight  Assessment and Plan  Well adult exam - Plan: Comprehensive metabolic panel, Lipid panel  Screening for HIV (human immunodeficiency virus) - Plan: HIV antibody  Encounter for hepatitis C screening test for low risk patient - Plan: Hepatitis C antibody  Screening for prostate cancer - Plan: PSA   Well 57 y.o. male. Counseled on diet and exercise. Counseled on risks and benefits of prostate cancer screening with PSA. The patient agrees to undergo testing. Immunizations, labs, and further orders as above. Follow up 6 mo for med check. The patient voiced understanding and agreement to the plan.  Jilda Rocheicholas Paul VintonWendling, DO 03/07/18 7:54 AM

## 2018-03-08 LAB — HIV ANTIBODY (ROUTINE TESTING W REFLEX): HIV 1&2 Ab, 4th Generation: NONREACTIVE

## 2018-03-08 LAB — HEPATITIS C ANTIBODY
HEP C AB: NONREACTIVE
SIGNAL TO CUT-OFF: 0.03 (ref <1.00)

## 2018-04-03 ENCOUNTER — Encounter: Payer: Self-pay | Admitting: Internal Medicine

## 2018-04-03 ENCOUNTER — Ambulatory Visit (INDEPENDENT_AMBULATORY_CARE_PROVIDER_SITE_OTHER): Payer: PRIVATE HEALTH INSURANCE | Admitting: Internal Medicine

## 2018-04-03 VITALS — BP 126/74 | HR 84 | Ht 71.0 in | Wt 243.0 lb

## 2018-04-03 DIAGNOSIS — R1310 Dysphagia, unspecified: Secondary | ICD-10-CM

## 2018-04-03 DIAGNOSIS — Z1211 Encounter for screening for malignant neoplasm of colon: Secondary | ICD-10-CM

## 2018-04-03 MED ORDER — PEG-KCL-NACL-NASULF-NA ASC-C 140 G PO SOLR: 1.0000 | Freq: Once | ORAL | 0 refills | Status: AC

## 2018-04-03 NOTE — Patient Instructions (Signed)

## 2018-04-03 NOTE — Progress Notes (Signed)
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 Patient was seen 08/11/2014 for the exact same reasons. See that dictation for details. Colonoscopy and upper endoscopy were scheduled on 3 separate occasions and subsequently canceled by the patient. He denies heartburn or indigestion. He continues with intermittent solid food dysphagia. Occasional bleeding from hemorrhoids. Reported history of fatty liver based on previous imaging (ultrasound and CT 2005). Most recent blood work 03/07/2018 reveals normal comprehensive metabolic panel.  REVIEW OF SYSTEMS:  All non-GI ROS negative unless otherwise stated in the history of present illness except for sinus and allergy, anxiety  Past Medical History:  Diagnosis Date  . Anxiety   . Asthma    only as child; resolved by age 73  . Fatty liver   . Gallstones   . Hyperlipidemia   . Hypertension     Past Surgical History:  Procedure Laterality Date  . no colonoscopy     SOC reviewed  . ORIF TOE FRACTURE Right    foot  . Thumb Surgery Left    pinning post dislocation    Social History HAMEED KOLAR  reports that he has been smoking cigars.  He has never used smokeless tobacco. He reports that he drinks about 6.0 oz of alcohol per week. He reports that he does not use drugs.  family history includes Anxiety disorder in his other; Cancer in his paternal grandfather; Cirrhosis in his mother; Heart attack in his maternal grandfather; Panic disorder in his other; Ulcers in his mother.  No Known Allergies     PHYSICAL EXAMINATION: Vital signs: BP 126/74   Pulse 84   Ht  (1.803 m)   Wt 243 lb (110.2 kg)   BMI 33.89 kg/m   Constitutional: generally well-appearing, no acute distress Psychiatric: alert and oriented x3, cooperative Eyes: extraocular movements intact, anicteric,  conjunctiva pink Mouth: oral pharynx moist, no lesions Neck: supple no lymphadenopathy Cardiovascular: heart regular rate and rhythm, no murmur Lungs: clear to auscultation bilaterally Abdomen: soft, nontender, nondistended, no obvious ascites, no peritoneal signs, normal bowel sounds, no organomegaly Rectal:deferred until colonoscopy Extremities: no clubbing, cyanosis, or lower extremity edema bilaterally Skin: no lesions on visible extremities Neuro: No focal deficits. Cranial nerves intact  ASSESSMENT:  #1. Intermittent solid food dysphagia. Rule out peptic stricture. Rule out esophageal ring #2. Colon cancer screening. Baseline risk. Appropriate candidate without contraindication   PLAN:  #1. Upper endoscopy with probable esophageal dilation.The nature of the procedure, as well as the risks, benefits, and alternatives were carefully and thoroughly reviewed with the patient. Ample time for discussion and questions allowed. The patient understood, was satisfied, and agreed to proceed. #2. May need PPI if acid peptic stricture identified #3. Screening colonoscopy.The nature of the procedure, as well as the risks, benefits, and alternatives were carefully and thoroughly reviewed with the patient. Ample time for discussion and questions allowed. The patient understood, was satisfied, and agreed to proceed.

## 2018-06-21 ENCOUNTER — Encounter: Payer: Self-pay | Admitting: Internal Medicine

## 2018-06-21 ENCOUNTER — Ambulatory Visit (AMBULATORY_SURGERY_CENTER): Payer: PRIVATE HEALTH INSURANCE | Admitting: Internal Medicine

## 2018-06-21 VITALS — BP 116/72 | HR 66 | Temp 99.3°F | Resp 10 | Ht 71.0 in | Wt 243.0 lb

## 2018-06-21 DIAGNOSIS — K222 Esophageal obstruction: Secondary | ICD-10-CM | POA: Diagnosis not present

## 2018-06-21 DIAGNOSIS — K2970 Gastritis, unspecified, without bleeding: Secondary | ICD-10-CM

## 2018-06-21 DIAGNOSIS — Z1211 Encounter for screening for malignant neoplasm of colon: Secondary | ICD-10-CM | POA: Diagnosis not present

## 2018-06-21 DIAGNOSIS — K2100 Gastro-esophageal reflux disease with esophagitis, without bleeding: Secondary | ICD-10-CM

## 2018-06-21 DIAGNOSIS — R1310 Dysphagia, unspecified: Secondary | ICD-10-CM

## 2018-06-21 DIAGNOSIS — K2990 Gastroduodenitis, unspecified, without bleeding: Secondary | ICD-10-CM

## 2018-06-21 MED ORDER — SODIUM CHLORIDE 0.9 % IV SOLN: 500.0000 mL | Freq: Once | INTRAVENOUS | Status: DC

## 2018-06-21 MED ORDER — OMEPRAZOLE 40 MG PO CPDR: 40.0000 mg | DELAYED_RELEASE_CAPSULE | Freq: Every day | ORAL | 11 refills | Status: DC

## 2018-06-21 NOTE — Patient Instructions (Signed)
YOU HAD AN ENDOSCOPIC PROCEDURE TODAY AT THE [facility]:   Refer to the procedure report that was given to you for any specific questions about what was found during the examination.  If the procedure report does not answer your questions, please call your gastroenterologist to clarify.  If you requested that your care partner not be given the details of your procedure findings, then the procedure report has been included in a sealed envelope for you to review at your convenience later.  YOU SHOULD EXPECT: Some feelings of bloating in the abdomen. Passage of more gas than usual.  Walking can help get rid of the air that was put into your GI tract during the procedure and reduce the bloating. If you had a lower endoscopy (such as a colonoscopy or flexible sigmoidoscopy) you may notice spotting of blood in your stool or on the toilet paper. If you underwent a bowel prep for your procedure, you may not have a normal bowel movement for a few days.  Please Note:  You might notice some irritation and congestion in your nose or some drainage.  This is from the oxygen used during your procedure.  There is no need for concern and it should clear up in a day or so.  SYMPTOMS TO REPORT IMMEDIATELY:   Following lower endoscopy (colonoscopy or flexible sigmoidoscopy):  Excessive amounts of blood in the stool  Significant tenderness or worsening of abdominal pains  Swelling of the abdomen that is new, acute  Fever of 100F or higher   Following upper endoscopy (EGD)  Vomiting of blood or coffee ground material  New chest pain or pain under the shoulder blades  Painful or persistently difficult swallowing  New shortness of breath  Fever of 100F or higher  Black, tarry-looking stools  For urgent or emergent issues, a gastroenterologist can be reached at any hour by calling [phone].   DIET:  Follow post dilation diet  Drink plenty of fluids but you should avoid alcoholic beverages for  24 hours.  ACTIVITY:  You should plan to take it easy for the rest of today and you should NOT DRIVE or use heavy machinery until tomorrow (because of the sedation medicines used during the test).    FOLLOW UP: Our staff will call the number listed on your records the next business day following your procedure to check on you and address any questions or concerns that you may have regarding the information given to you following your procedure. If we do not reach you, we will leave a message.  However, if you are feeling well and you are not experiencing any problems, there is no need to return our call.  We will assume that you have returned to your regular daily activities without incident.  If any biopsies were taken you will be contacted by phone or by letter within the next 1-3 weeks.  Please call us at [phone] if you have not heard about the biopsies in 3 weeks.   Repeat Colonoscopy in 10 years Await for results Start Omeprazole Follow up with Dr. Marina GoodellPerry in 3 months Follow dilation diet    SIGNATURES/CONFIDENTIALITY: You and/or your care partner have signed paperwork which will be entered into your electronic medical record.  These signatures attest to the fact that that the information above on your After Visit Summary has been reviewed and is understood.  Full responsibility of the confidentiality of this discharge information lies with you and/or your care-partner.

## 2018-06-21 NOTE — Op Note (Signed)
[facility] Patient Name: Justin Townsend Procedure Date: 06/21/2018 10:38 AM MRN: [id number] Endoscopist: Wilhemina Bonito. Marina Goodell , MD Age: 57 Referring MD:  Date of Birth: 10-03-1961 Gender: Male Account #: [id number] Procedure:                Upper GI endoscopy, With biopsy; with esophageal                            dilation Elease Hashimoto 34F Indications:              Dysphagia Medicines:                Monitored Anesthesia Care Procedure:                Pre-Anesthesia Assessment:                           - Prior to the procedure, a History and Physical                            was performed, and patient medications and                            allergies were reviewed. The patient's tolerance of                            previous anesthesia was also reviewed. The risks                            and benefits of the procedure and the sedation                            options and risks were discussed with the patient.                            All questions were answered, and informed consent                            was obtained. Prior Anticoagulants: The patient has                            taken no previous anticoagulant or antiplatelet                            agents. ASA Grade Assessment: II - A patient with                            mild systemic disease. After reviewing the risks                            and benefits, the patient was deemed in                            satisfactory condition to undergo the procedure.  After obtaining informed consent, the endoscope was                            passed under direct vision. Throughout the                            procedure, the patient's blood pressure, pulse, and                            oxygen saturations were monitored continuously. The                            Model GIF-HQ190 [id number]) scope was introduced                            through the mouth, and advanced to the second  part                            of duodenum. The upper GI endoscopy was                            accomplished without difficulty. The patient                            tolerated the procedure well. Scope In: Scope Out: Findings:                 One benign-appearing, intrinsic moderate stenosis                            was found 39 cm from the incisors. This stenosis                            measured 1.5 cm (inner diameter). There was also                            esophagitis as manifested by friability and edema                            at the mucosal Z line. The scope was withdrawn.                            Dilation was performed with a Maloney dilator with                            no resistance at 54 Fr.                           The exam of the esophagus was otherwise normal.                           The stomach Revealed multiple superficial erosions  and a small hiatal hernia. Biopsies were taken with                            a cold forceps for Helicobacter pylori testing                            using CLOtest.                           The examined duodenum Revealed superficial erythema                            in the bulb. Otherwise normal.                           The cardia and gastric fundus were normal on                            retroflexion. Complications:            No immediate complications. Estimated Blood Loss:     Estimated blood loss: none. Impression:               1. GERD with esophagitis and esophageal stricture                            status post dilation                           2. Mild gastroduodenitis status post CLO test Recommendation:           - Patient has a contact number available for                            emergencies. The signs and symptoms of potential                            delayed complications were discussed with the                            patient. Return to normal activities  tomorrow.                            Written discharge instructions were provided to the                            patient.                           - Post dilation diet.                           - Prescribe omeprazole 40 mg daily; #30; 11 refills.                           - Await pathology results.                           -  Office follow-up with Dr. Marina Goodell in 3 months to                            assess response to therapies Wilhemina Bonito. Marina Goodell, MD 06/21/2018 11:10:22 AM This report has been signed electronically.

## 2018-06-21 NOTE — Progress Notes (Signed)
Called to room to assist during endoscopic procedure.  Patient ID and intended procedure confirmed with present staff. Received instructions for my participation in the procedure from the performing physician.  

## 2018-06-21 NOTE — Op Note (Signed)
[facility] Patient Name: Justin Townsend Procedure Date: 06/21/2018 10:38 AM MRN: [id number] Endoscopist: Wilhemina Bonito. Marina Goodell , MD Age: 57 Referring MD:  Date of Birth: 04-30-1961 Gender: Male Account #: [id number] Procedure:                Colonoscopy Indications:              Screening for colorectal malignant neoplasm Medicines:                Monitored Anesthesia Care Procedure:                Pre-Anesthesia Assessment:                           - Prior to the procedure, a History and Physical                            was performed, and patient medications and                            allergies were reviewed. The patient's tolerance of                            previous anesthesia was also reviewed. The risks                            and benefits of the procedure and the sedation                            options and risks were discussed with the patient.                            All questions were answered, and informed consent                            was obtained. Prior Anticoagulants: The patient has                            taken no previous anticoagulant or antiplatelet                            agents. ASA Grade Assessment: II - A patient with                            mild systemic disease. After reviewing the risks                            and benefits, the patient was deemed in                            satisfactory condition to undergo the procedure.                           After obtaining informed consent, the colonoscope  was passed under direct vision. Throughout the                            procedure, the patient's blood pressure, pulse, and                            oxygen saturations were monitored continuously. The                            Model CF-HQ190L [id number](SN#[id number]) scope was introduced                            through the anus and advanced to the the cecum,                            identified by  appendiceal orifice and ileocecal                            valve. The ileocecal valve, appendiceal orifice,                            and rectum were photographed. The quality of the                            bowel preparation was excellent. The colonoscopy                            was performed without difficulty. The patient                            tolerated the procedure well. The bowel preparation                            used was SUPREP. Scope In: 10:45:06 AM Scope Out: 10:57:51 AM Scope Withdrawal Time: 0 hours 10 minutes 22 seconds  Total Procedure Duration: 0 hours 12 minutes 45 seconds  Findings:                 Internal hemorrhoids were found during retroflexion.                           The exam was otherwise without abnormality on                            direct and retroflexion views. Complications:            No immediate complications. Estimated blood loss:                            None. Estimated Blood Loss:     Estimated blood loss: none. Impression:               - Internal hemorrhoids.                           - The examination was otherwise normal on direct  and retroflexion views.                           - No specimens collected. Recommendation:           - Repeat colonoscopy in 10 years for screening                            purposes.                           - Patient has a contact number available for                            emergencies. The signs and symptoms of potential                            delayed complications were discussed with the                            patient. Return to normal activities tomorrow.                            Written discharge instructions were provided to the                            patient.                           - Resume previous diet.                           - Continue present medications. Wilhemina Bonito. Marina Goodell, MD 06/21/2018 11:00:05 AM This report has been signed  electronically.

## 2018-06-21 NOTE — Progress Notes (Signed)
To Pacu, VSS. Report to [email or web address] 

## 2018-06-22 ENCOUNTER — Telehealth: Payer: Self-pay

## 2018-06-22 NOTE — Telephone Encounter (Signed)
  Follow up Call-  Call back number 06/21/2018  Post procedure Call Back phone  # [phone]  Permission to leave phone message Yes  Some recent data might be hidden     Patient questions:  Do you have a fever, pain , or abdominal swelling? No. Pain Score  0 *  Have you tolerated food without any problems? Yes.    Have you been able to return to your normal activities? Yes.    Do you have any questions about your discharge instructions: Diet   No. Medications  No. Follow up visit  No.  Do you have questions or concerns about your Care? No.  Actions: * If pain score is 4 or above: No action needed, pain <4.

## 2018-06-28 LAB — HELICOBACTER PYLORI SCREEN-BIOPSY: UREASE: NEGATIVE

## 2018-09-09 ENCOUNTER — Encounter (HOSPITAL_BASED_OUTPATIENT_CLINIC_OR_DEPARTMENT_OTHER): Payer: Self-pay | Admitting: *Deleted

## 2018-09-09 ENCOUNTER — Emergency Department (HOSPITAL_BASED_OUTPATIENT_CLINIC_OR_DEPARTMENT_OTHER)
Admission: EM | Admit: 2018-09-09 | Discharge: 2018-09-09 | Disposition: A | Discharge status: Home / Self Care | Payer: PRIVATE HEALTH INSURANCE | Attending: Emergency Medicine | Admitting: Emergency Medicine

## 2018-09-09 ENCOUNTER — Other Ambulatory Visit: Payer: Self-pay

## 2018-09-09 ENCOUNTER — Emergency Department (HOSPITAL_BASED_OUTPATIENT_CLINIC_OR_DEPARTMENT_OTHER): Payer: PRIVATE HEALTH INSURANCE

## 2018-09-09 DIAGNOSIS — J45909 Unspecified asthma, uncomplicated: Secondary | ICD-10-CM | POA: Diagnosis not present

## 2018-09-09 DIAGNOSIS — Z79899 Other long term (current) drug therapy: Secondary | ICD-10-CM | POA: Diagnosis not present

## 2018-09-09 DIAGNOSIS — W19XXXA Unspecified fall, initial encounter: Secondary | ICD-10-CM

## 2018-09-09 DIAGNOSIS — W0110XA Fall on same level from slipping, tripping and stumbling with subsequent striking against unspecified object, initial encounter: Secondary | ICD-10-CM | POA: Diagnosis not present

## 2018-09-09 DIAGNOSIS — F419 Anxiety disorder, unspecified: Secondary | ICD-10-CM | POA: Diagnosis not present

## 2018-09-09 DIAGNOSIS — M25562 Pain in left knee: Secondary | ICD-10-CM | POA: Diagnosis present

## 2018-09-09 DIAGNOSIS — M25462 Effusion, left knee: Secondary | ICD-10-CM

## 2018-09-09 DIAGNOSIS — I10 Essential (primary) hypertension: Secondary | ICD-10-CM | POA: Diagnosis not present

## 2018-09-09 DIAGNOSIS — F17290 Nicotine dependence, other tobacco product, uncomplicated: Secondary | ICD-10-CM | POA: Insufficient documentation

## 2018-09-09 NOTE — Discharge Instructions (Signed)
Rest - please stay off left knee as much as possible until swelling and pain improves Ice - ice for 20 minutes at a time, several times a day Knee immobilizer - wear until swelling improves Elevate - elevate leg above level of heart Ibuprofen - take with food. Take 600mg  up to 3-4 times daily Please follow up with orthopedics

## 2018-09-09 NOTE — ED Triage Notes (Signed)
Pt reports that he slipped and fell last night in the bathroom. Bruise to top of right thigh and knee.  Unable to ambulate since the fall.

## 2018-09-09 NOTE — ED Provider Notes (Signed)
[facility] EMERGENCY DEPARTMENT Provider Note   CSN: [id number] Arrival date & time: 09/09/18  1250     History   Chief Complaint Chief Complaint  Patient presents with  . Fall    HPI Justin Townsend is a 57 y.o. male who presents with a fall and left thigh and knee pain.  Past medical history significant for hypertension, hyperlipidemia.  He states that he was in a restaurant last night and went to the bathroom and thinks the floor may have been wet and he slipped and ran into a sink and fell backwards.  He started to have pain in the left knee.  He was unable to get up and needed help.  Today he has been limping but unable to totally bear weight on the left side.  The pain is mostly on the superior and lateral aspect of the knee which is swollen and the anterior mid thigh.  He denies any hip pain.  He had difficulty with range of motion of his hip and knee due to pain and swelling.   HPI  Past Medical History:  Diagnosis Date  . Anxiety   . Asthma    only as child; resolved by age 74  . Fatty liver   . Gallstones   . Hyperlipidemia   . Hypertension     Patient Active Problem List   Diagnosis Date Noted  . Generalized anxiety disorder 08/17/2016  . Non-compliant behavior 08/12/2015  . SNORING, HX OF 04/29/2010  . DYSPHAGIA UNSPECIFIED 01/13/2009  . HYPERLIPIDEMIA 12/25/2007  . Essential hypertension 12/25/2007  . NONSPEC ELEVATION OF LEVELS OF TRANSAMINASE/LDH 12/25/2007    Past Surgical History:  Procedure Laterality Date  . no colonoscopy     SOC reviewed  . ORIF TOE FRACTURE Right    foot  . Thumb Surgery Left    pinning post dislocation        Home Medications    Prior to Admission medications   Medication Sig Start Date End Date Taking? Authorizing Provider  atorvastatin (LIPITOR) 40 MG tablet Take by mouth. 09/04/17   [provider]  ezetimibe (ZETIA) 10 MG tablet TAKE ONE TABLET BY MOUTH ONCE DAILY 10/25/17   Sharlene Dory, DO  FLUoxetine (PROZAC) 10 MG tablet Take 1 tablet (10 mg total) by mouth daily. 11/17/17   Sharlene Dory, DO  ibuprofen (ADVIL,MOTRIN) 600 MG tablet Take 1 tablet (600 mg total) by mouth every 8 (eight) hours as needed. Patient taking differently: Take 800 mg by mouth every 8 (eight) hours as needed.  03/07/18   Sharlene Dory, DO  lisinopril (PRINIVIL,ZESTRIL) 10 MG tablet TAKE 1 TABLET (10 MG TOTAL) BY MOUTH DAILY. 09/06/17   Sharlene Dory, DO  Multiple Vitamin (MULTIVITAMIN) capsule Take by mouth.    [provider]  Multiple Vitamins-Minerals (MULTIVITAMIN PO) Take 2 tablets by mouth daily.    [provider]  Omega-3 Fatty Acids (FISH OIL) 1000 MG CAPS Take 1 capsule by mouth daily.    [provider]  omeprazole (PRILOSEC) 40 MG capsule Take 1 capsule (40 mg total) by mouth daily. 06/21/18   Hilarie Fredrickson, MD    Family History Family History  Problem Relation Age of Onset  . Ulcers Mother   . Cirrhosis Mother        ? Alcohol Related   . Cancer Paternal Grandfather        CNS Cancer  . Panic disorder Other  Maternal FH  . Anxiety disorder Other        Maternal FH  . Heart attack Maternal Grandfather        >55  . Diabetes Neg Hx   . Stroke Neg Hx   . Colon cancer Neg Hx   . Colon polyps Neg Hx     Social History Social History   Tobacco Use  . Smoking status: Light Tobacco Smoker    Types: Cigars  . Smokeless tobacco: Never Used  . Tobacco comment: rare cigar  Substance Use Topics  . Alcohol use: Yes    Alcohol/week: 10.0 standard drinks    Types: 10 Cans of beer per week    Comment: Socially on weekend  . Drug use: No     Allergies   Patient has no known allergies.   Review of Systems Review of Systems  Musculoskeletal: Positive for arthralgias, gait problem, joint swelling and myalgias. Negative for back pain.  Skin: Negative for wound.  Neurological: Negative for weakness and numbness.    All other systems reviewed and are negative.    Physical Exam Updated Vital Signs BP (!) 140/93 (BP Location: Left Arm)   Pulse (!) 112   Temp 99.4 F (37.4 C) (Oral)   Resp 18   Ht 5\' 11"  (1.803 m)   Wt 113.4 kg   SpO2 97%   BMI 34.87 kg/m   Physical Exam  Constitutional: He is oriented to person, place, and time. He appears well-developed and well-nourished. No distress.  Calm and cooperative  HENT:  Head: Normocephalic and atraumatic.  Eyes: Pupils are equal, round, and reactive to light. Conjunctivae are normal. Right eye exhibits no discharge. Left eye exhibits no discharge. No scleral icterus.  Neck: Normal range of motion.  Cardiovascular: Normal rate.  Pulmonary/Chest: Effort normal. No respiratory distress.  Abdominal: He exhibits no distension.  Musculoskeletal:  Left hip: No tenderness.  Left thigh: Tenderness over anterior thigh. No bruising  Left knee: Moderate joint effusion with mild abrasion of anterior knee. Tenderness over superior-lateral aspect of the knee. Compartments are soft  Neurological: He is alert and oriented to person, place, and time.  Skin: Skin is warm and dry.  Psychiatric: He has a normal mood and affect. His behavior is normal.  Nursing note and vitals reviewed.    ED Treatments / Results  Labs (all labs ordered are listed, but only abnormal results are displayed) Labs Reviewed - No data to display  EKG None  Radiology Dg Knee Complete 4 Views Left  Result Date: 09/09/2018 CLINICAL DATA:  Fall yesterday with left knee pain. EXAM: LEFT KNEE - COMPLETE 4+ VIEW COMPARISON:  None. FINDINGS: Minimal early degenerative changes. No acute fracture or dislocation. Small joint effusion. IMPRESSION: No acute fracture.  Small joint effusion. Electronically Signed   By: Elberta Fortis M.D.   On: 09/09/2018 15:14    Procedures Procedures (including critical care time)  Medications Ordered in ED Medications - No data to  display   Initial Impression / Assessment and Plan / ED Course  I have reviewed the triage vital signs and the nursing notes.  Pertinent labs & imaging results that were available during my care of the patient were reviewed by me and considered in my medical decision making (see chart for details).  57 year old male with left knee and thigh injury after a fall yesterday. On exam he has obvious knee effusion and is tender over the anterior thigh. Suspect knee sprain and possible  muscle strain of the thigh. Low suspicion for fracture however xray was obtained and was remarkable for joint effusion and mild arthritis. Pt was given knee immobilizer and crutches. RICE was discussed. Work note given. Advised f/u with orthopedics.  Final Clinical Impressions(s) / ED Diagnoses   Final diagnoses:  Fall, initial encounter  Effusion of left knee    ED Discharge Orders    None       Bethel Born, PA-C 09/09/18 1543    Linwood Dibbles, MD 09/10/18 2115

## 2018-09-13 ENCOUNTER — Encounter (INDEPENDENT_AMBULATORY_CARE_PROVIDER_SITE_OTHER): Payer: Self-pay | Admitting: Surgery

## 2018-09-13 ENCOUNTER — Ambulatory Visit (INDEPENDENT_AMBULATORY_CARE_PROVIDER_SITE_OTHER): Payer: PRIVATE HEALTH INSURANCE | Admitting: Surgery

## 2018-09-13 DIAGNOSIS — M25562 Pain in left knee: Secondary | ICD-10-CM

## 2018-09-13 DIAGNOSIS — S76109A Unspecified injury of unspecified quadriceps muscle, fascia and tendon, initial encounter: Secondary | ICD-10-CM

## 2018-09-13 NOTE — Patient Instructions (Signed)
-  Must wear knee immobilizer at all times when up and ambulating. -Recommend excessive bending of his knee and stay in the immobilizer as much as possible.  Can weight-bear as tolerated in immobilizer.

## 2018-09-13 NOTE — Progress Notes (Signed)
Office Visit Note   Patient: Justin Townsend           Date of Birth: 13-Aug-1961           MRN: [id number] Visit Date: 09/13/2018              Requested by: Sharlene Dory, DO [address] [place], [place] [address] PCP: Sharlene Dory, DO   Assessment & Plan: Visit Diagnoses:  1. Injury of quadriceps tendon   2. Acute pain of left knee     Plan: Advised patient his wife was present that I do believe that he may have ruptured his quadriceps tendon.  I reviewed x-rays with Dr. Ophelia Charter.  We will get MRI for better evaluation.  Recommend that he go back into the knee immobilizer that was given at the ER.  He understands the risk of being noncompliant with my instructions today.  Follow-Up Instructions: Return in about 1 week (around 09/20/2018) for With Dr. Ophelia Charter to review left knee MRI.   Orders:  Orders Placed This Encounter  Procedures  . MR Knee Left w/o contrast   No orders of the defined types were placed in this encounter.     Procedures: No procedures performed   Clinical Data: No additional findings.   Subjective: Chief Complaint  Patient presents with  . Left Knee - Pain    HPI  Review of Systems   Objective: Vital Signs: There were no vitals taken for this visit.  Physical Exam  Ortho Exam  Specialty Comments:  No specialty comments available.  Imaging: No results found.   PMFS History: Patient Active Problem List   Diagnosis Date Noted  . Generalized anxiety disorder 08/17/2016  . Non-compliant behavior 08/12/2015  . SNORING, HX OF 04/29/2010  . DYSPHAGIA UNSPECIFIED 01/13/2009  . HYPERLIPIDEMIA 12/25/2007  . Essential hypertension 12/25/2007  . NONSPEC ELEVATION OF LEVELS OF TRANSAMINASE/LDH 12/25/2007   Past Medical History:  Diagnosis Date  . Anxiety   . Asthma    only as child; resolved by age 74  . Fatty liver   . Gallstones   . Hyperlipidemia   . Hypertension     Family History  Problem  Relation Age of Onset  . Ulcers Mother   . Cirrhosis Mother        ? Alcohol Related   . Cancer Paternal Grandfather        CNS Cancer  . Panic disorder Other        Maternal FH  . Anxiety disorder Other        Maternal FH  . Heart attack Maternal Grandfather        >55  . Diabetes Neg Hx   . Stroke Neg Hx   . Colon cancer Neg Hx   . Colon polyps Neg Hx     Past Surgical History:  Procedure Laterality Date  . no colonoscopy     SOC reviewed  . ORIF TOE FRACTURE Right    foot  . Thumb Surgery Left    pinning post dislocation   Social History   Occupational History  . Occupation: Air traffic controller: [employer]  Tobacco Use  . Smoking status: Light Tobacco Smoker    Types: Cigars  . Smokeless tobacco: Never Used  . Tobacco comment: rare cigar  Substance and Sexual Activity  . Alcohol use: Yes    Alcohol/week: 10.0 standard drinks    Types: 10 Cans of beer  per week    Comment: Socially on weekend  . Drug use: No  . Sexual activity: Yes    Partners: Female

## 2018-09-15 ENCOUNTER — Ambulatory Visit (HOSPITAL_BASED_OUTPATIENT_CLINIC_OR_DEPARTMENT_OTHER)
Admission: RE | Admit: 2018-09-15 | Discharge: 2018-09-15 | Disposition: A | Discharge status: Home / Self Care | Payer: PRIVATE HEALTH INSURANCE | Source: Ambulatory Visit | Attending: Surgery | Admitting: Surgery

## 2018-09-15 DIAGNOSIS — M25462 Effusion, left knee: Secondary | ICD-10-CM | POA: Diagnosis not present

## 2018-09-15 DIAGNOSIS — M25562 Pain in left knee: Secondary | ICD-10-CM | POA: Diagnosis present

## 2018-09-15 DIAGNOSIS — S76112A Strain of left quadriceps muscle, fascia and tendon, initial encounter: Secondary | ICD-10-CM | POA: Insufficient documentation

## 2018-09-15 DIAGNOSIS — X58XXXA Exposure to other specified factors, initial encounter: Secondary | ICD-10-CM | POA: Diagnosis not present

## 2018-09-17 ENCOUNTER — Ambulatory Visit (INDEPENDENT_AMBULATORY_CARE_PROVIDER_SITE_OTHER): Payer: PRIVATE HEALTH INSURANCE | Admitting: Orthopaedic Surgery

## 2018-09-17 ENCOUNTER — Encounter (INDEPENDENT_AMBULATORY_CARE_PROVIDER_SITE_OTHER): Payer: Self-pay

## 2018-09-18 ENCOUNTER — Ambulatory Visit (INDEPENDENT_AMBULATORY_CARE_PROVIDER_SITE_OTHER): Payer: PRIVATE HEALTH INSURANCE | Admitting: Orthopaedic Surgery

## 2018-09-18 ENCOUNTER — Encounter (INDEPENDENT_AMBULATORY_CARE_PROVIDER_SITE_OTHER): Payer: Self-pay | Admitting: Orthopaedic Surgery

## 2018-09-18 ENCOUNTER — Encounter (HOSPITAL_COMMUNITY): Payer: Self-pay | Admitting: *Deleted

## 2018-09-18 ENCOUNTER — Other Ambulatory Visit: Payer: Self-pay

## 2018-09-18 VITALS — BP 132/89 | HR 80 | Ht 71.0 in | Wt 250.0 lb

## 2018-09-18 DIAGNOSIS — S76112A Strain of left quadriceps muscle, fascia and tendon, initial encounter: Secondary | ICD-10-CM | POA: Insufficient documentation

## 2018-09-18 DIAGNOSIS — S76112D Strain of left quadriceps muscle, fascia and tendon, subsequent encounter: Secondary | ICD-10-CM

## 2018-09-18 NOTE — Progress Notes (Signed)
Office Visit Note   Patient: Justin Townsend           Date of Birth: 05-14-1961           MRN: [id number] Visit Date: 09/18/2018              Requested by: Sharlene Dory, DO [address] [place], [place] [address] PCP: Sharlene Dory, DO   Assessment & Plan: Visit Diagnoses:  1. Rupture of left quadriceps tendon, subsequent encounter     Plan: Today the MRI scan was reviewed with the patient images and I gave him a copy of the report.  I previously discussed the report results with his wife yesterday.  He had a brother's had a history of DVT and had sudden death.  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  Currently is not able to use the clutch due to the quad tendon tear.  Plan procedure discussed postoperative mobilization, therapy, discussion about anesthesia and femoral nerve block.  Questions elicited and answered he understands request to proceed.  I discussed with him that he likely will be out of work for at least 8 weeks.  Follow-Up Instructions: No follow-ups on file.   Orders:  No orders of the defined types were placed in this encounter.  No orders of the defined types were placed in this encounter.     Procedures: No procedures performed   Clinical Data: No additional findings.   Subjective: Chief Complaint  Patient presents with  . Left Knee - Follow-up    MRI Left Knee Review    HPI 57 year old male returns post MRI scan of left knee after a fall with left knee pain.  He was in a restaurant slipped was in the bathroom and ran into a sink falling backwards.  Exam was consistent with partial quadriceps tendon rupture an MRI scan has been obtained which confirms the diagnosis.  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  He states there are some automatic trucks available.  Review of Systems positive for history of dysphasia,  hypertension, anxiety, hyperlipidemia.  History of missing appt's .  Positive for an asthma as a child.  Previous right toe fracture.  Thumb surgery after dislocation.  Otherwise negative as pertains HPI.   Objective: Vital Signs: BP 132/89   Pulse 80   Ht 5\' 11"  (1.803 m)   Wt 250 lb (113.4 kg)   BMI 34.87 kg/m   Physical Exam  Constitutional: He is oriented to person, place, and time. He appears well-developed and well-nourished.  HENT:  Head: Normocephalic and atraumatic.  Eyes: Pupils are equal, round, and reactive to light. EOM are normal.  Neck: No tracheal deviation present. No thyromegaly present.  Cardiovascular: Normal rate.  Pulmonary/Chest: Effort normal. He has no wheezes.  Abdominal: Soft. Bowel sounds are normal.  Neurological: He is alert and oriented to person, place, and time.  Skin: Skin is warm and dry. Capillary refill takes less than 2 seconds.  Psychiatric: He has a normal mood and affect. His behavior is normal. Judgment and thought content normal.    Ortho Exam patient has left knee effusion pain tenderness with palpation mid quad tendon with palpable defect.  He still is able to lift his leg.  More tenderness over the vastus lateralis to the VMO.  Distal pulses intact.  Collateral ligaments are stable.  Specialty Comments:  No specialty comments available.  Imaging: CLINICAL DATA:  Larey Seat 1 week ago and injured knee. Persistent pain and swelling.  EXAM: MRI OF THE LEFT KNEE WITHOUT CONTRAST  TECHNIQUE: Multiplanar, multisequence MR imaging of the knee was performed. No intravenous contrast was administered.  COMPARISON:  Radiographs  FINDINGS: MENISCI  Medial meniscus:  Intact  Lateral meniscus:  Intact  LIGAMENTS  Cruciates:  Intact  Collaterals:  Intact  CARTILAGE  Patellofemoral:  Mild degenerative chondrosis.  Medial:  Mild degenerative chondrosis.  Lateral:  Mild to moderate degenerative chondrosis.  Joint:   Moderate-sized joint effusion.  Popliteal Fossa:  No popliteal mass or Baker's cyst.  Extensor Mechanism: Partially torn distal quadriceps tendon. This is incompletely evaluated but I believe the rectus femoris component may be torn. There is also at least partial thickness tearing along the confluence of the vastus lateralis component. I would recommend a repeat MRI of the distal thigh to help better evaluate the exact extent of the tear (if clinically necessary). Not sure whether not this will change management.  The patellar tendon is intact. The patella retinacular structures are intact.  Bones:  No acute bony findings.  Other: Mild edema in the vastus lateralis muscle.  IMPRESSION: 1. Partial quadriceps tendon tear. If clinically indicated, recommend dedicated MR imaging of the distal thigh to help better evaluate the exact extent and components of the tear. 2. The patellar tendon is intact. 3. No findings for internal derangement of the knee. 4. Moderate-sized joint effusion   Electronically Signed   By: Rudie Meyer M.D.   On: 09/15/2018 15:12   PMFS History: Patient Active Problem List   Diagnosis Date Noted  . Generalized anxiety disorder 08/17/2016  . Non-compliant behavior 08/12/2015  . SNORING, HX OF 04/29/2010  . DYSPHAGIA UNSPECIFIED 01/13/2009  . HYPERLIPIDEMIA 12/25/2007  . Essential hypertension 12/25/2007  . NONSPEC ELEVATION OF LEVELS OF TRANSAMINASE/LDH 12/25/2007   Past Medical History:  Diagnosis Date  . Anxiety   . Asthma    only as child; resolved by age 75  . Fatty liver   . Gallstones   . Hyperlipidemia   . Hypertension     Family History  Problem Relation Age of Onset  . Ulcers Mother   . Cirrhosis Mother        ? Alcohol Related   . Cancer Paternal Grandfather        CNS Cancer  . Panic disorder Other        Maternal FH  . Anxiety disorder Other        Maternal FH  . Heart attack Maternal Grandfather        >55  .  Diabetes Neg Hx   . Stroke Neg Hx   . Colon cancer Neg Hx   . Colon polyps Neg Hx     Past Surgical History:  Procedure Laterality Date  . no colonoscopy     SOC reviewed  . ORIF TOE FRACTURE Right    foot  . Thumb Surgery Left    pinning post dislocation   Social History   Occupational History  . Occupation: Air traffic controller: [employer]  Tobacco Use  . Smoking status: Light Tobacco Smoker    Types: Cigars  . Smokeless tobacco: Never Used  . Tobacco comment: rare cigar  Substance and Sexual Activity  . Alcohol use: Yes    Alcohol/week: 10.0 standard drinks    Types: 10 Cans of beer per week  Comment: Socially on weekend  . Drug use: No  . Sexual activity: Yes    Partners: Female

## 2018-09-18 NOTE — Progress Notes (Signed)
Spoke with pt for pre-op call. Pt denies cardiac history, chest pain or sob. Pt states he is not diabetic. 

## 2018-09-19 ENCOUNTER — Ambulatory Visit (HOSPITAL_COMMUNITY): Payer: PRIVATE HEALTH INSURANCE | Admitting: Anesthesiology

## 2018-09-19 ENCOUNTER — Observation Stay (HOSPITAL_COMMUNITY)
Admission: RE | Admit: 2018-09-19 | Discharge: 2018-09-20 | Disposition: A | Discharge status: Home / Self Care | Payer: PRIVATE HEALTH INSURANCE | Source: Ambulatory Visit | Attending: Orthopaedic Surgery | Admitting: Orthopaedic Surgery

## 2018-09-19 ENCOUNTER — Encounter (HOSPITAL_COMMUNITY): Payer: Self-pay | Admitting: *Deleted

## 2018-09-19 ENCOUNTER — Other Ambulatory Visit: Payer: Self-pay

## 2018-09-19 ENCOUNTER — Encounter (HOSPITAL_COMMUNITY)
Admission: RE | Disposition: A | Discharge status: Home / Self Care | Payer: Self-pay | Source: Ambulatory Visit | Attending: Orthopaedic Surgery

## 2018-09-19 DIAGNOSIS — K219 Gastro-esophageal reflux disease without esophagitis: Secondary | ICD-10-CM | POA: Diagnosis not present

## 2018-09-19 DIAGNOSIS — E669 Obesity, unspecified: Secondary | ICD-10-CM | POA: Insufficient documentation

## 2018-09-19 DIAGNOSIS — I10 Essential (primary) hypertension: Secondary | ICD-10-CM | POA: Insufficient documentation

## 2018-09-19 DIAGNOSIS — F17290 Nicotine dependence, other tobacco product, uncomplicated: Secondary | ICD-10-CM | POA: Insufficient documentation

## 2018-09-19 DIAGNOSIS — Z6834 Body mass index (BMI) 34.0-34.9, adult: Secondary | ICD-10-CM | POA: Insufficient documentation

## 2018-09-19 DIAGNOSIS — S76119A Strain of unspecified quadriceps muscle, fascia and tendon, initial encounter: Secondary | ICD-10-CM | POA: Diagnosis present

## 2018-09-19 DIAGNOSIS — S76112A Strain of left quadriceps muscle, fascia and tendon, initial encounter: Principal | ICD-10-CM | POA: Insufficient documentation

## 2018-09-19 DIAGNOSIS — W010XXA Fall on same level from slipping, tripping and stumbling without subsequent striking against object, initial encounter: Secondary | ICD-10-CM | POA: Diagnosis not present

## 2018-09-19 HISTORY — DX: Gastro-esophageal reflux disease without esophagitis: K21.9

## 2018-09-19 HISTORY — DX: Personal history of other diseases of the digestive system: Z87.19

## 2018-09-19 HISTORY — PX: QUADRICEPS TENDON REPAIR: SHX756

## 2018-09-19 LAB — COMPREHENSIVE METABOLIC PANEL
ALBUMIN: 3.9 g/dL (ref 3.5–5.0)
ALK PHOS: 59 U/L (ref 38–126)
ALT: 49 U/L — ABNORMAL HIGH (ref 0–44)
ANION GAP: 10 (ref 5–15)
AST: 40 U/L (ref 15–41)
BUN: 13 mg/dL (ref 6–20)
CALCIUM: 9.1 mg/dL (ref 8.9–10.3)
CHLORIDE: 109 mmol/L (ref 98–111)
CO2: 21 mmol/L — AB (ref 22–32)
Creatinine, Ser: 0.9 mg/dL (ref 0.61–1.24)
GFR calc non Af Amer: >60 mL/min (ref >60)
GLUCOSE: 96 mg/dL (ref 70–99)
POTASSIUM: 4 mmol/L (ref 3.5–5.1)
SODIUM: 140 mmol/L (ref 135–145)
Total Bilirubin: 0.7 mg/dL (ref 0.3–1.2)
Total Protein: 7 g/dL (ref 6.5–8.1)

## 2018-09-19 LAB — CBC
HCT: 50.3 % (ref 39.0–52.0)
HEMOGLOBIN: 16.3 g/dL (ref 13.0–17.0)
MCH: 30.9 pg (ref 26.0–34.0)
MCHC: 32.4 g/dL (ref 30.0–36.0)
MCV: 95.4 fL (ref 80.0–100.0)
PLATELETS: 238 10*3/uL (ref 150–400)
RBC: 5.27 MIL/uL (ref 4.22–5.81)
RDW: 12.1 % (ref 11.5–15.5)
WBC: 7.6 10*3/uL (ref 4.0–10.5)
nRBC: 0 % (ref 0.0–0.2)

## 2018-09-19 LAB — SURGICAL PCR SCREEN
MRSA, PCR: NEGATIVE
STAPHYLOCOCCUS AUREUS: NEGATIVE

## 2018-09-19 SURGERY — REPAIR, TENDON, QUADRICEPS
Anesthesia: General | Site: Leg Upper | Laterality: Left

## 2018-09-19 MED ORDER — LACTATED RINGERS IV SOLN
INTRAVENOUS | Status: DC | PRN
  Administered 2018-09-19 (×2): via INTRAVENOUS

## 2018-09-19 MED ORDER — 0.9 % SODIUM CHLORIDE (POUR BTL) OPTIME
TOPICAL | Status: DC | PRN
  Administered 2018-09-19: 1000 mL

## 2018-09-19 MED ORDER — METHOCARBAMOL 500 MG PO TABS
500.0000 mg | ORAL_TABLET | Freq: Four times a day (QID) | ORAL | Status: DC | PRN
  Administered 2018-09-20: 500 mg via ORAL
  Filled 2018-09-19: qty 1

## 2018-09-19 MED ORDER — ROCURONIUM BROMIDE 10 MG/ML (PF) SYRINGE
PREFILLED_SYRINGE | INTRAVENOUS | Status: DC | PRN
  Administered 2018-09-19: 50 mg via INTRAVENOUS

## 2018-09-19 MED ORDER — FENTANYL CITRATE (PF) 100 MCG/2ML IJ SOLN
50.0000 ug | Freq: Once | INTRAMUSCULAR | Status: AC
  Administered 2018-09-19: 50 ug via INTRAVENOUS

## 2018-09-19 MED ORDER — DEXAMETHASONE SODIUM PHOSPHATE 10 MG/ML IJ SOLN
INTRAMUSCULAR | Status: DC | PRN
  Administered 2018-09-19: 10 mg via INTRAVENOUS

## 2018-09-19 MED ORDER — CEFAZOLIN SODIUM-DEXTROSE 2-4 GM/100ML-% IV SOLN
2.0000 g | INTRAVENOUS | Status: AC
  Administered 2018-09-19: 2 g via INTRAVENOUS
  Filled 2018-09-19: qty 100

## 2018-09-19 MED ORDER — DOCUSATE SODIUM 100 MG PO CAPS
100.0000 mg | ORAL_CAPSULE | Freq: Two times a day (BID) | ORAL | Status: DC
  Administered 2018-09-19 – 2018-09-20 (×2): 100 mg via ORAL
  Filled 2018-09-19 (×2): qty 1

## 2018-09-19 MED ORDER — LIDOCAINE 2% (20 MG/ML) 5 ML SYRINGE
INTRAMUSCULAR | Status: DC | PRN
  Administered 2018-09-19: 80 mg via INTRAVENOUS

## 2018-09-19 MED ORDER — SUGAMMADEX SODIUM 500 MG/5ML IV SOLN
INTRAVENOUS | Status: AC
  Filled 2018-09-19: qty 5

## 2018-09-19 MED ORDER — PANTOPRAZOLE SODIUM 40 MG PO TBEC
40.0000 mg | DELAYED_RELEASE_TABLET | Freq: Every day | ORAL | Status: DC
  Administered 2018-09-20: 40 mg via ORAL
  Filled 2018-09-19: qty 1

## 2018-09-19 MED ORDER — METOCLOPRAMIDE HCL 5 MG PO TABS: 5.0000 mg | ORAL_TABLET | Freq: Three times a day (TID) | ORAL | Status: DC | PRN

## 2018-09-19 MED ORDER — EZETIMIBE 10 MG PO TABS
10.0000 mg | ORAL_TABLET | Freq: Every day | ORAL | Status: DC
  Administered 2018-09-19 – 2018-09-20 (×2): 10 mg via ORAL
  Filled 2018-09-19 (×2): qty 1

## 2018-09-19 MED ORDER — ONDANSETRON HCL 4 MG/2ML IJ SOLN: 4.0000 mg | Freq: Four times a day (QID) | INTRAMUSCULAR | Status: DC | PRN

## 2018-09-19 MED ORDER — ONDANSETRON HCL 4 MG/2ML IJ SOLN
INTRAMUSCULAR | Status: DC | PRN
  Administered 2018-09-19: 4 mg via INTRAVENOUS

## 2018-09-19 MED ORDER — OXYCODONE HCL 5 MG PO TABS: 5.0000 mg | ORAL_TABLET | Freq: Once | ORAL | Status: DC | PRN

## 2018-09-19 MED ORDER — FENTANYL CITRATE (PF) 250 MCG/5ML IJ SOLN
INTRAMUSCULAR | Status: AC
  Filled 2018-09-19: qty 5

## 2018-09-19 MED ORDER — FENTANYL CITRATE (PF) 100 MCG/2ML IJ SOLN
INTRAMUSCULAR | Status: DC | PRN
  Administered 2018-09-19: 100 ug via INTRAVENOUS

## 2018-09-19 MED ORDER — ONDANSETRON HCL 4 MG PO TABS: 4.0000 mg | ORAL_TABLET | Freq: Four times a day (QID) | ORAL | Status: DC | PRN

## 2018-09-19 MED ORDER — SENNOSIDES-DOCUSATE SODIUM 8.6-50 MG PO TABS: 1.0000 | ORAL_TABLET | Freq: Every evening | ORAL | Status: DC | PRN

## 2018-09-19 MED ORDER — ASPIRIN 325 MG PO TABS
325.0000 mg | ORAL_TABLET | Freq: Every day | ORAL | Status: DC
  Administered 2018-09-19 – 2018-09-20 (×2): 325 mg via ORAL
  Filled 2018-09-19 (×2): qty 1

## 2018-09-19 MED ORDER — BUPIVACAINE-EPINEPHRINE 0.25% -1:200000 IJ SOLN
INTRAMUSCULAR | Status: DC | PRN
  Administered 2018-09-19: 20 mL

## 2018-09-19 MED ORDER — ROPIVACAINE HCL 7.5 MG/ML IJ SOLN
INTRAMUSCULAR | Status: DC | PRN
  Administered 2018-09-19: 20 mL via PERINEURAL

## 2018-09-19 MED ORDER — CEFAZOLIN SODIUM-DEXTROSE 1-4 GM/50ML-% IV SOLN
1.0000 g | Freq: Three times a day (TID) | INTRAVENOUS | Status: AC
  Administered 2018-09-19 – 2018-09-20 (×2): 1 g via INTRAVENOUS
  Filled 2018-09-19 (×2): qty 50

## 2018-09-19 MED ORDER — LACTATED RINGERS IV SOLN
Freq: Once | INTRAVENOUS | Status: AC
  Administered 2018-09-19: 12:00:00 via INTRAVENOUS

## 2018-09-19 MED ORDER — FENTANYL CITRATE (PF) 100 MCG/2ML IJ SOLN
INTRAMUSCULAR | Status: AC
  Administered 2018-09-19: 50 ug via INTRAVENOUS
  Filled 2018-09-19: qty 2

## 2018-09-19 MED ORDER — PROPOFOL 10 MG/ML IV BOLUS
INTRAVENOUS | Status: AC
  Filled 2018-09-19: qty 20

## 2018-09-19 MED ORDER — HYDROMORPHONE HCL 1 MG/ML IJ SOLN: 0.5000 mg | INTRAMUSCULAR | Status: DC | PRN

## 2018-09-19 MED ORDER — METHOCARBAMOL 1000 MG/10ML IJ SOLN
500.0000 mg | Freq: Four times a day (QID) | INTRAVENOUS | Status: DC | PRN
  Filled 2018-09-19: qty 5

## 2018-09-19 MED ORDER — BUPIVACAINE-EPINEPHRINE (PF) 0.25% -1:200000 IJ SOLN
INTRAMUSCULAR | Status: AC
  Filled 2018-09-19: qty 30

## 2018-09-19 MED ORDER — OXYCODONE-ACETAMINOPHEN 5-325 MG PO TABS
1.0000 | ORAL_TABLET | Freq: Four times a day (QID) | ORAL | Status: DC | PRN
  Administered 2018-09-19 – 2018-09-20 (×2): 1 via ORAL
  Administered 2018-09-20: 2 via ORAL
  Filled 2018-09-19 (×4): qty 1

## 2018-09-19 MED ORDER — CHLORHEXIDINE GLUCONATE 4 % EX LIQD: 60.0000 mL | Freq: Once | CUTANEOUS | Status: DC

## 2018-09-19 MED ORDER — SUGAMMADEX SODIUM 200 MG/2ML IV SOLN
INTRAVENOUS | Status: DC | PRN
  Administered 2018-09-19: 230 mg via INTRAVENOUS

## 2018-09-19 MED ORDER — FLUOXETINE HCL 10 MG PO CAPS
10.0000 mg | ORAL_CAPSULE | Freq: Every day | ORAL | Status: DC
  Filled 2018-09-19: qty 1

## 2018-09-19 MED ORDER — LISINOPRIL 10 MG PO TABS
10.0000 mg | ORAL_TABLET | Freq: Every day | ORAL | Status: DC
  Administered 2018-09-19 – 2018-09-20 (×2): 10 mg via ORAL
  Filled 2018-09-19 (×2): qty 1

## 2018-09-19 MED ORDER — MIDAZOLAM HCL 2 MG/2ML IJ SOLN
INTRAMUSCULAR | Status: AC
  Filled 2018-09-19: qty 2

## 2018-09-19 MED ORDER — MIDAZOLAM HCL 2 MG/2ML IJ SOLN
2.0000 mg | Freq: Once | INTRAMUSCULAR | Status: AC
  Administered 2018-09-19: 2 mg via INTRAVENOUS

## 2018-09-19 MED ORDER — MIDAZOLAM HCL 2 MG/2ML IJ SOLN
INTRAMUSCULAR | Status: AC
  Administered 2018-09-19: 2 mg via INTRAVENOUS
  Filled 2018-09-19: qty 2

## 2018-09-19 MED ORDER — PROPOFOL 10 MG/ML IV BOLUS
INTRAVENOUS | Status: DC | PRN
  Administered 2018-09-19: 180 mg via INTRAVENOUS

## 2018-09-19 MED ORDER — ROCURONIUM BROMIDE 50 MG/5ML IV SOSY
PREFILLED_SYRINGE | INTRAVENOUS | Status: AC
  Filled 2018-09-19: qty 5

## 2018-09-19 MED ORDER — OXYCODONE HCL 5 MG/5ML PO SOLN: 5.0000 mg | Freq: Once | ORAL | Status: DC | PRN

## 2018-09-19 MED ORDER — LIDOCAINE 2% (20 MG/ML) 5 ML SYRINGE
INTRAMUSCULAR | Status: AC
  Filled 2018-09-19: qty 5

## 2018-09-19 MED ORDER — DEXMEDETOMIDINE HCL IN NACL 200 MCG/50ML IV SOLN
INTRAVENOUS | Status: DC | PRN
  Administered 2018-09-19 (×5): 4 ug via INTRAVENOUS

## 2018-09-19 MED ORDER — EPHEDRINE SULFATE-NACL 50-0.9 MG/10ML-% IV SOSY
PREFILLED_SYRINGE | INTRAVENOUS | Status: DC | PRN
  Administered 2018-09-19: 10 mg via INTRAVENOUS

## 2018-09-19 MED ORDER — PROMETHAZINE HCL 25 MG/ML IJ SOLN: 6.2500 mg | INTRAMUSCULAR | Status: DC | PRN

## 2018-09-19 MED ORDER — METOCLOPRAMIDE HCL 5 MG/ML IJ SOLN: 5.0000 mg | Freq: Three times a day (TID) | INTRAMUSCULAR | Status: DC | PRN

## 2018-09-19 MED ORDER — FENTANYL CITRATE (PF) 100 MCG/2ML IJ SOLN: 25.0000 ug | INTRAMUSCULAR | Status: DC | PRN

## 2018-09-19 SURGICAL SUPPLY — 55 items
BANDAGE ACE 4X5 VEL STRL LF (GAUZE/BANDAGES/DRESSINGS) ×2 IMPLANT
BANDAGE ACE 6X5 VEL STRL LF (GAUZE/BANDAGES/DRESSINGS) ×2 IMPLANT
BANDAGE ELASTIC 6 VELCRO ST LF (GAUZE/BANDAGES/DRESSINGS) ×2 IMPLANT
BANDAGE ESMARK 6X9 LF (GAUZE/BANDAGES/DRESSINGS) ×1 IMPLANT
BENZOIN TINCTURE PRP APPL 2/3 (GAUZE/BANDAGES/DRESSINGS) ×2 IMPLANT
BNDG COHESIVE 6X5 TAN STRL LF (GAUZE/BANDAGES/DRESSINGS) ×2 IMPLANT
BNDG ESMARK 6X9 LF (GAUZE/BANDAGES/DRESSINGS) ×2
COVER SURGICAL LIGHT HANDLE (MISCELLANEOUS) ×2 IMPLANT
CUFF TOURNIQUET SINGLE 34IN LL (TOURNIQUET CUFF) ×2 IMPLANT
DRAPE INCISE IOBAN 66X45 STRL (DRAPES) ×2 IMPLANT
DRAPE U-SHAPE 47X51 STRL (DRAPES) ×2 IMPLANT
DRSG ADAPTIC 3X8 NADH LF (GAUZE/BANDAGES/DRESSINGS) IMPLANT
DRSG EMULSION OIL 3X3 NADH (GAUZE/BANDAGES/DRESSINGS) IMPLANT
DRSG PAD ABDOMINAL 8X10 ST (GAUZE/BANDAGES/DRESSINGS) ×4 IMPLANT
ELECT REM PT RETURN 9FT ADLT (ELECTROSURGICAL) ×2
ELECTRODE REM PT RTRN 9FT ADLT (ELECTROSURGICAL) ×1 IMPLANT
GAUZE SPONGE 4X4 12PLY STRL (GAUZE/BANDAGES/DRESSINGS) ×2 IMPLANT
GAUZE XEROFORM 5X9 LF (GAUZE/BANDAGES/DRESSINGS) ×2 IMPLANT
GLOVE BIOGEL PI IND STRL 8 (GLOVE) ×2 IMPLANT
GLOVE BIOGEL PI INDICATOR 8 (GLOVE) ×2
GLOVE ORTHO TXT STRL SZ7.5 (GLOVE) ×4 IMPLANT
GOWN STRL REUS W/ TWL LRG LVL3 (GOWN DISPOSABLE) ×1 IMPLANT
GOWN STRL REUS W/ TWL XL LVL3 (GOWN DISPOSABLE) ×1 IMPLANT
GOWN STRL REUS W/TWL 2XL LVL3 (GOWN DISPOSABLE) ×2 IMPLANT
GOWN STRL REUS W/TWL LRG LVL3 (GOWN DISPOSABLE) ×1
GOWN STRL REUS W/TWL XL LVL3 (GOWN DISPOSABLE) ×1
IMMOBILIZER KNEE 22 UNIV (SOFTGOODS) ×2 IMPLANT
KIT BASIN OR (CUSTOM PROCEDURE TRAY) ×2 IMPLANT
KIT TURNOVER KIT B (KITS) ×2 IMPLANT
MANIFOLD NEPTUNE II (INSTRUMENTS) ×2 IMPLANT
NEEDLE 22X1 1/2 (OR ONLY) (NEEDLE) IMPLANT
NS IRRIG 1000ML POUR BTL (IV SOLUTION) ×2 IMPLANT
PACK ORTHO EXTREMITY (CUSTOM PROCEDURE TRAY) ×2 IMPLANT
PAD ARMBOARD 7.5X6 YLW CONV (MISCELLANEOUS) ×4 IMPLANT
PAD CAST 4YDX4 CTTN HI CHSV (CAST SUPPLIES) ×2 IMPLANT
PADDING CAST COTTON 4X4 STRL (CAST SUPPLIES) ×2
PASSER SUT SWANSON 36MM LOOP (INSTRUMENTS) ×2 IMPLANT
SPONGE LAP 4X18 RFD (DISPOSABLE) ×4 IMPLANT
STAPLER VISISTAT 35W (STAPLE) ×2 IMPLANT
STOCKINETTE IMPERVIOUS LG (DRAPES) ×2 IMPLANT
STRIP CLOSURE SKIN 1/2X4 (GAUZE/BANDAGES/DRESSINGS) ×2 IMPLANT
SUCTION FRAZIER HANDLE 10FR (MISCELLANEOUS) ×1
SUCTION TUBE FRAZIER 10FR DISP (MISCELLANEOUS) ×1 IMPLANT
SUT FIBERWIRE #2 38 REV NDL BL (SUTURE) ×6
SUT FIBERWIRE #2 38 T-5 BLUE (SUTURE) ×2
SUT VIC AB 0 CT1 27 (SUTURE) ×1
SUT VIC AB 0 CT1 27XBRD ANBCTR (SUTURE) ×1 IMPLANT
SUT VIC AB 2-0 CT1 27 (SUTURE) ×2
SUT VIC AB 2-0 CT1 TAPERPNT 27 (SUTURE) ×2 IMPLANT
SUTURE FIBERWR #2 38 T-5 BLUE (SUTURE) ×1 IMPLANT
SUTURE FIBERWR#2 38 REV NDL BL (SUTURE) ×3 IMPLANT
SYR CONTROL 10ML LL (SYRINGE) IMPLANT
TOWEL OR 17X26 10 PK STRL BLUE (TOWEL DISPOSABLE) ×2 IMPLANT
TUBE CONNECTING 12X1/4 (SUCTIONS) ×2 IMPLANT
YANKAUER SUCT BULB TIP NO VENT (SUCTIONS) ×2 IMPLANT

## 2018-09-19 NOTE — Anesthesia Procedure Notes (Signed)
Anesthesia Regional Block: Femoral nerve block   Pre-Anesthetic Checklist: ,, timeout performed, Correct Patient, Correct Site, Correct Laterality, Correct Procedure, Correct Position, site marked, Risks and benefits discussed,  Surgical consent,  Pre-op evaluation,  At surgeon's request and post-op pain management  Laterality: Left  Prep: chloraprep       Needles:  Injection technique: Single-shot  Needle Type: Echogenic Needle     Needle Length: 5cm  Needle Gauge: 21     Additional Needles:   Narrative:  Start time: 09/19/2018 2:34 PM End time: 09/19/2018 2:38 PM Injection made incrementally with aspirations every 5 mL.  Performed by: Personally  Anesthesiologist: Beryle Lathe, MD  Additional Notes: No pain on injection. No increased resistance to injection. Injection made in 5cc increments. Good needle visualization. Patient tolerated the procedure well.

## 2018-09-19 NOTE — Anesthesia Preprocedure Evaluation (Addendum)
Anesthesia Evaluation  Patient identified by MRN, date of birth, ID band Patient awake    Reviewed: Allergy & Precautions, NPO status , Patient's Chart, lab work & pertinent test results  History of Anesthesia Complications Negative for: history of anesthetic complications  Airway Mallampati: II  TM Distance: >3 FB Neck ROM: Full    Dental  (+) Dental Advisory Given, Teeth Intact   Pulmonary asthma (Childhood, resolved) , Current Smoker,    breath sounds clear to auscultation       Cardiovascular Exercise Tolerance: Good hypertension, Pt. on medications  Rhythm:Regular Rate:Normal     Neuro/Psych PSYCHIATRIC DISORDERS Anxiety negative neurological ROS     GI/Hepatic Neg liver ROS, hiatal hernia, GERD  Medicated and Controlled,  Endo/Other   Obesity   Renal/GU negative Renal ROS     Musculoskeletal negative musculoskeletal ROS (+)   Abdominal   Peds  Hematology negative hematology ROS (+)   Anesthesia Other Findings   Reproductive/Obstetrics                            Anesthesia Physical Anesthesia Plan  ASA: II  Anesthesia Plan: General   Post-op Pain Management:  Regional for Post-op pain   Induction: Intravenous  PONV Risk Score and Plan: 2 and Treatment may vary due to age or medical condition, Ondansetron, Dexamethasone and Midazolam  Airway Management Planned: Oral ETT  Additional Equipment: None  Intra-op Plan:   Post-operative Plan: Extubation in OR  Informed Consent: I have reviewed the patients History and Physical, chart, labs and discussed the procedure including the risks, benefits and alternatives for the proposed anesthesia with the patient or authorized representative who has indicated his/her understanding and acceptance.   Dental advisory given  Plan Discussed with: CRNA and Anesthesiologist  Anesthesia Plan Comments:        Anesthesia Quick  Evaluation

## 2018-09-19 NOTE — Transfer of Care (Signed)
Immediate Anesthesia Transfer of Care Note  Patient: Justin Townsend  Procedure(s) Performed: REPAIR LEFT KNEE QUADRICEPS TENDON (Left Leg Upper)  Patient Location: PACU  Anesthesia Type:GA combined with regional for post-op pain  Level of Consciousness: awake and patient cooperative  Airway & Oxygen Therapy: Patient Spontanous Breathing  Post-op Assessment: Report given to RN and Post -op Vital signs reviewed and stable  Post vital signs: Reviewed and stable  Last Vitals:  Vitals Value Taken Time  BP 129/86 09/19/2018  3:57 PM  Temp    Pulse 96 09/19/2018  3:59 PM  Resp 18 09/19/2018  3:59 PM  SpO2 86 % 09/19/2018  3:59 PM  Vitals shown include unvalidated device data.  Last Pain:  Vitals:   09/19/18 1122  TempSrc: Oral         Complications: No apparent anesthesia complications

## 2018-09-19 NOTE — Addendum Note (Signed)
Addendum  created 09/19/18 1803 by Arta Bruce, MD   Attestation recorded in Intraprocedure, Intraprocedure Attestations filed

## 2018-09-19 NOTE — H&P (Signed)
Justin Townsend is an 57 y.o. male.   Chief Complaint: Left quadriceps tendon tear knee pain HPI: With history of left quadriceps tendon tear pain presents for surgical intervention.  Injury the result of a slip and fall  Past Medical History:  Diagnosis Date  . Anxiety   . Asthma    only as child; resolved by age 21  . Fatty liver   . Gallstones   . GERD (gastroesophageal reflux disease)   . History of hiatal hernia   . Hyperlipidemia   . Hypertension   . Pneumonia     Past Surgical History:  Procedure Laterality Date  . no colonoscopy     SOC reviewed  . ORIF TOE FRACTURE Right    foot  . Thumb Surgery Left    pinning post dislocation    Family History  Problem Relation Age of Onset  . Ulcers Mother   . Cirrhosis Mother        ? Alcohol Related   . Cancer Paternal Grandfather        CNS Cancer  . Panic disorder Other        Maternal FH  . Anxiety disorder Other        Maternal FH  . Heart attack Maternal Grandfather        >55  . Diabetes Neg Hx   . Stroke Neg Hx   . Colon cancer Neg Hx   . Colon polyps Neg Hx    Social History:  reports that he has been smoking cigars. He has never used smokeless tobacco. He reports that he drinks about 10.0 standard drinks of alcohol per week. He reports that he does not use drugs.  Allergies: No Known Allergies  No medications prior to admission.    No results found for this or any previous visit (from the past 48 hour(s)). No results found.  Review of Systems  Constitutional: Negative.   HENT: Negative.   Respiratory: Negative.   Cardiovascular: Negative.   Gastrointestinal: Negative.   Genitourinary: Negative.   Musculoskeletal: Positive for falls and joint pain.  Skin: Negative.   Neurological: Negative.   Psychiatric/Behavioral: Negative.     There were no vitals taken for this visit. Physical Exam  Constitutional: He is oriented to person, place, and time. He appears well-nourished. No distress.  HENT:   Head: Normocephalic and atraumatic.  Eyes: Pupils are equal, round, and reactive to light. EOM are normal.  Neck: Normal range of motion.  Respiratory: No respiratory distress.  GI: He exhibits no distension.  Musculoskeletal:  Gait antalgic.  Left knee positive effusion.  Some bruising around medial thigh.  Collateral ligaments are stable.  Tender over the patella insertion and there is a small defect.  Patient is still able actively extend his knee.  Calf nontender.  Neurovascular intact.  Neurological: He is alert and oriented to person, place, and time.  Skin: Skin is warm and dry.  Psychiatric: He has a normal mood and affect.     Assessment/Plan Left quadriceps tendon tear  Proceed with left quadriceps tendon repair as scheduled.  Start procedure along with potential rehab/recovery time discussed.  All questions answered and wishes to proceed.  Zonia Kief, PA-C 09/19/2018, 10:27 AM

## 2018-09-19 NOTE — Anesthesia Postprocedure Evaluation (Signed)
Anesthesia Post Note  Patient: FAVIAN KITTLESON  Procedure(s) Performed: REPAIR LEFT KNEE QUADRICEPS TENDON (Left Leg Upper)     Patient location during evaluation: PACU Anesthesia Type: General Level of consciousness: awake and alert Pain management: pain level controlled Vital Signs Assessment: post-procedure vital signs reviewed and stable Respiratory status: spontaneous breathing, nonlabored ventilation, respiratory function stable and patient connected to nasal cannula oxygen Cardiovascular status: blood pressure returned to baseline and stable Postop Assessment: no apparent nausea or vomiting Anesthetic complications: no    Last Vitals:  Vitals:   09/19/18 1730 09/19/18 1745  BP: (!) 145/86 (!) 150/96  Pulse: 89 89  Resp: 13 14  Temp:  36.6 C  SpO2: 94% 94%    Last Pain:  Vitals:   09/19/18 1730  TempSrc:   PainSc: 0-No pain                 Kazandra Forstrom DAVID

## 2018-09-19 NOTE — Op Note (Signed)
Preop diagnosis: Left quad tendon rupture  Postop diagnosis: Same  Procedure: Left quad tendon repair.  Surgeon: Annell Greening MD  Assistant Zonia Kief, PA-C medically necessary and present with entire procedure  Anesthesia: General plus Marcaine skin local  Tourniquet: Less than 1 hour.  Procedure after induction general anesthesia 2 g Ancef timeout procedure proximal thigh tourniquet prepping with DuraPrep usual impervious stockinette Covan extremity sheets and drapes were applied.  Leg was wrapped in Esmarch tourniquet inflated sterile skin marker was used and Betadine sticks Steri-Drape had been applied.  Midline incision was made starting from the mid pole the patella extending proximally a distance of 8 cm.  Subtenons tissue was sharply dissected down to the superficial retinaculum and immediately opening this there was a large gush of serosanguineous fluid that extended directly from out of the joint and the joint was suctioned dry.  There is quad tendon rupture centrally which extended both medially into the VMO and extended slightly further over the vastus lateralis in a transverse fashion.  There was a cuff of 2 cm of quad tendon still attached to the patella.  Small area of calcification on the proximal piece of the tendon was resected.  Edges were freshened up and then multiple #1 FiberWire sutures were placed mattress fashion Tajema Kessler modification.  Knots were tied in the middle of the tendon.  Additional figure-of-eight sutures were used medially and laterally in the gutter for reapproximation of the muscle.  Top portion was run with additional #1 FiberWire for additional reinforcement to patient's history of noncompliance.  Copious irrigation was performed tourniquet deflated 2-0 Vicryl subtenons tissue skin staple closure Marcaine infiltration in the skin 4 x 4's after Xeroform ABD Ace wrap and long knee immobilizer was applied.  Patient tolerated the procedure well and was  transferred to recovery room in stable condition.  Tourniquet was deflated prior to closure.

## 2018-09-19 NOTE — Plan of Care (Signed)

## 2018-09-19 NOTE — Anesthesia Procedure Notes (Signed)
Procedure Name: Intubation Date/Time: 09/19/2018 2:58 PM Performed by: Rosiland Oz, CRNA Pre-anesthesia Checklist: Patient identified, Emergency Drugs available, Suction available, Patient being monitored and Timeout performed Patient Re-evaluated:Patient Re-evaluated prior to induction Oxygen Delivery Method: Circle system utilized Preoxygenation: Pre-oxygenation with 100% oxygen Induction Type: IV induction Ventilation: Mask ventilation without difficulty Laryngoscope Size: Miller and 3 Grade View: Grade II Tube type: Oral Tube size: 7.5 mm Number of attempts: 1 Airway Equipment and Method: Stylet Placement Confirmation: ETT inserted through vocal cords under direct vision,  positive ETCO2 and breath sounds checked- equal and bilateral Secured at: 22 cm Tube secured with: Tape Dental Injury: Teeth and Oropharynx as per pre-operative assessment

## 2018-09-19 NOTE — Interval H&P Note (Signed)
History and Physical Interval Note:  09/19/2018 1:24 PM  Justin Townsend  has presented today for surgery, with the diagnosis of left knee quadriceps tendon tear  The various methods of treatment have been discussed with the patient and family. After consideration of risks, benefits and other options for treatment, the patient has consented to  Procedure(s): REPAIR LEFT KNEE QUADRICEPS TENDON (Left) as a surgical intervention .  The patient's history has been reviewed, patient examined, no change in status, stable for surgery.  I have reviewed the patient's chart and labs.  Questions were answered to the patient's satisfaction.     Eldred Manges

## 2018-09-20 ENCOUNTER — Encounter (HOSPITAL_COMMUNITY): Payer: Self-pay | Admitting: Orthopaedic Surgery

## 2018-09-20 DIAGNOSIS — S76112A Strain of left quadriceps muscle, fascia and tendon, initial encounter: Secondary | ICD-10-CM | POA: Diagnosis not present

## 2018-09-20 MED ORDER — OXYCODONE-ACETAMINOPHEN 5-325 MG PO TABS: 1.0000 | ORAL_TABLET | ORAL | 0 refills | Status: DC | PRN

## 2018-09-20 MED ORDER — METHOCARBAMOL 500 MG PO TABS: 500.0000 mg | ORAL_TABLET | Freq: Four times a day (QID) | ORAL | 0 refills | Status: DC | PRN

## 2018-09-20 NOTE — Plan of Care (Signed)

## 2018-09-20 NOTE — Progress Notes (Signed)
   Subjective: 1 Day Post-Op Procedure(s) (LRB): REPAIR LEFT KNEE QUADRICEPS TENDON (Left) Patient reports pain as 1 on 0-10 scale.    Objective: Vital signs in last 24 hours: Temp:  [97.9 F (36.6 C)-98.6 F (37 C)] 98.6 F (37 C) (10/30 2008) Pulse Rate:  [67-103] 67 (10/31 0500) Resp:  [12-18] 18 (10/31 0500) BP: (119-159)/(73-96) 119/73 (10/31 0500) SpO2:  [92 %-100 %] 94 % (10/31 0500) Weight:  [113.4 kg] 113.4 kg (10/30 1122)  Intake/Output from previous day: 10/30 0701 - 10/31 0700 In: 1290 [P.O.:240; I.V.:1000] Out: 855 [Urine:850; Blood:5] Intake/Output this shift: No intake/output data recorded.  Recent Labs    09/19/18 1204  HGB 16.3   Recent Labs    09/19/18 1204  WBC 7.6  RBC 5.27  HCT 50.3  PLT 238   Recent Labs    09/19/18 1204  NA 140  K 4.0  CL 109  CO2 21*  BUN 13  CREATININE 0.90  GLUCOSE 96  CALCIUM 9.1   No results for input(s): LABPT, INR in the last 72 hours.  Neurologically intact, block not worn off.   No results found.  Assessment/Plan: 1 Day Post-Op Procedure(s) (LRB): REPAIR LEFT KNEE QUADRICEPS TENDON (Left) Plan: discharge home office one week  Eldred Manges 09/20/2018, 7:21 AM

## 2018-09-20 NOTE — Progress Notes (Signed)
Discharge instructions completed with pt.  Pt verbalized understanding of the information.  Pt denies chest pain, shortness of breath, dizziness, lightheadedness, and n/v.  Pt's IV discontinued.  Pt discharged home.  

## 2018-09-20 NOTE — Evaluation (Addendum)
Occupational Therapy Evaluation Patient Details Name: Justin Townsend MRN: [id number] DOB: 1961/01/26 Today's Date: 09/20/2018    History of Present Illness Patient is a 57 y/o male who presents s/p left quad tendon repair. PMh includes HTN, HLD, anxiety.    Clinical Impression   PTA Pt independent in ADL and mobility. Has been functioning in home environment for about a week prior to sx. Pt is currently min guard with crutches for transfer. Educated in compensatory strategies for ADL with LB and safety for grooming tasks in standing with WB precautions. Pt also educated in use of 3 in 1 for safety with toileting, and showering. OT education complete. OT to defer further therapy at follow up appointment with MD - and once Pt's WB status changes.     Follow Up Recommendations  Follow surgeon's recommendation for DC plan and follow-up therapies    Equipment Recommendations  3 in 1 bedside commode(Pt declined from hospital - will get from outside source)    Recommendations for Other Services       Precautions / Restrictions Precautions Precautions: Fall Required Braces or Orthoses: Knee Immobilizer - Left Knee Immobilizer - Left: On at all times Restrictions Weight Bearing Restrictions: Yes LLE Weight Bearing: Touchdown weight bearing      Mobility Bed Mobility               General bed mobility comments: Pt OOB in recliner when OT arrived  Transfers Overall transfer level: Needs assistance Equipment used: Crutches Transfers: Sit to/from Stand Sit to Stand: Min guard         General transfer comment: good crutch placement for transfer    Balance Overall balance assessment: Needs assistance Sitting-balance support: Feet supported;No upper extremity supported Sitting balance-Leahy Scale: Good     Standing balance support: During functional activity Standing balance-Leahy Scale: Fair Standing balance comment: requires at least 1 UE support                            ADL either performed or assessed with clinical judgement   ADL Overall ADL's : Needs assistance/impaired Eating/Feeding: Independent   Grooming: Modified independent;Sitting Grooming Details (indicate cue type and reason): Pt unable to maintain TDW in standing and perform BUE tasks at this time - educated Pt to use seated position for safety Upper Body Bathing: Set up;Sitting;With caregiver independent assisting Upper Body Bathing Details (indicate cue type and reason): plans on sponge bathing wuith assist from girlfriend Lower Body Bathing: Moderate assistance;With caregiver independent assisting;Sitting/lateral leans   Upper Body Dressing : Set up;Sitting   Lower Body Dressing: Moderate assistance;With caregiver independent assisting;Sit to/from stand Lower Body Dressing Details (indicate cue type and reason): educated to dress LLE first Toilet Transfer: Min guard;Ambulation(crutches)   Toileting- Clothing Manipulation and Hygiene: Supervision/safety;Sitting/lateral Biomedical scientist Details (indicate cue type and reason): Pt plans on sponge bathing with assist from girlfriend Functional mobility during ADLs: Min guard;Cueing for safety(cruches)       Vision Patient Visual Report: No change from baseline       Perception     Praxis      Pertinent Vitals/Pain Pain Assessment: 0-10 Pain Score: 4  Pain Location: left knee Pain Descriptors / Indicators: Sore;Operative site guarding Pain Intervention(s): Monitored during session;Repositioned     Hand Dominance Left   Extremity/Trunk Assessment Upper Extremity Assessment Upper Extremity Assessment: Overall WFL for tasks assessed   Lower Extremity Assessment Lower Extremity  Assessment: Defer to PT evaluation   Cervical / Trunk Assessment Cervical / Trunk Assessment: Normal   Communication Communication Communication: No difficulties   Cognition Arousal/Alertness: Awake/alert Behavior  During Therapy: WFL for tasks assessed/performed(quick to move, eager to please) Overall Cognitive Status: Within Functional Limits for tasks assessed                                     General Comments  girlfriend present halfway through session - good recall of information    Exercises     Shoulder Instructions      Home Living Family/patient expects to be discharged to:: Private residence Living Arrangements: Spouse/significant other Available Help at Discharge: Family;Available 24 hours/day Type of Home: House Home Access: Stairs to enter [employer] of Steps: 3 Entrance Stairs-Rails: Left Home Layout: Two level;Able to live on main level with bedroom/bathroom Alternate Level Stairs-Number of Steps: 1 flight   Bathroom Shower/Tub: Chief Strategy Officer: Standard     Home Equipment: Crutches          Prior Functioning/Environment Level of Independence: Independent        Comments: Drives trucks        OT Problem List: Decreased range of motion;Decreased activity tolerance;Impaired balance (sitting and/or standing);Decreased knowledge of use of DME or AE;Decreased knowledge of precautions;Pain      OT Treatment/Interventions:      OT Goals(Current goals can be found in the care plan section) Acute Rehab OT Goals Patient Stated Goal: "get home" OT Goal Formulation: With patient/family Time For Goal Achievement: 10/04/18 Potential to Achieve Goals: Good  OT Frequency:     Barriers to D/C:            Co-evaluation              AM-PAC PT "6 Clicks" Daily Activity     Outcome Measure Help from another person eating meals?: None Help from another person taking care of personal grooming?: None(in sitting) Help from another person toileting, which includes using toliet, bedpan, or urinal?: A Little Help from another person bathing (including washing, rinsing, drying)?: A Lot Help from another person to put on  and taking off regular upper body clothing?: None Help from another person to put on and taking off regular lower body clothing?: A Lot 6 Click Score: 19   End of Session Equipment Utilized During Treatment: Gait belt;Other (comment)(crutches) Nurse Communication: Mobility status  Activity Tolerance: Patient tolerated treatment well Patient left: in chair;with call bell/phone within reach;with family/visitor present                   Time: 1610-9604 OT Time Calculation (min): 23 min Charges:  OT General Charges $OT Visit: 1 Visit OT Evaluation $OT Eval Moderate Complexity: 1 Mod Sherryl Manges OTR/L Acute Rehabilitation Services Pager: [phone] Office: [phone]  Evern Bio Benigno Check 09/20/2018, 3:46 PM   OF Note: Due to caseload demands, OT attempted to place plan of care orders, and since it had been more than 2 hours - the EMR system would not allow me to file/sign it.

## 2018-09-20 NOTE — Discharge Instructions (Signed)
Keep immobilizer on tight. May work on moving toes and ankle. Ok to do leg lifts with immobilizer on. See Dr. Ophelia Charter in one week.

## 2018-09-20 NOTE — Clinical Social Work Note (Signed)
CSW acknowledges SNF consult. Patient has orders to discharge home today.  CSW signing off.  Kehinde Totzke, CSW [phone]  

## 2018-09-20 NOTE — Evaluation (Addendum)
Physical Therapy Evaluation Patient Details Name: Justin Townsend MRN: [id number] DOB: [birth year] Today's Date: 09/20/2018   History of Present Illness  Patient is a 57 y/o male who presents s/p left quad tendon repair. PMh includes HTN, HLD, anxiety.   Clinical Impression  Patient presents with pain and post surgical deficits s/p above surgery. Pt independent PTA and working driving trucks. Tolerated transfers and gait training with Min A for balance/safety. Requires cues for WB status throughout mobility esp when fatigued. Performed stair training with Min guard-Min A for balance using bil crutches and rail for support. Performed family training with wife present. Pt with LOB on stairs due to trying to go too fast. Would likely do well with RW but does not want to try one. Education re: stair negotiation, crutches, transfers, elevation, exercises, WB status etc. Will follow acutely to maximize independence and mobility prior to return home.    Follow Up Recommendations Supervision for mobility/OOB;No PT follow up    Equipment Recommendations  None recommended by PT    Recommendations for Other Services       Precautions / Restrictions Precautions Precautions: Fall Required Braces or Orthoses: Knee Immobilizer - Left Knee Immobilizer - Left: On at all times Restrictions Weight Bearing Restrictions: Yes LLE Weight Bearing: Touchdown weight bearing      Mobility  Bed Mobility               General bed mobility comments: Sitting EOB upon PT arrival.   Transfers Overall transfer level: Needs assistance Equipment used: Crutches Transfers: Sit to/from Stand Sit to Stand: Min guard         General transfer comment: Min guard for safety. Cues for hand placement/technique with crutches in one hand. Transferred to chair post ambulation.  Ambulation/Gait Ambulation/Gait assistance: Min guard Gait Distance (Feet): 100 Feet Assistive device: Crutches Gait Pattern/deviations:  Step-to pattern;Decreased stance time - left;Trunk flexed     General Gait Details: Started with step to gait but placing too much weight through LLE so instructed pt in swing to gait; fatigues. Cues to decrease speed, unsteady at times.   Stairs Stairs: Yes Stairs assistance: Min assist;Min guard Stair Management: Forwards;With crutches;One rail Right Number of Stairs: 3(x5) General stair comments: Instructed pt to perform with crutch under RUE and rail on left, performed x3; then performed with bil crutches. Wife stepped in to assist to practice for home. LOB when going too fast.  Wheelchair Mobility    Modified Rankin (Stroke Patients Only)       Balance Overall balance assessment: Needs assistance Sitting-balance support: Feet supported;No upper extremity supported Sitting balance-Leahy Scale: Good     Standing balance support: During functional activity Standing balance-Leahy Scale: Fair Standing balance comment: Requires at least 1 UE support, able to assis tpulling up shorts.                             Pertinent Vitals/Pain Pain Assessment: 0-10 Pain Score: 3  Pain Location: left knee Pain Descriptors / Indicators: Sore;Operative site guarding Pain Intervention(s): Monitored during session;Repositioned    Home Living Family/patient expects to be discharged to:: Private residence Living Arrangements: Spouse/significant other Available Help at Discharge: Family;Available 24 hours/day Type of Home: House Home Access: Stairs to enter Entrance Stairs-Rails: Left Entrance Stairs-Number of Steps: 3 Home Layout: Two level;Able to live on main level with bedroom/bathroom Home Equipment: Crutches      Prior Function Level of Independence: Independent  Comments: Drives trucks     Hand Dominance   Dominant Hand: Left    Extremity/Trunk Assessment   Upper Extremity Assessment Upper Extremity Assessment: Defer to OT evaluation    Lower  Extremity Assessment Lower Extremity Assessment: LLE deficits/detail LLE Deficits / Details: Ankle AROM WFL. Limited asssessment due to immobilization and precautions LLE: Unable to fully assess due to immobilization LLE Sensation: WNL       Communication   Communication: No difficulties  Cognition Arousal/Alertness: Awake/alert Behavior During Therapy: Impulsive Overall Cognitive Status: Within Functional Limits for tasks assessed                                        General Comments General comments (skin integrity, edema, etc.): Wife present during session.    Exercises General Exercises - Lower Extremity Ankle Circles/Pumps: Left;15 reps;Supine   Assessment/Plan    PT Assessment Patient needs continued PT services  PT Problem List Decreased mobility;Decreased activity tolerance;Decreased balance;Pain;Decreased knowledge of use of DME;Decreased skin integrity;Decreased range of motion       PT Treatment Interventions Functional mobility training;Balance training;Patient/family education;Gait training;DME instruction;Therapeutic activities;Stair training;Therapeutic exercise    PT Goals (Current goals can be found in the Care Plan section)  Acute Rehab PT Goals Patient Stated Goal: to get home PT Goal Formulation: With patient Time For Goal Achievement: 10/04/18 Potential to Achieve Goals: Good    Frequency Min 3X/week   Barriers to discharge Inaccessible home environment stairs to enter home    Co-evaluation               AM-PAC PT "6 Clicks" Daily Activity  Outcome Measure Difficulty turning over in bed (including adjusting bedclothes, sheets and blankets)?: A Little Difficulty moving from lying on back to sitting on the side of the bed? : A Little Difficulty sitting down on and standing up from a chair with arms (e.g., wheelchair, bedside commode, etc,.)?: A Lot Help needed moving to and from a bed to chair (including a wheelchair)?: A  Little Help needed walking in hospital room?: A Little Help needed climbing 3-5 steps with a railing? : A Little 6 Click Score: 17    End of Session Equipment Utilized During Treatment: Left knee immobilizer;Gait belt Activity Tolerance: Patient tolerated treatment well Patient left: in chair;with call bell/phone within reach;with family/visitor present Nurse Communication: Mobility status PT Visit Diagnosis: Unsteadiness on feet (R26.81);Difficulty in walking, not elsewhere classified (R26.2);Pain Pain - Right/Left: Left Pain - part of body: Leg    Time: 0830-0910 PT Time Calculation (min) (ACUTE ONLY): 40 min   Charges:   PT Evaluation $PT Eval Low Complexity: 1 Low PT Treatments $Gait Training: 8-22 mins $Therapeutic Activity: 8-22 mins        Mylo Red, PT, DPT Acute Rehabilitation Services Pager [phone] Office [phone]      Blake Divine A Elynn Patteson 09/20/2018, 11:02 AM

## 2018-09-25 ENCOUNTER — Other Ambulatory Visit: Payer: Self-pay | Admitting: Family Medicine

## 2018-09-25 DIAGNOSIS — I10 Essential (primary) hypertension: Secondary | ICD-10-CM

## 2018-09-27 ENCOUNTER — Ambulatory Visit (INDEPENDENT_AMBULATORY_CARE_PROVIDER_SITE_OTHER): Payer: PRIVATE HEALTH INSURANCE | Admitting: Surgery

## 2018-09-27 ENCOUNTER — Ambulatory Visit (HOSPITAL_COMMUNITY)
Admission: RE | Admit: 2018-09-27 | Discharge: 2018-09-27 | Disposition: A | Discharge status: Home / Self Care | Payer: PRIVATE HEALTH INSURANCE | Source: Ambulatory Visit | Attending: Surgery | Admitting: Surgery

## 2018-09-27 ENCOUNTER — Encounter (INDEPENDENT_AMBULATORY_CARE_PROVIDER_SITE_OTHER): Payer: Self-pay | Admitting: Surgery

## 2018-09-27 VITALS — BP 152/91 | HR 61 | Temp 98.3°F | Ht 71.0 in | Wt 250.0 lb

## 2018-09-27 DIAGNOSIS — M7989 Other specified soft tissue disorders: Secondary | ICD-10-CM | POA: Insufficient documentation

## 2018-09-27 DIAGNOSIS — S76112D Strain of left quadriceps muscle, fascia and tendon, subsequent encounter: Secondary | ICD-10-CM

## 2018-09-27 NOTE — Progress Notes (Signed)
57 year old white male who is 1 week status post left quadricep tendon repair returns.  Patient states that couple days ago he fell down 4 steps.  Did have his knee immobilizer on.  Has been having a considerable amount of calf swelling.  Denies chest pain shortness of breath.  Exam Wound looks good.  Staples intact.  No drainage or signs of infection.  He does have moderate to marked swelling of the left lower extremity.  Diffuse 3+ pretibial pitting edema.  Proximal calf tenderness.   Plan Recommend stat venous Doppler left lower extremity this evening to rule out DVT.  I will await callback report.  He will continue aspirin.  Follow-up with me in 1 week for staple removal and wound check  unless I need to have him come in sooner.

## 2018-09-27 NOTE — Progress Notes (Signed)
LLE venous duplex prelim: negative for DVT.  Farrel Demark, RDMS, RVT   Attempted call report to Zonia Kief at [phone]- number on schedule for call report. This is a wrong number. Office is closed so will let patient leave since negative.

## 2018-09-28 ENCOUNTER — Encounter: Payer: Self-pay | Admitting: Family Medicine

## 2018-09-28 ENCOUNTER — Encounter (INDEPENDENT_AMBULATORY_CARE_PROVIDER_SITE_OTHER): Payer: Self-pay

## 2018-09-28 NOTE — Discharge Summary (Signed)
Patient ID: Justin Townsend MRN: [id number] DOB/AGE: 08/18/1961 57 y.o.  Admit date: 09/19/2018 Discharge date: 09/28/2018  Admission Diagnoses:  Active Problems:   Quadriceps tendon rupture   Discharge Diagnoses:  Active Problems:   Quadriceps tendon rupture  status post Procedure(s): REPAIR LEFT KNEE QUADRICEPS TENDON  Past Medical History:  Diagnosis Date  . Anxiety   . Asthma    only as child; resolved by age 59  . Fatty liver   . Gallstones   . GERD (gastroesophageal reflux disease)   . History of hiatal hernia   . Hyperlipidemia   . Hypertension   . Pneumonia     Surgeries: Procedure(s): REPAIR LEFT KNEE QUADRICEPS TENDON on 09/19/2018   Consultants:   Discharged Condition: Improved  Hospital Course: Justin Townsend is an 57 y.o. male who was admitted 09/19/2018 for operative treatment of left quadriceps tendon tear.  Patient failed conservative treatments (please see the history and physical for the specifics) and had severe unremitting pain that affects sleep, daily activities and work/hobbies. After pre-op clearance, the patient was taken to the operating room on 09/19/2018 and underwent  Procedure(s): REPAIR LEFT KNEE QUADRICEPS TENDON.    Patient was given perioperative antibiotics:  Anti-infectives (From admission, onward)   Start     Dose/Rate Route Frequency Ordered Stop   09/19/18 2200  ceFAZolin (ANCEF) IVPB 1 g/50 mL premix     1 g 100 mL/hr over 30 Minutes Intravenous Every 8 hours 09/19/18 1808 09/20/18 0609   09/19/18 1130  ceFAZolin (ANCEF) IVPB 2g/100 mL premix     2 g 200 mL/hr over 30 Minutes Intravenous On call to O.R. 09/19/18 1125 09/19/18 1504       Patient was given sequential compression devices and early ambulation to prevent DVT.   Patient benefited maximally from hospital stay and there were no complications. At the time of discharge, the patient was urinating/moving their bowels without difficulty, tolerating a regular diet, pain  is controlled with oral pain medications and they have been cleared by PT/OT.   Recent vital signs: No data found.   Recent laboratory studies: No results for input(s): WBC, HGB, HCT, PLT, NA, K, CL, CO2, BUN, CREATININE, GLUCOSE, INR, CALCIUM in the last 72 hours.  Invalid input(s): PT, 2   Discharge Medications:   Allergies as of 09/20/2018   No Known Allergies     Medication List    STOP taking these medications   diphenhydrAMINE 25 MG tablet Commonly known as:  BENADRYL     TAKE these medications   ezetimibe 10 MG tablet Commonly known as:  ZETIA TAKE ONE TABLET BY MOUTH ONCE DAILY   FISH OIL ULTRA 1400 MG Caps Take 2,800 mg by mouth daily.   FLUoxetine 10 MG tablet Commonly known as:  PROZAC Take 1 tablet (10 mg total) by mouth daily.   ibuprofen 200 MG tablet Commonly known as:  ADVIL,MOTRIN Take 800 mg by mouth 2 (two) times daily as needed for moderate pain.   methocarbamol 500 MG tablet Commonly known as:  ROBAXIN Take 1 tablet (500 mg total) by mouth every 6 (six) hours as needed for muscle spasms.   MULTIVITAMIN PO Take 1 tablet by mouth daily.   omeprazole 40 MG capsule Commonly known as:  PRILOSEC Take 1 capsule (40 mg total) by mouth daily.   oxyCODONE-acetaminophen 5-325 MG tablet Commonly known as:  PERCOCET/ROXICET Take 1-2 tablets by mouth every 4 (four) hours as needed for severe pain.  Diagnostic Studies: Mr Knee Left W/o Contrast  Result Date: 09/15/2018 CLINICAL DATA:  Larey Seat 1 week ago and injured knee. Persistent pain and swelling. EXAM: MRI OF THE LEFT KNEE WITHOUT CONTRAST TECHNIQUE: Multiplanar, multisequence MR imaging of the knee was performed. No intravenous contrast was administered. COMPARISON:  Radiographs FINDINGS: MENISCI Medial meniscus:  Intact Lateral meniscus:  Intact LIGAMENTS Cruciates:  Intact Collaterals:  Intact CARTILAGE Patellofemoral:  Mild degenerative chondrosis. Medial:  Mild degenerative chondrosis.  Lateral:  Mild to moderate degenerative chondrosis. Joint:  Moderate-sized joint effusion. Popliteal Fossa:  No popliteal mass or Baker's cyst. Extensor Mechanism: Partially torn distal quadriceps tendon. This is incompletely evaluated but I believe the rectus femoris component may be torn. There is also at least partial thickness tearing along the confluence of the vastus lateralis component. I would recommend a repeat MRI of the distal thigh to help better evaluate the exact extent of the tear (if clinically necessary). Not sure whether not this will change management. The patellar tendon is intact. The patella retinacular structures are intact. Bones:  No acute bony findings. Other: Mild edema in the vastus lateralis muscle. IMPRESSION: 1. Partial quadriceps tendon tear. If clinically indicated, recommend dedicated MR imaging of the distal thigh to help better evaluate the exact extent and components of the tear. 2. The patellar tendon is intact. 3. No findings for internal derangement of the knee. 4. Moderate-sized joint effusion Electronically Signed   By: Rudie Meyer M.D.   On: 09/15/2018 15:12   Dg Knee Complete 4 Views Left  Result Date: 09/09/2018 CLINICAL DATA:  Fall yesterday with left knee pain. EXAM: LEFT KNEE - COMPLETE 4+ VIEW COMPARISON:  None. FINDINGS: Minimal early degenerative changes. No acute fracture or dislocation. Small joint effusion. IMPRESSION: No acute fracture.  Small joint effusion. Electronically Signed   By: Elberta Fortis M.D.   On: 09/09/2018 15:14   Vas [place] Lower Extremity Venous (dvt)  Result Date: 09/28/2018  Lower Venous Study Indications: Swelling.  Performing Technologist: Farrel Demark RDMS, RVT  Examination Guidelines: A complete evaluation includes B-mode imaging, spectral Doppler, color Doppler, and power Doppler as needed of all accessible portions of each vessel. Bilateral testing is considered an integral part of a complete examination. Limited examinations for  reoccurring indications may be performed as noted.  Left Venous Findings: +---------+---------------+---------+-----------+----------+-------+          CompressibilityPhasicitySpontaneityPropertiesSummary +---------+---------------+---------+-----------+----------+-------+ CFV      Full           Yes      Yes                          +---------+---------------+---------+-----------+----------+-------+ SFJ      Full                                                 +---------+---------------+---------+-----------+----------+-------+ FV Prox  Full                                                 +---------+---------------+---------+-----------+----------+-------+ FV Mid   Full                                                 +---------+---------------+---------+-----------+----------+-------+  FV DistalFull                                                 +---------+---------------+---------+-----------+----------+-------+ PFV      Full                                                 +---------+---------------+---------+-----------+----------+-------+ POP      Full           Yes      Yes                          +---------+---------------+---------+-----------+----------+-------+ PTV      Full                                                 +---------+---------------+---------+-----------+----------+-------+ PERO     Full                                                 +---------+---------------+---------+-----------+----------+-------+    Summary: Left: There is no evidence of deep vein thrombosis in the lower extremity. No cystic structure found in the popliteal fossa.  *See table(s) above for measurements and observations. Electronically signed by Lemar Livings MD on 09/28/2018 at 8:26:32 AM.    Final       Follow-up Information    Eldred Manges, MD Follow up in 1 week(s).   Specialty:  Orthopedic Surgery Contact information: [address] [place] [address] [phone]           Discharge Plan:  discharge to home  Disposition:     Signed: Zonia Kief  09/28/2018, 2:54 PM

## 2018-10-04 ENCOUNTER — Ambulatory Visit (INDEPENDENT_AMBULATORY_CARE_PROVIDER_SITE_OTHER): Payer: PRIVATE HEALTH INSURANCE | Admitting: Surgery

## 2018-10-04 ENCOUNTER — Encounter (INDEPENDENT_AMBULATORY_CARE_PROVIDER_SITE_OTHER): Payer: Self-pay | Admitting: Surgery

## 2018-10-04 DIAGNOSIS — S76112D Strain of left quadriceps muscle, fascia and tendon, subsequent encounter: Secondary | ICD-10-CM

## 2018-10-04 NOTE — Progress Notes (Signed)
57 year old male comes in for wound check and staple removal.  States that he is doing well.  Not taking any narcotic pain medication.  Has been compliant with wearing the immobilizer.  Previous venous Doppler left lower extremity was negative for DVT.  Exam Very pleasant white male alert and oriented in no acute distress.  He does have obvious left quad atrophy.  Today staples removed and Steri-Strips applied.  Incision healing well without signs of infection.  Calf is nontender.   Plan Stressed to patient the importance of being compliant with his knee immobilizer.  He was given a new one today.  Anticipate starting formal PT possibly when he is 6 weeks postop.  He is okay to do touchdown weightbearing in the immobilizer with crutches.  With the quad atrophy that he has I recommend getting muscle stim if this will help on the earlier part of his recovery.  Follow-up in 4 weeks for recheck

## 2018-10-05 ENCOUNTER — Encounter (INDEPENDENT_AMBULATORY_CARE_PROVIDER_SITE_OTHER): Payer: Self-pay

## 2018-10-05 ENCOUNTER — Telehealth (INDEPENDENT_AMBULATORY_CARE_PROVIDER_SITE_OTHER): Payer: Self-pay | Admitting: Radiology

## 2018-10-05 NOTE — Telephone Encounter (Signed)
Received email from [employer] stating that a letter was needed stating Mr. Woods next appointment date that that there has not been any determination on a return to work date as of yet. She requests this be emailed back to them.   Emailed to address given.

## 2018-10-08 ENCOUNTER — Encounter (INDEPENDENT_AMBULATORY_CARE_PROVIDER_SITE_OTHER): Payer: Self-pay | Admitting: Orthopaedic Surgery

## 2018-10-31 ENCOUNTER — Encounter (INDEPENDENT_AMBULATORY_CARE_PROVIDER_SITE_OTHER): Payer: Self-pay | Admitting: Orthopaedic Surgery

## 2018-10-31 ENCOUNTER — Ambulatory Visit (INDEPENDENT_AMBULATORY_CARE_PROVIDER_SITE_OTHER): Payer: PRIVATE HEALTH INSURANCE | Admitting: Orthopaedic Surgery

## 2018-10-31 VITALS — BP 143/96 | HR 74

## 2018-10-31 DIAGNOSIS — S76112D Strain of left quadriceps muscle, fascia and tendon, subsequent encounter: Secondary | ICD-10-CM

## 2018-10-31 NOTE — Progress Notes (Signed)
Post-Op Visit Note   Patient: Justin Townsend           Date of Birth: 1961/02/16           MRN: [id number] Visit Date: 10/31/2018 PCP: Sharlene DoryWendling, Nicholas Paul, DO   Assessment & Plan: Post left quad tendon repair 09/19/2018.  He can flex to 80 degrees.  He can do a leg raise with 10 degree extension lag.  He is ready start physical therapy incisions well-healed.  Old knee immobilizer is torn apart with the aluminum coming out and new knee immobilizer given.  Work slip given no work x5 weeks.  Recheck 5 weeks.  Chief Complaint:  Chief Complaint  Patient presents with  . Left Knee - Pain, Follow-up   Visit Diagnoses:  1. Rupture of left quadriceps tendon, subsequent encounter     Plan: PT referral no work x5 weeks office follow-up 5 weeks.  Follow-Up Instructions: No follow-ups on file.   Orders:  No orders of the defined types were placed in this encounter.  No orders of the defined types were placed in this encounter.   Imaging: No results found.  PMFS History: Patient Active Problem List   Diagnosis Date Noted  . Quadriceps tendon rupture 09/19/2018  . Rupture of left quadriceps tendon 09/18/2018  . Generalized anxiety disorder 08/17/2016  . Non-compliant behavior 08/12/2015  . SNORING, HX OF 04/29/2010  . DYSPHAGIA UNSPECIFIED 01/13/2009  . HYPERLIPIDEMIA 12/25/2007  . Essential hypertension 12/25/2007  . NONSPEC ELEVATION OF LEVELS OF TRANSAMINASE/LDH 12/25/2007   Past Medical History:  Diagnosis Date  . Anxiety   . Asthma    only as child; resolved by age 89  . Fatty liver   . Gallstones   . GERD (gastroesophageal reflux disease)   . History of hiatal hernia   . Hyperlipidemia   . Hypertension   . Pneumonia     Family History  Problem Relation Age of Onset  . Ulcers Mother   . Cirrhosis Mother        ? Alcohol Related   . Cancer Paternal Grandfather        CNS Cancer  . Panic disorder Other        Maternal FH  . Anxiety disorder Other    Maternal FH  . Heart attack Maternal Grandfather        >55  . Diabetes Neg Hx   . Stroke Neg Hx   . Colon cancer Neg Hx   . Colon polyps Neg Hx     Past Surgical History:  Procedure Laterality Date  . no colonoscopy     SOC reviewed  . ORIF TOE FRACTURE Right    foot  . QUADRICEPS TENDON REPAIR Left 09/19/2018   Procedure: REPAIR LEFT KNEE QUADRICEPS TENDON;  Surgeon: Eldred MangesYates, Murlin Schrieber C, MD;  Location: [facility] OR;  Service: Orthopedics;  Laterality: Left;  . Thumb Surgery Left    pinning post dislocation   Social History   Occupational History  . Occupation: Air traffic controllerdriver    Employer: [employer]  Tobacco Use  . Smoking status: Light Tobacco Smoker    Types: Cigars  . Smokeless tobacco: Never Used  . Tobacco comment: rare cigar  Substance and Sexual Activity  . Alcohol use: Yes    Alcohol/week: 10.0 standard drinks    Types: 10 Cans of beer per week    Comment: Socially on weekend  . Drug use: No  . Sexual activity: Yes    Partners: Female

## 2018-10-31 NOTE — Addendum Note (Signed)
Addended by: Albertina ParrGARCIA, Sukhman Martine on: 10/31/2018 09:17 AM   Modules accepted: Orders

## 2018-11-01 ENCOUNTER — Other Ambulatory Visit: Payer: Self-pay

## 2018-11-01 ENCOUNTER — Ambulatory Visit: Payer: PRIVATE HEALTH INSURANCE | Attending: Orthopaedic Surgery | Admitting: Physical Therapy

## 2018-11-01 DIAGNOSIS — R262 Difficulty in walking, not elsewhere classified: Secondary | ICD-10-CM

## 2018-11-01 DIAGNOSIS — M6281 Muscle weakness (generalized): Secondary | ICD-10-CM | POA: Diagnosis present

## 2018-11-01 DIAGNOSIS — R2689 Other abnormalities of gait and mobility: Secondary | ICD-10-CM | POA: Diagnosis present

## 2018-11-01 DIAGNOSIS — M25662 Stiffness of left knee, not elsewhere classified: Secondary | ICD-10-CM | POA: Diagnosis present

## 2018-11-01 NOTE — Therapy (Signed)
[facility] [address]  [address] [place], [place], [address] Phone: [phone]   Fax:  [phone]  Physical Therapy Evaluation  Patient Details  Name: Justin Townsend MRN: [id number] Date of Birth: [birth year] Referring Provider (PT): Annell Greening, MD   Encounter Date: 11/01/2018  PT End of Session - 11/01/18 0808    Visit Number  1    Number of Visits  16    Date for PT Re-Evaluation  12/27/18    PT Start Time  0808    PT Stop Time  0907    PT Time Calculation (min)  59 min    Activity Tolerance  Patient tolerated treatment well    Behavior During Therapy  [facility] for tasks assessed/performed       Past Medical History:  Diagnosis Date  . Anxiety   . Asthma    only as child; resolved by age 40  . Fatty liver   . Gallstones   . GERD (gastroesophageal reflux disease)   . History of hiatal hernia   . Hyperlipidemia   . Hypertension   . Pneumonia     Past Surgical History:  Procedure Laterality Date  . no colonoscopy     SOC reviewed  . ORIF TOE FRACTURE Right    foot  . QUADRICEPS TENDON REPAIR Left 09/19/2018   Procedure: REPAIR LEFT KNEE QUADRICEPS TENDON;  Surgeon: Eldred Manges, MD;  Location: [facility] OR;  Service: Orthopedics;  Laterality: Left;  . Thumb Surgery Left    pinning post dislocation    There were no vitals filed for this visit.   Subjective Assessment - 11/01/18 0811    Subjective  Pt reports iniitial injury occurred as a result of a split style fall where the L leg went behind him and the R leg out to the front - does not remember triggering cause of fall. Has been mostly pain free since surgery. Just cleared by MD to start bending the knee as of yesterday. Currently in knee immobilizer and unsure of if/when he will be getting hinged brace. Pt reports MD gave him some home exercises but cannot fully recall them at this time. Did note that he was given a NMES estim unit for quad strengthening and has  use this some.    Pertinent History  L quad tendon repair 09/19/18    Patient Stated Goals  "To get back to work and walk normally."    Currently in Pain?  No/denies         [facility] PT Assessment - 11/01/18 6962      Assessment   Medical Diagnosis  L quad tendon repair    Referring Provider (PT)  Annell Greening, MD    Onset Date/Surgical Date  09/19/18    Next MD Visit  12/10/18    Prior Therapy  none for current condition      Restrictions   Weight Bearing Restrictions  Yes    LLE Weight Bearing  Weight bearing as tolerated      Balance Screen   Has the patient fallen in the past 6 months  Yes    How many times?  2   at time of injury & 1x since surgery   Has the patient had a decrease in activity level because of a fear of falling?   No    Is the patient reluctant to leave their home because of a fear of falling?   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, ~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, needs to work Merchandiser, retail    Leisure  mostly sendentary      Observation/Other Assessments   Focus on Therapeutic Outcomes (FOTO)   46% (54% limitation); Predicted 62% (38% limitation)      ROM / Strength   AROM / PROM / Strength  AROM;PROM;Strength      AROM   AROM Assessment Site  Knee    Right/Left Knee  Right;Left    Right Knee Extension  0    Right Knee Flexion  130    Left Knee Extension  2    Left Knee Flexion  71      PROM   PROM Assessment  Site  Knee    Right/Left Knee  Left    Left Knee Extension  0    Left Knee Flexion  70      Strength   Strength Assessment Site  Hip;Knee    Right/Left Hip  Right;Left    Right Hip Flexion  5/5    Right Hip Extension  5/5    Right Hip ABduction  5/5    Right Hip ADduction  5/5    Left Hip Flexion  4-/5    Left Hip Extension  4-/5    Left Hip ABduction  4/5    Left Hip ADduction  3+/5    Right/Left Knee  Right    Right Knee Flexion  5/5    Right Knee Extension  5/5      Flexibility   Soft Tissue Assessment /Muscle Length  yes    Hamstrings  very mild tightness on L    ITB  mod tight      Ambulation/Gait   Ambulation/Gait Assistance  6: Modified independent (Device/Increase time)   increased time   Ambulation Distance (Feet)  60 Feet    Assistive device  None   L knee immobilizer   Gait Pattern  Step-through pattern;Decreased hip/knee flexion - left;Decreased weight shift to left;Decreased stance time - left;Lateral trunk lean to right    Ambulation Surface  Level;Indoor                Objective measurements completed on examination: See above findings.      [facility] Adult PT Treatment/Exercise - 11/01/18 0808      Exercises   Exercises  Knee/Hip      Knee/Hip Exercises: Stretches   Passive Hamstring Stretch  Left;30 seconds;1 rep    Gastroc Stretch  Left;30 seconds;1 rep    Gastroc Stretch Limitations  long-sitting with strap    Soleus Stretch  Left;30 seconds;1 rep    Soleus Stretch Limitations  long-sitting with strap      Knee/Hip Exercises: Standing   Hip Flexion  Left;15 reps;Knee straight    Hip Abduction  Left;15 reps;Knee straight    Hip Extension  Left;15 reps;Knee straight      Knee/Hip Exercises: Supine   Quad Sets  Left;10 reps   5" hold            PT Education - 11/01/18 0905    Education Details  PT eval findings, anticipated POC & initial HEP    Person(s) Educated  Patient    Methods  Explanation;Demonstration;Handout     Comprehension  Verbalized understanding;Returned demonstration;Need further instruction       PT Short Term Goals - 11/01/18 0908      PT SHORT TERM GOAL #1   Title  Independent with initial HEP    Status  New    Target Date  11/22/18        PT Long Term Goals - 11/01/18 0908      PT LONG TERM GOAL #1   Title  Independent with advance/ongoing HEP    Status  New    Target Date  12/27/18      PT LONG TERM GOAL #2   Title  L knee AROM >/= 0-125 to allow for normal gait and mobility    Status  New      PT LONG TERM GOAL #3   Title  L hip and knee strength >/= 4+/5 for improved stability    Status  New    Target Date  12/27/18      PT LONG TERM GOAL #4   Title  Pt will ambulate with normal gait pattern w/o evidence of L quad instability    Status  New    Target Date  12/27/18      PT LONG TERM GOAL #5   Title  Pt will negotiate stairs with good reciprocal pattern to allow improved access to all levels of home    Status  New    Target Date  12/27/18      PT LONG TERM GOAL #6   Title  Pt will report ability to climb in/out of tractor trailer and work truck Merchandiser, retail with L LE to allow for return to work     Status  New    Target Date  12/27/18             Plan - 11/01/18 0921    Clinical Impression Statement  Justin Townsend is a 57 y/o male who presents to OP PT 6 weeks s/p L quad tendon repair on 09/19/18.  Pt remains in knee immobilizer, but states MD has progressed him to [facility] on L and allowed him to wean from crutches as of yesterday's post-op visit. Pt denies L knee pain since immediate post-op period. L knee AROM/PROM flexion limited due to extended immobilization with mild/moderate decreased proximal and distal LE flexibility noted. Mild to moderate L hip and knee weakness as well as L quad atrophy also noted. Pt will benefit from skilled PT intervention to address the above listed deficits to allow for restoration of normal functional mobility and gait to allow patient to  return to work.    Clinical Presentation  Stable    Clinical Decision Making  Low    Rehab Potential  Good    PT Frequency  2x / week    PT Duration  8 weeks    PT Treatment/Interventions  Patient/family education;Therapeutic exercise;Therapeutic activities;Neuromuscular re-education;Balance training;ADLs/Self Care Home Management;Manual techniques;Scar mobilization;Dry needling;Taping;Electrical Stimulation;Cryotherapy;Vasopneumatic Device    PT Next Visit Plan  monitor BP as pt reporting recently elevated DBP; review initial  PT & MD provided HEPs; L knee ROM & LE strengthening per protocol    Consulted and Agree with Plan of Care  Patient       Patient will benefit from skilled therapeutic intervention in order to improve the following deficits and impairments:  Decreased range of motion, Impaired flexibility, Decreased strength, Difficulty walking, Abnormal gait, Decreased activity tolerance, Decreased balance, Decreased scar mobility  Visit Diagnosis: Stiffness of left knee, not elsewhere classified  Muscle weakness (generalized)  Difficulty in walking, not elsewhere classified  Other abnormalities of gait and mobility     Problem List Patient Active Problem List   Diagnosis Date Noted  . Quadriceps tendon rupture 09/19/2018  . Rupture of left quadriceps tendon 09/18/2018  . Generalized anxiety disorder 08/17/2016  . Non-compliant behavior 08/12/2015  . SNORING, HX OF 04/29/2010  . DYSPHAGIA UNSPECIFIED 01/13/2009  . HYPERLIPIDEMIA 12/25/2007  . Essential hypertension 12/25/2007  . NONSPEC ELEVATION OF LEVELS OF TRANSAMINASE/LDH 12/25/2007    Marry GuanJoAnne M Daina Cara, PT, MPT 11/01/2018, 12:36 PM  [facility] [place] [address]  [address] [place], [place], [address] Phone: [phone]   Fax:  [phone]  Name: Justin Townsend MRN: [id number] Date of Birth: 09/18/1961

## 2018-11-02 ENCOUNTER — Other Ambulatory Visit: Payer: Self-pay | Admitting: Family Medicine

## 2018-11-02 ENCOUNTER — Encounter: Payer: Self-pay | Admitting: Family Medicine

## 2018-11-02 NOTE — Telephone Encounter (Signed)
Copied from CRM [id number]. Topic: Quick Communication - See Telephone Encounter >> Nov 02, 2018  8:37 AM Jolayne Hainesaylor, Brittany L wrote: CRM for notification. See Telephone encounter for: 11/02/18.  Patient's wife, Tamela OddiBetsy called and said that if he needs to come in that is fine and can be there on short notice. Please call his cell phone before the weekend to let him know what to do. Thanks

## 2018-11-05 ENCOUNTER — Ambulatory Visit: Payer: PRIVATE HEALTH INSURANCE

## 2018-11-05 VITALS — BP 138/98 | HR 69

## 2018-11-05 DIAGNOSIS — M25662 Stiffness of left knee, not elsewhere classified: Secondary | ICD-10-CM

## 2018-11-05 DIAGNOSIS — R262 Difficulty in walking, not elsewhere classified: Secondary | ICD-10-CM

## 2018-11-05 DIAGNOSIS — R2689 Other abnormalities of gait and mobility: Secondary | ICD-10-CM

## 2018-11-05 DIAGNOSIS — M6281 Muscle weakness (generalized): Secondary | ICD-10-CM

## 2018-11-05 NOTE — Therapy (Signed)
[facility] [address]  [address] [place], [place], [address] Phone: [phone]   Fax:  [phone]  Physical Therapy Treatment  Patient Details  Name: Justin Townsend MRN: [id number] Date of Birth: Mar 13, 1961 Referring Provider (PT): Annell Greening, MD   Encounter Date: 11/05/2018  PT End of Session - 11/05/18 0850    Visit Number  2    Number of Visits  16    Date for PT Re-Evaluation  12/27/18    PT Start Time  0845    PT Stop Time  0940    PT Time Calculation (min)  55 min    Activity Tolerance  Patient tolerated treatment well    Behavior During Therapy  [facility] for tasks assessed/performed       Past Medical History:  Diagnosis Date  . Anxiety   . Asthma    only as child; resolved by age 62  . Fatty liver   . Gallstones   . GERD (gastroesophageal reflux disease)   . History of hiatal hernia   . Hyperlipidemia   . Hypertension   . Pneumonia     Past Surgical History:  Procedure Laterality Date  . no colonoscopy     SOC reviewed  . ORIF TOE FRACTURE Right    foot  . QUADRICEPS TENDON REPAIR Left 09/19/2018   Procedure: REPAIR LEFT KNEE QUADRICEPS TENDON;  Surgeon: Eldred Manges, MD;  Location: [facility] OR;  Service: Orthopedics;  Laterality: Left;  . Thumb Surgery Left    pinning post dislocation    Vitals:   11/05/18 0901 11/05/18 0904  BP: (!) 148/98 (!) 138/98  Pulse: 69     Subjective Assessment - 11/05/18 0850    Subjective  Pt. with difficulty recalling MD HEP today however reports he is performing seated heel slide.  Notes he's been performing PT HEP.      Pertinent History  L quad tendon repair 09/19/18    Patient Stated Goals  "To get back to work and walk normally."    Currently in Pain?  No/denies    Multiple Pain Sites  No                       [facility] Adult PT Treatment/Exercise - 11/05/18 0858      Ambulation/Gait   Ambulation/Gait  Yes    Ambulation/Gait Assistance  5:  Supervision    Ambulation Distance (Feet)  50 Feet    Assistive device  None   L knee immobilizer   Gait Pattern  Step-through pattern;Decreased hip/knee flexion - left;Decreased weight shift to left;Decreased stance time - left;Lateral trunk lean to right;Left circumduction    Ambulation Surface  Level;Indoor    Gait Comments  Provided cueing for increased hip/knee flexion to reduce circumduction       Knee/Hip Exercises: Stretches   Passive Hamstring Stretch  Left;30 seconds;1 rep    Passive Hamstring Stretch Limitations  strap    Gastroc Stretch  Left;30 seconds;1 rep    Gastroc Stretch Limitations  long-sitting with strap    Soleus Stretch  Left;30 seconds;1 rep    Soleus Stretch Limitations  long-sitting with strap      Knee/Hip Exercises: Standing   Heel Raises  Both;15 reps    Heel Raises Limitations  at counter     Hip Flexion  Left;15 reps;Knee straight   Cueing required for proper technique   Hip Flexion Limitations  counter  Hip Abduction  Left;15 reps;Knee straight   Cueing required for proper technique   Abduction Limitations  counter     Hip Extension  Left;15 reps;Knee straight   Cueing required for proper technique   Extension Limitations  counter     Other Standing Knee Exercises  Standing R/L wt. shift with LE clearance x 10 reps each way       Knee/Hip Exercises: Seated   Heel Slides  Left;10 reps   3" hold    Heel Slides Limitations  per MD HEP    Cueing required for proper technique      Knee/Hip Exercises: Supine   Quad Sets  Left;15 reps   Cueing required for proper technique and breathing   Quad Sets Limitations  5" hold    Cues required to avoid holding breath   Other Supine Knee/Hip Exercises  B HS curl with heels on peanut p-ball 3" x 15 reps       Modalities   Modalities  Vasopneumatic      Vasopneumatic   Number Minutes Vasopneumatic   10 minutes    Vasopnuematic Location   Knee   L   Vasopneumatic Pressure  Medium    Vasopneumatic  Temperature   coldest temp.                 PT Short Term Goals - 11/05/18 6578      PT SHORT TERM GOAL #1   Title  Independent with initial HEP    Status  On-going        PT Long Term Goals - 11/05/18 0851      PT LONG TERM GOAL #1   Title  Independent with advance/ongoing HEP    Status  On-going      PT LONG TERM GOAL #2   Title  L knee AROM >/= 0-125 to allow for normal gait and mobility    Status  On-going      PT LONG TERM GOAL #3   Title  L hip and knee strength >/= 4+/5 for improved stability    Status  On-going      PT LONG TERM GOAL #4   Title  Pt will ambulate with normal gait pattern w/o evidence of L quad instability    Status  On-going      PT LONG TERM GOAL #5   Title  Pt will negotiate stairs with good reciprocal pattern to allow improved access to all levels of home    Status  On-going      PT LONG TERM GOAL #6   Title  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  Able to demo improved overall technique with PT HEP after instruction.  Pt. with poor recollection today of MD HEP only recalling he received seated heel slide from MD thus reviewed this with pt. today.  Ended visit with ice/compression to L knee due to visible swelling for hopeful improvement in swelling and comfort.  Will monitor HEP compliance and continue to progress per protocol in coming visits.      Rehab Potential  Good    PT Frequency  2x / week    PT Duration  8 weeks    PT Treatment/Interventions  Patient/family education;Therapeutic exercise;Therapeutic activities;Neuromuscular re-education;Balance training;ADLs/Self Care Home Management;Manual techniques;Scar mobilization;Dry  needling;Taping;Electrical Stimulation;Cryotherapy;Vasopneumatic Device    Consulted and Agree with Plan of Care  Patient       Patient will benefit from skilled therapeutic intervention in order to improve the following deficits and impairments:  Decreased range of motion, Impaired flexibility, Decreased strength, Difficulty walking, Abnormal gait, Decreased activity tolerance, Decreased balance, Decreased scar mobility  Visit Diagnosis: Stiffness of left knee, not elsewhere classified  Muscle weakness (generalized)  Difficulty in walking, not elsewhere classified  Other abnormalities of gait and mobility     Problem List Patient Active Problem List   Diagnosis Date Noted  . Quadriceps tendon rupture 09/19/2018  . Rupture of left quadriceps tendon 09/18/2018  . Generalized anxiety disorder 08/17/2016  . Non-compliant behavior 08/12/2015  . SNORING, HX OF 04/29/2010  . DYSPHAGIA UNSPECIFIED 01/13/2009  . HYPERLIPIDEMIA 12/25/2007  . Essential hypertension 12/25/2007  . NONSPEC ELEVATION OF LEVELS OF TRANSAMINASE/LDH 12/25/2007    Kermit BaloMicah Dreama Kuna, PTA 11/05/18 12:36 PM  [facility] [place] [address]  [address] [place], [place], [address] Phone: [phone]   Fax:  [phone]  Name: Justin Townsend MRN: [id number] Date of Birth: November 11, 1961

## 2018-11-07 ENCOUNTER — Ambulatory Visit: Payer: Self-pay

## 2018-11-07 ENCOUNTER — Ambulatory Visit: Payer: PRIVATE HEALTH INSURANCE

## 2018-11-07 ENCOUNTER — Ambulatory Visit (INDEPENDENT_AMBULATORY_CARE_PROVIDER_SITE_OTHER): Payer: PRIVATE HEALTH INSURANCE | Admitting: Medical

## 2018-11-07 ENCOUNTER — Encounter: Payer: Self-pay | Admitting: Medical

## 2018-11-07 VITALS — BP 150/92 | HR 81 | Temp 98.7°F | Resp 16 | Ht 71.0 in | Wt 254.0 lb

## 2018-11-07 VITALS — BP 142/92

## 2018-11-07 DIAGNOSIS — R2689 Other abnormalities of gait and mobility: Secondary | ICD-10-CM

## 2018-11-07 DIAGNOSIS — R262 Difficulty in walking, not elsewhere classified: Secondary | ICD-10-CM

## 2018-11-07 DIAGNOSIS — I10 Essential (primary) hypertension: Secondary | ICD-10-CM

## 2018-11-07 DIAGNOSIS — M25662 Stiffness of left knee, not elsewhere classified: Secondary | ICD-10-CM

## 2018-11-07 DIAGNOSIS — M6281 Muscle weakness (generalized): Secondary | ICD-10-CM

## 2018-11-07 MED ORDER — AMLODIPINE BESYLATE 5 MG PO TABS: ORAL_TABLET | ORAL | 3 refills | Status: DC

## 2018-11-07 NOTE — Patient Instructions (Addendum)
Your blood pressure is moderately high today.  Also you have various readings that indicate blood pressure not adequately controlled.  This might be due to 5 pound weight gain over the last 6 to 8 weeks and some stress you described.  Would recommend continuing lisinopril 20 mg a day.  I sent an amlodipine 5 mg prescription to your pharmacy.  Want you to start taking 1 tablet a day and to check your blood pressure daily.  On follow-up visit here or bring those blood pressure readings in and you can bring your home monitor in  and will compare machine readings with our readings.  Please get CMP today.  Esperanza RichtersEdward Lanae Federer, PA-C  Follow-up in 10 days or as needed.

## 2018-11-07 NOTE — Therapy (Addendum)
[facility] [place] [address]  [address] [place], [place], [address] Phone: [phone]   Fax:  [phone]  Physical Therapy Treatment  Patient Details  Name: Justin Townsend L Schiavi MRN: [id number] Date of Birth: 26-Mar-1961 Referring Provider (PT): Annell GreeningMark Yates, MD   Encounter Date: 11/07/2018  PT End of Session - 11/07/18 0909    Visit Number  3    Number of Visits  16    Date for PT Re-Evaluation  12/27/18    PT Start Time  0855   Pt. arrived late    PT Stop Time  0945    PT Time Calculation (min)  50 min    Activity Tolerance  Patient tolerated treatment well    Behavior During Therapy  [facility] for tasks assessed/performed       Past Medical History:  Diagnosis Date  . Anxiety   . Asthma    only as child; resolved by age 89  . Fatty liver   . Gallstones   . GERD (gastroesophageal reflux disease)   . History of hiatal hernia   . Hyperlipidemia   . Hypertension   . Pneumonia     Past Surgical History:  Procedure Laterality Date  . no colonoscopy     SOC reviewed  . ORIF TOE FRACTURE Right    foot  . QUADRICEPS TENDON REPAIR Left 09/19/2018   Procedure: REPAIR LEFT KNEE QUADRICEPS TENDON;  Surgeon: Eldred MangesYates, Mark C, MD;  Location: [facility] OR;  Service: Orthopedics;  Laterality: Left;  . Thumb Surgery Left    pinning post dislocation    Vitals:   11/07/18 0916  BP: (!) 142/92    Subjective Assessment - 11/07/18 0858    Subjective  Pt. doing well today and reports performing HEP 2x/day.      Pertinent History  L quad tendon repair 09/19/18    Patient Stated Goals  "To get back to work and walk normally."    Currently in Pain?  No/denies    Multiple Pain Sites  No                       [facility] Adult PT Treatment/Exercise - 11/07/18 0920      Knee/Hip Exercises: Stretches   Hip Flexor Stretch  Left;2 reps;60 seconds    Hip Flexor Stretch Limitations  mod thomas stretch position with L LE resting on floor      Knee/Hip Exercises: Standing   Heel Raises  Both;20 reps    Knee Flexion  Left;10 reps    Functional Squat  10 reps;3 seconds   working on even wt. shift; well tolerated    Functional Squat Limitations  "mini" squat B UE support on machine;    Wall Squat  10 reps;3 seconds   well tolerated    Wall Squat Limitations  working on even wt. shift       Knee/Hip Exercises: Seated   Long Arc Quad  Left;10 reps   well tolerated    Long Arc Quad Limitations  some lag     Hamstring Curl  Left;10 reps;Strengthening   well tolerated    Hamstring Limitations  yellow TB      Knee/Hip Exercises: Supine   Heel Slides  Left;10 reps    Heel Slides Limitations  pillowcase under shoe to reduce friction     Straight Leg Raises  Left;10 reps   well tolerated    Straight Leg Raises Limitations  ~ 4"  quad lag       Vasopneumatic   Number Minutes Vasopneumatic   10 minutes    Vasopnuematic Location   Knee   L   Vasopneumatic Pressure  Medium    Vasopneumatic Temperature   coldest temp.        Manual Therapy   Manual Therapy  Soft tissue mobilization    Manual therapy comments  supine     Soft tissue mobilization  STM to L distal quad incision x 2 min while in mod thomas stretch position              PT Education - 11/07/18 1348    Education Details  HEP update    Person(s) Educated  Patient    Methods  Explanation;Demonstration;Verbal cues;Handout    Comprehension  Verbalized understanding;Returned demonstration;Verbal cues required;Need further instruction       PT Short Term Goals - 11/07/18 1039      PT SHORT TERM GOAL #1   Title  Independent with initial HEP    Status  Achieved        PT Long Term Goals - 11/05/18 0851      PT LONG TERM GOAL #1   Title  Independent with advance/ongoing HEP    Status  On-going      PT LONG TERM GOAL #2   Title  L knee AROM >/= 0-125 to allow for normal gait and mobility    Status  On-going      PT LONG TERM GOAL #3   Title  L hip  and knee strength >/= 4+/5 for improved stability    Status  On-going      PT LONG TERM GOAL #4   Title  Pt will ambulate with normal gait pattern w/o evidence of L quad instability    Status  On-going      PT LONG TERM GOAL #5   Title  Pt will negotiate stairs with good reciprocal pattern to allow improved access to all levels of home    Status  On-going      PT LONG TERM GOAL #6   Title  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 arrived late to session thus treatment time limited.  [place] reports 2x/day adherence to HEP.  Able to tolerated progression to "1/2 squats", "mini" wall squats, SLR, hamstring curl (limited ROM), heel raises well today per protocol.  Pt. pain free throughout therex and able to correct technique with wall squat for improved wt. distribution following cueing from therapist.  HEP updated with additional seated and supine level activities + standing heel raise per protocol.  Ended visit with ice/compression to knee to reduce post-exercise swelling.  ROM not formally measured today however with visible improvement.  Will plan to formally measure ROM in coming visit.      Rehab Potential  Good    PT Frequency  2x / week    PT Duration  8 weeks    PT Treatment/Interventions  Patient/family education;Therapeutic exercise;Therapeutic activities;Neuromuscular re-education;Balance training;ADLs/Self Care Home Management;Manual techniques;Scar mobilization;Dry needling;Taping;Electrical Stimulation;Cryotherapy;Vasopneumatic Device    PT Next Visit Plan  L knee ROM & LE strengthening per protocol    Consulted and Agree with Plan of Care  Patient  Patient will benefit from skilled therapeutic intervention in order to improve the following deficits and impairments:  Decreased range of motion, Impaired  flexibility, Decreased strength, Difficulty walking, Abnormal gait, Decreased activity tolerance, Decreased balance, Decreased scar mobility  Visit Diagnosis: Stiffness of left knee, not elsewhere classified  Muscle weakness (generalized)  Difficulty in walking, not elsewhere classified  Other abnormalities of gait and mobility     Problem List Patient Active Problem List   Diagnosis Date Noted  . Quadriceps tendon rupture 09/19/2018  . Rupture of left quadriceps tendon 09/18/2018  . Generalized anxiety disorder 08/17/2016  . Non-compliant behavior 08/12/2015  . SNORING, HX OF 04/29/2010  . DYSPHAGIA UNSPECIFIED 01/13/2009  . HYPERLIPIDEMIA 12/25/2007  . Essential hypertension 12/25/2007  . NONSPEC ELEVATION OF LEVELS OF TRANSAMINASE/LDH 12/25/2007    Kermit Balo, PTA 11/07/18 1:49 PM    [facility] [address]  [address] [place], [place], [address] Phone: [phone]   Fax:  [phone]  Name: JARYAN CHICOINE MRN: [id number] Date of Birth: 12-18-1960

## 2018-11-07 NOTE — Telephone Encounter (Signed)
Wife calling (Justin Townsend with caller) c/o high blood pressure this morning. Justin Townsend's BP has been elevated for over 2 weeks.  BP this morning 143/101 and repeated blood pressure during the triage call: 152/99. Justin Townsend has h/o high blood pressure. Justin Townsend stated that Dr Carmelia RollerWendling ordered Justin Townsend to double up on his blood pressure medication (lisinopril). Since the 10/31/18 his blood pressure has been elevated despite the increase of BP medication. Justin Townsend c/o mild lightheadedness, but stated that he just woke up. Justin Townsend denies headache, chest pain, blurred vision, difficulty breathing or weakness.  Care advice given and wife verbalized understanding. Justin Townsend wanting to be seen today. No appointments available with PCP. Appointment made with Esperanza RichtersEdward Saguier PA at 1:40 pm.   Reason for Disposition . Systolic BP  >= 160 OR Diastolic >= 100  Answer Assessment - Initial Assessment Questions 1. BLOOD PRESSURE: "What is the blood pressure?" "Did you take at least two measurements 5 minutes apart?"     143/101  BP taken during call: 152/99 2. ONSET: "When did you take your blood pressure?"     7:15 am  3. HOW: "How did you obtain the blood pressure?" (e.g., visiting nurse, automatic home BP monitor)     Automatic home BP machine 4. HISTORY: "Do you have a history of high blood pressure?"     yes 5. MEDICATIONS: "Are you taking any medications for blood pressure?" "Have you missed any doses recently?"     Yes-no 6. OTHER SYMPTOMS: "Do you have any symptoms?" (e.g., headache, chest pain, blurred vision, difficulty breathing, weakness)  mild lightheadedness 7. PREGNANCY: "Is there any chance you are pregnant?" "When was your last menstrual period?"     n/a  Protocols used: HIGH BLOOD PRESSURE-A-AH

## 2018-11-07 NOTE — Progress Notes (Signed)
Subjective:    Patient ID: Justin Townsend, male    DOB: [birth year], 57 y.o.   MRN: [id number]  HPI  Pt in for bp check.  Pt states he tore his left quadriceps in the fall. He slipped in bathroom. He states he had to get surgery for this tear. He states sedentary for past 6 weeks approximate. He is not accustomed to being sedentary. His weight is up by about 5 lbs. He also states drinking some beer.   Pt states has been on lisinopril 10 mg up until 10/30/2018. But increased his bp med to 20 mg daily.   Majority of his bp readings have been.  10-31-2018 143/96, 139/98. 11-01-2018 152/104, 140/88 11-02-2018 153/102, 125/84 11-03-2018 137/89, 118/98 11-04-2018 140/95, 139/101 11-05-2018 150/102, 156/94 11-06-2018 137/89. 118/98 143/101  Pt not sure when he can return to more regular acitivity. 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   Review of Systems  Constitutional: Negative for chills, fatigue and fever.  Respiratory: Negative for cough, chest tightness, shortness of breath and wheezing.   Cardiovascular: Negative for chest pain and palpitations.  Gastrointestinal: Negative for abdominal pain.  Musculoskeletal: Negative for back pain.  Skin: Negative for rash.  Neurological: Negative for dizziness, light-headedness and headaches.  Hematological: Negative for adenopathy. Does not bruise/bleed easily.  Psychiatric/Behavioral: Negative for behavioral problems and decreased concentration.     Past Medical History:  Diagnosis Date  . Anxiety   . Asthma    only as child; resolved by age 59  . Fatty liver   . Gallstones   . GERD (gastroesophageal reflux disease)   . History of hiatal hernia   . Hyperlipidemia   . Hypertension   . Pneumonia      Social History   Socioeconomic History  . Marital status: Married    Spouse name: Not on file  . Number of children: 0  . Years of education: Not on file  . Highest education level: Not on file  Occupational  History  . Occupation: Air traffic controller: [employer]  Social Needs  . Financial resource strain: Not on file  . Food insecurity:    Worry: Not on file    Inability: Not on file  . Transportation needs:    Medical: Not on file    Non-medical: Not on file  Tobacco Use  . Smoking status: Light Tobacco Smoker    Types: Cigars  . Smokeless tobacco: Never Used  . Tobacco comment: rare cigar  Substance and Sexual Activity  . Alcohol use: Yes    Alcohol/week: 10.0 standard drinks    Types: 10 Cans of beer per week    Comment: Socially on weekend  . Drug use: No  . Sexual activity: Yes    Partners: Female  Lifestyle  . Physical activity:    Days per week: Not on file    Minutes per session: Not on file  . Stress: Not on file  Relationships  . Social connections:    Talks on phone: Not on file    Gets together: Not on file    Attends religious service: Not on file    Active member of club or organization: Not on file    Attends meetings of clubs or organizations: Not on file    Relationship status: Not on file  . Intimate partner violence:    Fear of current or ex partner: Not on file    Emotionally abused: Not on  file    Physically abused: Not on file    Forced sexual activity: Not on file  Other Topics Concern  . Not on file  Social History Narrative  . Not on file    Past Surgical History:  Procedure Laterality Date  . no colonoscopy     SOC reviewed  . ORIF TOE FRACTURE Right    foot  . QUADRICEPS TENDON REPAIR Left 09/19/2018   Procedure: REPAIR LEFT KNEE QUADRICEPS TENDON;  Surgeon: Eldred MangesYates, Mark C, MD;  Location: [facility] OR;  Service: Orthopedics;  Laterality: Left;  . Thumb Surgery Left    pinning post dislocation    Family History  Problem Relation Age of Onset  . Ulcers Mother   . Cirrhosis Mother        ? Alcohol Related   . Cancer Paternal Grandfather        CNS Cancer  . Panic disorder Other        Maternal FH  . Anxiety disorder  Other        Maternal FH  . Heart attack Maternal Grandfather        >55  . Diabetes Neg Hx   . Stroke Neg Hx   . Colon cancer Neg Hx   . Colon polyps Neg Hx     No Known Allergies  Current Outpatient Medications on File Prior to Visit  Medication Sig Dispense Refill  . ezetimibe (ZETIA) 10 MG tablet TAKE ONE TABLET BY MOUTH ONCE DAILY 90 tablet 3  . FLUoxetine (PROZAC) 10 MG tablet Take 1 tablet (10 mg total) by mouth daily. 90 tablet 3  . ibuprofen (ADVIL,MOTRIN) 200 MG tablet Take 800 mg by mouth 2 (two) times daily as needed for moderate pain.    Marland Kitchen. lisinopril (PRINIVIL,ZESTRIL) 10 MG tablet Take 2 tablets (20 mg total) by mouth daily. 90 tablet 3  . Multiple Vitamins-Minerals (MULTIVITAMIN PO) Take 1 tablet by mouth daily.     . Omega-3 Fatty Acids (FISH OIL ULTRA) 1400 MG CAPS Take 2,800 mg by mouth daily.    Marland Kitchen. omeprazole (PRILOSEC) 40 MG capsule Take 1 capsule (40 mg total) by mouth daily. 30 capsule 11   No current facility-administered medications on file prior to visit.     BP (!) 145/84 (BP Location: Right Arm, Patient Position: Sitting, Cuff Size: Large)   Pulse 81   Temp 98.7 F (37.1 C) (Oral)   Resp 16   Ht 5\' 11"  (1.803 m)   Wt 254 lb (115.2 kg)   SpO2 99%   BMI 35.43 kg/m       Objective:   Physical Exam  General Mental Status- Alert. General Appearance- Not in acute distress.   Skin General: Color- Normal Color. Moisture- Normal Moisture.  Neck Carotid Arteries- Normal color. Moisture- Normal Moisture. No carotid bruits. No JVD.  Chest and Lung Exam Auscultation: Breath Sounds:-Normal.  Cardiovascular Auscultation:Rythm- Regular. Murmurs & Other Heart Sounds:Auscultation of the heart reveals- No Murmurs.  Abdomen Inspection:-Inspeection Normal. Palpation/Percussion:Note:No mass. Palpation and Percussion of the abdomen reveal- Non Tender, Non Distended + BS, no rebound or guarding.    Neurologic Cranial Nerve exam:- CN III-XII intact(No  nystagmus), symmetric smile. Drift Test:- No drift. Romberg Exam:- Negative.  Heal to Toe Gait exam:-Normal. Finger to Nose:- Normal/Intact Strength:- 5/5 equal and symmetric strength both upper and lower extremities.  Left lower ext- no calf swelling, negative homans sign. No pain on palpation of left popliteal fossae.      Assessment & Plan:  Your blood pressure is moderately high today.  Also you have various readings that indicate blood pressure not adequately controlled.  This might be due to 5 pound weight gain over the last 6 to 8 weeks and some stress you described.  Would recommend continuing lisinopril 20 mg a day.  I sent an amlodipine 5 mg prescription to your pharmacy.  Want you to start taking 1 tablet a day and to check your blood pressure daily.  On follow-up visit here or bring those blood pressure readings in and you can bring your home monitor in  and will compare machine readings with our readings.  Please get CMP today.  Follow-up in 10 days or as needed.  Discussed with pt that if he gets any left calf swelilng or pain behind knee let me know and would get [place]. Pt expressed understanding.

## 2018-11-08 LAB — COMPREHENSIVE METABOLIC PANEL
ALT: 66 U/L — ABNORMAL HIGH (ref 0–53)
AST: 43 U/L — ABNORMAL HIGH (ref 0–37)
Albumin: 4.6 g/dL (ref 3.5–5.2)
Alkaline Phosphatase: 68 U/L (ref 39–117)
BUN: 18 mg/dL (ref 6–23)
CO2: 26 mEq/L (ref 19–32)
Calcium: 9.5 mg/dL (ref 8.4–10.5)
Chloride: 106 mEq/L (ref 96–112)
Creatinine, Ser: 0.85 mg/dL (ref 0.40–1.50)
GFR: 98.71 mL/min (ref >60.00)
GLUCOSE: 88 mg/dL (ref 70–99)
POTASSIUM: 4 meq/L (ref 3.5–5.1)
SODIUM: 142 meq/L (ref 135–145)
Total Bilirubin: 0.4 mg/dL (ref 0.2–1.2)
Total Protein: 7.5 g/dL (ref 6.0–8.3)

## 2018-11-12 ENCOUNTER — Ambulatory Visit: Payer: PRIVATE HEALTH INSURANCE | Admitting: Physical Therapy

## 2018-11-12 ENCOUNTER — Encounter: Payer: Self-pay | Admitting: Physical Therapy

## 2018-11-12 DIAGNOSIS — M25662 Stiffness of left knee, not elsewhere classified: Secondary | ICD-10-CM

## 2018-11-12 DIAGNOSIS — R262 Difficulty in walking, not elsewhere classified: Secondary | ICD-10-CM

## 2018-11-12 DIAGNOSIS — M6281 Muscle weakness (generalized): Secondary | ICD-10-CM

## 2018-11-12 DIAGNOSIS — R2689 Other abnormalities of gait and mobility: Secondary | ICD-10-CM

## 2018-11-12 NOTE — Therapy (Signed)
[facility] [address]  [address] [place], [place], [address] Phone: [phone]   Fax:  [phone]  Physical Therapy Treatment  Patient Details  Name: Justin Townsend MRN: [id number] Date of Birth: [birth year] Referring Provider (PT): Justin GreeningMark Yates, MD   Encounter Date: 11/12/2018  PT End of Session - 11/12/18 0842    Visit Number  4    Number of Visits  16    Date for PT Re-Evaluation  12/27/18    Authorization Type  Prior authorization required (Generic Commercial)    Authorization Time Period  11/01/18 - 11/29/18    Authorization - Visit Number  4    Authorization - Number of Visits  13   12 visits + eval   PT Start Time  0842    PT Stop Time  0931    PT Time Calculation (min)  49 min    Activity Tolerance  Patient tolerated treatment well    Behavior During Therapy  [facility] for tasks assessed/performed       Past Medical History:  Diagnosis Date  . Anxiety   . Asthma    only as child; resolved by age 89  . Fatty liver   . Gallstones   . GERD (gastroesophageal reflux disease)   . History of hiatal hernia   . Hyperlipidemia   . Hypertension   . Pneumonia     Past Surgical History:  Procedure Laterality Date  . no colonoscopy     SOC reviewed  . ORIF TOE FRACTURE Right    foot  . QUADRICEPS TENDON REPAIR Left 09/19/2018   Procedure: REPAIR LEFT KNEE QUADRICEPS TENDON;  Surgeon: Justin MangesYates, Justin C, MD;  Location: [facility] OR;  Service: Orthopedics;  Laterality: Left;  . Thumb Surgery Left    pinning post dislocation    There were no vitals filed for this visit.  Subjective Assessment - 11/12/18 0845    Subjective  Pt reporting he feels like is scar is restricting his ability to bend his knee.    Pertinent History  L quad tendon repair 09/19/18    Patient Stated Goals  "To get back to work and walk normally."    Currently in Pain?  No/denies         [facility] - 11/12/18 0842      AROM   Left Knee  Extension  0    Left Knee Flexion  100                   [facility] Adult PT Treatment/Exercise - 11/12/18 0842      Ambulation/Gait   Ambulation/Gait Assistance  5: Supervision;6: Modified independent (Device/Increase time)    Ambulation/Gait Assistance Details  Cues for even weight shift, increased hip/knee flexion for improved foot clearance and heel strike to normalize gati pattern.    Ambulation Distance (Feet)  180 Feet    Assistive device  None    Gait Pattern  Step-through pattern;Decreased weight shift to left;Decreased stance time - left;Decreased hip/knee flexion - left    Ambulation Surface  Level;Indoor      Knee/Hip Exercises: Stretches   Knee: Self-Stretch to increase Flexion  Left;10 seconds;5 reps    Knee: Self-Stretch Limitations  step stretch at end of treadmill      Knee/Hip Exercises: Aerobic   Recumbent Bike  rocking for ROM x 6 min      Knee/Hip Exercises: Standing   Heel Raises  Both;20 reps  Heel Raises Limitations  cues for quad & glute set with heel raise    Knee Flexion  Left;15 reps;AROM    Rebounder  Lateral, heel-toe & B staggered stance weight shifts x 20 each; marching in place x 20 on mini-tramp; B UE support      Knee/Hip Exercises: Seated   Long Arc Quad  Left;15 reps    Other Seated Knee/Hip Exercises  L submax multi-angle PT resisted knee extension isometrics x10      Knee/Hip Exercises: Supine   Short Arc Quad Sets  Left;15 reps;AROM    Short Arc Quad Sets Limitations  8" foam roll    Straight Leg Raises  Left;20 reps;AROM    Straight Leg Raises Limitations  cues for quad set prior to initiation of lift    Patellar Mobs  L knee all directions      Knee/Hip Exercises: Sidelying   Hip ABduction  Left;15 reps;AROM    Hip ADduction  Left;15 reps;AROM      Manual Therapy   Manual Therapy  Soft tissue mobilization;Other (comment)    Manual therapy comments  supine     Soft tissue mobilization  Incisional scar tissue massage to L  knee incision    Other Manual Therapy  Provided instruction in L knee scar mobilization               PT Short Term Goals - 11/07/18 1039      PT SHORT TERM GOAL #1   Title  Independent with initial HEP    Status  Achieved        PT Long Term Goals - 11/05/18 0851      PT LONG TERM GOAL #1   Title  Independent with advance/ongoing HEP    Status  On-going      PT LONG TERM GOAL #2   Title  L knee AROM >/= 0-125 to allow for normal gait and mobility    Status  On-going      PT LONG TERM GOAL #3   Title  L hip and knee strength >/= 4+/5 for improved stability    Status  On-going      PT LONG TERM GOAL #4   Title  Pt will ambulate with normal gait pattern w/o evidence of L quad instability    Status  On-going      PT LONG TERM GOAL #5   Title  Pt will negotiate stairs with good reciprocal pattern to allow improved access to all levels of home    Status  On-going      PT LONG TERM GOAL #6   Title  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 Pt feeling like incisional scar restricting ROM, therefore instructed in self-scar mobilization for home. Gait assessed w/o knee immobilizer with no evidence of quad instability therefore pt given clearance to walk around home w/o brace, but instructed to continue to use knee immobilizer when walking long distances or outdoors on uneven surfaces. Pt declining ice/vasopneumatic today and wil ice at home as needed for edema.    Rehab Potential  Good  PT Frequency  2x / week    PT Duration  8 weeks    PT Treatment/Interventions  Patient/family education;Therapeutic exercise;Therapeutic activities;Neuromuscular re-education;Balance  training;ADLs/Self Care Home Management;Manual techniques;Scar mobilization;Dry needling;Taping;Electrical Stimulation;Cryotherapy;Vasopneumatic Device    PT Next Visit Plan  L knee ROM & LE strengthening per protocol    Consulted and Agree with Plan of Care  Patient       Patient will benefit from skilled therapeutic intervention in order to improve the following deficits and impairments:  Decreased range of motion, Impaired flexibility, Decreased strength, Difficulty walking, Abnormal gait, Decreased activity tolerance, Decreased balance, Decreased scar mobility  Visit Diagnosis: Stiffness of left knee, not elsewhere classified  Muscle weakness (generalized)  Difficulty in walking, not elsewhere classified  Other abnormalities of gait and mobility     Problem List Patient Active Problem List   Diagnosis Date Noted  . Quadriceps tendon rupture 09/19/2018  . Rupture of left quadriceps tendon 09/18/2018  . Generalized anxiety disorder 08/17/2016  . Non-compliant behavior 08/12/2015  . SNORING, HX OF 04/29/2010  . DYSPHAGIA UNSPECIFIED 01/13/2009  . HYPERLIPIDEMIA 12/25/2007  . Essential hypertension 12/25/2007  . NONSPEC ELEVATION OF LEVELS OF TRANSAMINASE/LDH 12/25/2007    Marry GuanJoAnne M Kalilah Barua, PT, MPT 11/12/2018, 9:43 AM  [facility] [place] [address]  [address] [place], [place], [address] Phone: [phone]   Fax:  [phone]  Name: Justin Townsend MRN: [id number] Date of Birth: [birth year]

## 2018-11-13 ENCOUNTER — Encounter: Payer: Self-pay | Admitting: Physical Therapy

## 2018-11-13 ENCOUNTER — Ambulatory Visit: Payer: PRIVATE HEALTH INSURANCE | Admitting: Physical Therapy

## 2018-11-13 DIAGNOSIS — M25662 Stiffness of left knee, not elsewhere classified: Secondary | ICD-10-CM | POA: Diagnosis not present

## 2018-11-13 DIAGNOSIS — M6281 Muscle weakness (generalized): Secondary | ICD-10-CM

## 2018-11-13 DIAGNOSIS — R262 Difficulty in walking, not elsewhere classified: Secondary | ICD-10-CM

## 2018-11-13 DIAGNOSIS — R2689 Other abnormalities of gait and mobility: Secondary | ICD-10-CM

## 2018-11-13 NOTE — Therapy (Signed)
[facility] [address]  [address] [place], [place], [address] Phone: [phone]   Fax:  [phone]  Physical Therapy Treatment  Patient Details  Name: Justin Townsend MRN: [id number] Date of Birth: 06-09-1961 Referring Provider (PT): Justin Greening, MD   Encounter Date: 11/13/2018  PT End of Session - 11/13/18 0849    Visit Number  5    Number of Visits  16    Date for PT Re-Evaluation  12/27/18    Authorization Type  Prior authorization required (Generic Commercial)    Authorization Time Period  11/01/18 - 11/29/18    Authorization - Visit Number  5    Authorization - Number of Visits  13   12 visits + eval   PT Start Time  0849    PT Stop Time  0946    PT Time Calculation (min)  57 min    Activity Tolerance  Patient tolerated treatment well    Behavior During Therapy  [facility] for tasks assessed/performed;Impulsive   very distracted      Past Medical History:  Diagnosis Date  . Anxiety   . Asthma    only as child; resolved by age 27  . Fatty liver   . Gallstones   . GERD (gastroesophageal reflux disease)   . History of hiatal hernia   . Hyperlipidemia   . Hypertension   . Pneumonia     Past Surgical History:  Procedure Laterality Date  . no colonoscopy     SOC reviewed  . ORIF TOE FRACTURE Right    foot  . QUADRICEPS TENDON REPAIR Left 09/19/2018   Procedure: REPAIR LEFT KNEE QUADRICEPS TENDON;  Surgeon: Justin Manges, MD;  Location: [facility] OR;  Service: Orthopedics;  Laterality: Left;  . Thumb Surgery Left    pinning post dislocation    There were no vitals filed for this visit.  Subjective Assessment - 11/13/18 0849    Subjective  Pt reporting no pain in knee today, but notes a "catch" in his R low back. Tried walking around at home w/o the brace yesterday and feels like knee is a little more limber today.    Pertinent History  L quad tendon repair 09/19/18    Patient Stated Goals  "To get back to work and  walk normally."    Currently in Pain?  No/denies                       [facility] Adult PT Treatment/Exercise - 11/13/18 0849      Exercises   Exercises  Knee/Hip      Knee/Hip Exercises: Aerobic   Recumbent Bike  rocking for ROM x 6 min      Knee/Hip Exercises: Standing   Forward Lunges  Right;Left;10 reps;3 seconds    Forward Lunges Limitations  max cues for proper technique    Side Lunges  Left;Right;5 reps;3 seconds    Side Lunges Limitations  attempted but pt unable to coordinate movement despite max verbal & tactile cues    Terminal Knee Extension  Left;15 reps;Strengthening    Terminal Knee Extension Limitations  5" hold against small ball into wall    Functional Squat  15 reps;5 seconds    Functional Squat Limitations  TRX 1/2 squats    SLS  L SLS 3 x 15 sec; intermittent single UE support    SLS with Vectors  L SLS with 3 way clock x10; single UE support  on counter      Knee/Hip Exercises: Seated   Other Seated Knee/Hip Exercises  L Fitter leg press (1 black) 2 x 10    Hamstring Curl  Left;15 reps;Strengthening    Hamstring Limitations  red TB - cues for slow controlled motion      Knee/Hip Exercises: Supine   Bridges  Both;15 reps;Strengthening    Bridges Limitations  3-5" hold; cues to avoid holding breath    Straight Leg Raises  Left;20 reps;Strengthening    Straight Leg Raises Limitations  2# - cues for quad set prior to initiation of lift    Knee Flexion  Left;AAROM;20 reps    Knee Flexion Limitations  B HS curl with heels on peanut ball - 3" hold      Modalities   Modalities  Vasopneumatic      Vasopneumatic   Number Minutes Vasopneumatic   10 minutes    Vasopnuematic Location   Knee   L   Vasopneumatic Pressure  Medium    Vasopneumatic Temperature   coldest temp.                 PT Short Term Goals - 11/07/18 1039      PT SHORT TERM GOAL #1   Title  Independent with initial HEP    Status  Achieved        PT Long Term Goals -  11/05/18 0851      PT LONG TERM GOAL #1   Title  Independent with advance/ongoing HEP    Status  On-going      PT LONG TERM GOAL #2   Title  L knee AROM >/= 0-125 to allow for normal gait and mobility    Status  On-going      PT LONG TERM GOAL #3   Title  L hip and knee strength >/= 4+/5 for improved stability    Status  On-going      PT LONG TERM GOAL #4   Title  Pt will ambulate with normal gait pattern w/o evidence of L quad instability    Status  On-going      PT LONG TERM GOAL #5   Title  Pt will negotiate stairs with good reciprocal pattern to allow improved access to all levels of home    Status  On-going      PT LONG TERM GOAL #6   Title  Pt 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     Rehab Potential  Good    PT Frequency  2x / week    PT Duration  8 weeks    PT Treatment/Interventions  Patient/family education;Therapeutic exercise;Therapeutic activities;Neuromuscular re-education;Balance training;ADLs/Self Care Home Management;Manual techniques;Scar mobilization;Dry needling;Taping;Electrical Stimulation;Cryotherapy;Vasopneumatic Device    PT Next Visit Plan  L knee ROM & LE strengthening per protocol    Consulted and Agree with Plan of Care  Patient  Patient will benefit from skilled therapeutic intervention in order to improve the following deficits and impairments:  Decreased range of motion, Impaired  flexibility, Decreased strength, Difficulty walking, Abnormal gait, Decreased activity tolerance, Decreased balance, Decreased scar mobility  Visit Diagnosis: Stiffness of left knee, not elsewhere classified  Muscle weakness (generalized)  Difficulty in walking, not elsewhere classified  Other abnormalities of gait and mobility     Problem List Patient Active Problem List   Diagnosis Date Noted  . Quadriceps tendon rupture 09/19/2018  . Rupture of left quadriceps tendon 09/18/2018  . Generalized anxiety disorder 08/17/2016  . Non-compliant behavior 08/12/2015  . SNORING, HX OF 04/29/2010  . DYSPHAGIA UNSPECIFIED 01/13/2009  . HYPERLIPIDEMIA 12/25/2007  . Essential hypertension 12/25/2007  . NONSPEC ELEVATION OF LEVELS OF TRANSAMINASE/LDH 12/25/2007    Marry GuanJoAnne M Tommie Bohlken, PT, MPT 11/13/2018, 9:43 AM  [facility] [place] [address]  [address] [place], [place], [address] Phone: [phone]   Fax:  [phone]  Name: Yolanda Bonineerry L Camper MRN: [id number] Date of Birth: 06-29-1961

## 2018-11-19 ENCOUNTER — Ambulatory Visit (INDEPENDENT_AMBULATORY_CARE_PROVIDER_SITE_OTHER): Payer: PRIVATE HEALTH INSURANCE | Admitting: Medical

## 2018-11-19 ENCOUNTER — Encounter: Payer: Self-pay | Admitting: Medical

## 2018-11-19 VITALS — BP 131/88 | HR 68 | Temp 98.5°F | Resp 16 | Ht 71.0 in | Wt 256.6 lb

## 2018-11-19 DIAGNOSIS — I10 Essential (primary) hypertension: Secondary | ICD-10-CM | POA: Diagnosis not present

## 2018-11-19 NOTE — Progress Notes (Signed)
Subjective:    Patient ID: Justin Townsend, male    DOB: 23-Mar-1961, 57 y.o.   MRN: [id number]  HPI  Pt in for blood pressure check.   Pt drank one cup of coffee today.  Pt cmp showed mild elevated liver enzymes. He drinks little more alcohol with Holidays.  On last visit I advised to continue lisinopril. I made amlodipine available to start. Which he did.  Pt has been checking bp. His at home readings are in 130/80 range. Though initially readings are little higher(particular the bottom number)    Initial bp here with I checked was 120/80 and 126/85. Pt bp was 131/82. Second 134/89.     Review of Systems  Constitutional: Negative for chills, fatigue and fever.  Respiratory: Negative for chest tightness, shortness of breath and wheezing.   Cardiovascular: Negative for chest pain and palpitations.  Gastrointestinal: Negative for abdominal distention, abdominal pain and blood in stool.  Musculoskeletal: Negative for back pain.  Skin: Negative for rash.  Neurological: Negative for dizziness, seizures, syncope, weakness, numbness and headaches.  Hematological: Negative for adenopathy. Does not bruise/bleed easily.  Psychiatric/Behavioral: Negative for behavioral problems, confusion, hallucinations and suicidal ideas. The patient is not nervous/anxious.     Past Medical History:  Diagnosis Date  . Anxiety   . Asthma    only as child; resolved by age 89  . Fatty liver   . Gallstones   . GERD (gastroesophageal reflux disease)   . History of hiatal hernia   . Hyperlipidemia   . Hypertension   . Pneumonia      Social History   Socioeconomic History  . Marital status: Married    Spouse name: Not on file  . Number of children: 0  . Years of education: Not on file  . Highest education level: Not on file  Occupational History  . Occupation: Air traffic controllerdriver    Employer: Scientist, research (life sciences)[employer]  Social Needs  . Financial resource strain: Not on file  . Food insecurity:   Worry: Not on file    Inability: Not on file  . Transportation needs:    Medical: Not on file    Non-medical: Not on file  Tobacco Use  . Smoking status: Light Tobacco Smoker    Types: Cigars  . Smokeless tobacco: Never Used  . Tobacco comment: rare cigar  Substance and Sexual Activity  . Alcohol use: Yes    Alcohol/week: 10.0 standard drinks    Types: 10 Cans of beer per week    Comment: Socially on weekend  . Drug use: No  . Sexual activity: Yes    Partners: Female  Lifestyle  . Physical activity:    Days per week: Not on file    Minutes per session: Not on file  . Stress: Not on file  Relationships  . Social connections:    Talks on phone: Not on file    Gets together: Not on file    Attends religious service: Not on file    Active member of club or organization: Not on file    Attends meetings of clubs or organizations: Not on file    Relationship status: Not on file  . Intimate partner violence:    Fear of current or ex partner: Not on file    Emotionally abused: Not on file    Physically abused: Not on file    Forced sexual activity: Not on file  Other Topics Concern  . Not on file  Social History  Narrative  . Not on file    Past Surgical History:  Procedure Laterality Date  . no colonoscopy     SOC reviewed  . ORIF TOE FRACTURE Right    foot  . QUADRICEPS TENDON REPAIR Left 09/19/2018   Procedure: REPAIR LEFT KNEE QUADRICEPS TENDON;  Surgeon: Eldred MangesYates, Mark C, MD;  Location: [facility] OR;  Service: Orthopedics;  Laterality: Left;  . Thumb Surgery Left    pinning post dislocation    Family History  Problem Relation Age of Onset  . Ulcers Mother   . Cirrhosis Mother        ? Alcohol Related   . Cancer Paternal Grandfather        CNS Cancer  . Panic disorder Other        Maternal FH  . Anxiety disorder Other        Maternal FH  . Heart attack Maternal Grandfather        >55  . Diabetes Neg Hx   . Stroke Neg Hx   . Colon cancer Neg Hx   . Colon polyps  Neg Hx     No Known Allergies  Current Outpatient Medications on File Prior to Visit  Medication Sig Dispense Refill  . amLODipine (NORVASC) 5 MG tablet 1-2 tab po q day 60 tablet 3  . ezetimibe (ZETIA) 10 MG tablet TAKE ONE TABLET BY MOUTH ONCE DAILY 90 tablet 3  . FLUoxetine (PROZAC) 10 MG tablet Take 1 tablet (10 mg total) by mouth daily. 90 tablet 3  . ibuprofen (ADVIL,MOTRIN) 200 MG tablet Take 800 mg by mouth 2 (two) times daily as needed for moderate pain.    Marland Kitchen. lisinopril (PRINIVIL,ZESTRIL) 10 MG tablet Take 2 tablets (20 mg total) by mouth daily. 90 tablet 3  . Multiple Vitamins-Minerals (MULTIVITAMIN PO) Take 1 tablet by mouth daily.     . Omega-3 Fatty Acids (FISH OIL ULTRA) 1400 MG CAPS Take 2,800 mg by mouth daily.    Marland Kitchen. omeprazole (PRILOSEC) 40 MG capsule Take 1 capsule (40 mg total) by mouth daily. 30 capsule 11   No current facility-administered medications on file prior to visit.     There were no vitals taken for this visit.      Objective:   Physical Exam  General Mental Status- Alert. General Appearance- Not in acute distress.   Skin General: Color- Normal Color. Moisture- Normal Moisture.  Neck Carotid Arteries- Normal color. Moisture- Normal Moisture. No carotid bruits. No JVD.  Chest and Lung Exam Auscultation: Breath Sounds:-Normal.  Cardiovascular Auscultation:Rythm- Regular. Murmurs & Other Heart Sounds:Auscultation of the heart reveals- No Murmurs.  Abdomen Inspection:-Inspeection Normal. Palpation/Percussion:Note:No mass. Palpation and Percussion of the abdomen reveal- Non Tender, Non Distended + BS, no rebound or guarding.   Neurologic Cranial Nerve exam:- CN III-XII intact(No nystagmus), symmetric smile. Strength:- 5/5 equal and symmetric strength both upper and lower extremities.      Assessment & Plan:  Your bp area better controlled today. Your machine seems to read just little higher than manual readings.Continue lisinopril and  amlodipine.  Continue to eat low salt diet and drink less beer. Try to reduce overall calorie consumption while not able to exercise during left knee recovery period.  Follow up 1-2 months with pcp or as needed  25 minutes spent with pt today. Discussing bp med treatment and conservative measures to keep bp under control.   Esperanza RichtersEdward Latosha Gaylord, PA-C

## 2018-11-19 NOTE — Patient Instructions (Addendum)
Your bp area better controlled today. Your machine seems to read just little higher than manual readings. Continue lisinopril and amlodipine.  Continue to eat low salt diet and drink less beer. Try to reduce overall calorie consumption while not able to exercise during left knee recovery period.  Follow up 1-2 months with pcp or as needed

## 2018-11-19 NOTE — Progress Notes (Signed)
hyp 

## 2018-11-20 ENCOUNTER — Ambulatory Visit: Payer: PRIVATE HEALTH INSURANCE

## 2018-11-20 DIAGNOSIS — M6281 Muscle weakness (generalized): Secondary | ICD-10-CM

## 2018-11-20 DIAGNOSIS — M25662 Stiffness of left knee, not elsewhere classified: Secondary | ICD-10-CM

## 2018-11-20 DIAGNOSIS — R262 Difficulty in walking, not elsewhere classified: Secondary | ICD-10-CM

## 2018-11-20 DIAGNOSIS — R2689 Other abnormalities of gait and mobility: Secondary | ICD-10-CM

## 2018-11-20 NOTE — Therapy (Signed)
[facility] [address]  [address] [place], [place], [address] Phone: [phone]   Fax:  [phone]  Physical Therapy Treatment  Patient Details  Name: Justin Townsend L Eichinger MRN: [id number] Date of Birth: Jan 23, 1961 Referring Provider (PT): Annell GreeningMark Yates, MD   Encounter Date: 11/20/2018  PT End of Session - 11/20/18 0847    Visit Number  6    Number of Visits  16    Date for PT Re-Evaluation  12/27/18    Authorization Type  Prior authorization required (Generic Commercial)    Authorization Time Period  11/01/18 - 11/29/18    Authorization - Visit Number  6    Authorization - Number of Visits  13   12 visits + eval   PT Start Time  0845    PT Stop Time  0930    PT Time Calculation (min)  45 min    Activity Tolerance  Patient tolerated treatment well    Behavior During Therapy  [facility] for tasks assessed/performed;Impulsive   very distracted      Past Medical History:  Diagnosis Date  . Anxiety   . Asthma    only as child; resolved by age 57  . Fatty liver   . Gallstones   . GERD (gastroesophageal reflux disease)   . History of hiatal hernia   . Hyperlipidemia   . Hypertension   . Pneumonia     Past Surgical History:  Procedure Laterality Date  . no colonoscopy     SOC reviewed  . ORIF TOE FRACTURE Right    foot  . QUADRICEPS TENDON REPAIR Left 09/19/2018   Procedure: REPAIR LEFT KNEE QUADRICEPS TENDON;  Surgeon: Eldred MangesYates, Mark C, MD;  [facility];  Service: Orthopedics;  Laterality: Left;  . Thumb Surgery Left    pinning post dislocation    There were no vitals filed for this visit.  Subjective Assessment - 11/20/18 0845    Subjective  Pt. reporting, L knee "stiffness" today following lots of shopping yesterday.      Pertinent History  L quad tendon repair 09/19/18    Patient Stated Goals  "To get back to work and walk normally."    Currently in Pain?  No/denies    Multiple Pain Sites  No         [facility] PT  Assessment - 11/20/18 0902      AROM   Left Knee Extension  0    Left Knee Flexion  108                   [facility] Adult PT Treatment/Exercise - 11/20/18 0858      Knee/Hip Exercises: Stretches   Quad Stretch  Left;60 seconds;1 rep    LobbyistQuad Stretch Limitations  prone with strap       Knee/Hip Exercises: Aerobic   Recumbent Bike  Lvl 1, 6 min      Knee/Hip Exercises: Standing   Heel Raises  Both;20 reps;2 sets    Heel Raises Limitations  cues for quad & glute set with heel raise    Forward Lunges  Right;Left;10 reps;3 seconds   at TM rail    Forward Lunges Limitations  Required heavy cueing for proper LE spacing and upright trunk and wt. bearing through L LE    Terminal Knee Extension  Left;15 reps;Theraband    Theraband Level (Terminal Knee Extension)  Level 4 (Blue)    Terminal Knee Extension Limitations  3" TKE with  toe elevation and cues for L LE wt. shift    Cues required for pacing throughout    Wall Squat  15 reps;3 seconds    Wall Squat Limitations  Cues for pacing and even wt. distribution     Other Standing Knee Exercises  Alternating SLS 5" x 5 reps each LE on airex pad    intermittent UE support on counter      Knee/Hip Exercises: Seated   Long Arc Quad  Left;20 reps    Long Arc Quad Limitations  Cues required for 3" hold at [facility] and for pacing       Knee/Hip Exercises: Supine   Straight Leg Raises  Left;20 reps;Strengthening    Straight Leg Raises Limitations  2# - cues for quad set prior to initiation of lift             PT Education - 11/20/18 1046    Education Details  HEP update     Person(s) Educated  Patient    Methods  Explanation;Demonstration;Verbal cues;Handout    Comprehension  Verbalized understanding;Returned demonstration;Verbal cues required;Need further instruction       PT Short Term Goals - 11/07/18 1039      PT SHORT TERM GOAL #1   Title  Independent with initial HEP    Status  Achieved        PT Long Term Goals -  11/05/18 0851      PT LONG TERM GOAL #1   Title  Independent with advance/ongoing HEP    Status  On-going      PT LONG TERM GOAL #2   Title  L knee AROM >/= 0-125 to allow for normal gait and mobility    Status  On-going      PT LONG TERM GOAL #3   Title  L hip and knee strength >/= 4+/5 for improved stability    Status  On-going      PT LONG TERM GOAL #4   Title  Pt will ambulate with normal gait pattern w/o evidence of L quad instability    Status  On-going      PT LONG TERM GOAL #5   Title  Pt will negotiate stairs with good reciprocal pattern to allow improved access to all levels of home    Status  On-going      PT LONG TERM GOAL #6   Title  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 continues to be easily distractible today with activities in session requiring near continuous cueing for proper pacing, hold times, and L wt. shift with standing activities.  Able to demo improved L knee AROM today 0-108 dg following prone quad stretching.  HEP updated.  Heavy cueing required with forward lunge and wall squat for proper depth and alignment.  Min quad lag still evident with SLR today in session and pt. made aware of this and encouraged to perform quad set before each SLR rep at home for hopeful improvement in TKE control.  Will continue to progress per protocol.      Rehab Potential  Good    PT Frequency  2x / week    PT Duration  8 weeks    PT Treatment/Interventions  Patient/family education;Therapeutic  exercise;Therapeutic activities;Neuromuscular re-education;Balance training;ADLs/Self Care Home Management;Manual techniques;Scar mobilization;Dry needling;Taping;Electrical Stimulation;Cryotherapy;Vasopneumatic Device    PT Next Visit Plan  L knee ROM & LE strengthening per protocol    Consulted and Agree with  Plan of Care  Patient       Patient will benefit from skilled therapeutic intervention in order to improve the following deficits and impairments:  Decreased range of motion, Impaired flexibility, Decreased strength, Difficulty walking, Abnormal gait, Decreased activity tolerance, Decreased balance, Decreased scar mobility  Visit Diagnosis: Stiffness of left knee, not elsewhere classified  Muscle weakness (generalized)  Difficulty in walking, not elsewhere classified  Other abnormalities of gait and mobility     Problem List Patient Active Problem List   Diagnosis Date Noted  . Quadriceps tendon rupture 09/19/2018  . Rupture of left quadriceps tendon 09/18/2018  . Generalized anxiety disorder 08/17/2016  . Non-compliant behavior 08/12/2015  . SNORING, HX OF 04/29/2010  . DYSPHAGIA UNSPECIFIED 01/13/2009  . HYPERLIPIDEMIA 12/25/2007  . Essential hypertension 12/25/2007  . NONSPEC ELEVATION OF LEVELS OF TRANSAMINASE/LDH 12/25/2007    Kermit Balo, PTA 11/20/18 10:56 AM   [facility] [address]  [address] [place], [place], [address] Phone: [phone]   Fax:  [phone]  Name: THEOTIS GERDEMAN MRN: [id number] Date of Birth: 12-12-1960

## 2018-11-22 ENCOUNTER — Ambulatory Visit: Payer: PRIVATE HEALTH INSURANCE | Admitting: Family Medicine

## 2018-11-23 ENCOUNTER — Ambulatory Visit: Payer: PRIVATE HEALTH INSURANCE | Attending: Orthopaedic Surgery | Admitting: Physical Therapy

## 2018-11-23 ENCOUNTER — Encounter: Payer: Self-pay | Admitting: Physical Therapy

## 2018-11-23 DIAGNOSIS — M25662 Stiffness of left knee, not elsewhere classified: Secondary | ICD-10-CM | POA: Diagnosis not present

## 2018-11-23 DIAGNOSIS — R262 Difficulty in walking, not elsewhere classified: Secondary | ICD-10-CM | POA: Diagnosis present

## 2018-11-23 DIAGNOSIS — R2689 Other abnormalities of gait and mobility: Secondary | ICD-10-CM

## 2018-11-23 DIAGNOSIS — M6281 Muscle weakness (generalized): Secondary | ICD-10-CM | POA: Diagnosis present

## 2018-11-23 NOTE — Therapy (Signed)
[facility] [address]  [place], [place], [address] Phone: [phone]   Fax:  [phone]  Physical Therapy Treatment  Patient Details  Name: Justin Townsend MRN: [id number] Date of Birth: 06-Apr-1961 Referring Provider (PT): Justin Perna, MD   Encounter Date: 11/23/2018  PT End of Session - 11/23/18 0848    Visit Number  7    Number of Visits  16    Date for PT Re-Evaluation  12/27/18    Authorization Type  Prior authorization required (Generic Commercial)    Authorization Time Period  11/01/18 - 11/29/18    Authorization - Visit Number  7    Authorization - Number of Visits  13   12 visits + eval   PT Start Time  0848    PT Stop Time  0943    PT Time Calculation (min)  55 min    Activity Tolerance  Patient tolerated treatment well    Behavior During Therapy  [facility] for tasks assessed/performed;Impulsive   very distracted      Past Medical History:  Diagnosis Date  . Anxiety   . Asthma    only as child; resolved by age 41  . Fatty liver   . Gallstones   . GERD (gastroesophageal reflux disease)   . History of hiatal hernia   . Hyperlipidemia   . Hypertension   . Pneumonia     Past Surgical History:  Procedure Laterality Date  . no colonoscopy     SOC reviewed  . ORIF TOE FRACTURE Right    foot  . QUADRICEPS TENDON REPAIR Left 09/19/2018   Procedure: REPAIR LEFT KNEE QUADRICEPS TENDON;  Surgeon: Justin Killings, MD;  Location: [facility];  Service: Orthopedics;  Laterality: Left;  . Thumb Surgery Left    pinning post dislocation    There were no vitals filed for this visit.  Subjective Assessment - 11/23/18 0850    Subjective  Pt noting increased swelling at the end of the day after walking alot, but typically gone by the next day.    Pertinent History  L quad tendon repair 09/19/18    Patient Stated Goals  "To get back to work and walk normally."    Currently in Pain?  No/denies         [facility] PT  Assessment - 11/23/18 0848      Assessment   Next MD Visit  12/12/18                   [facility] Adult PT Treatment/Exercise - 11/23/18 0848      Knee/Hip Exercises: Aerobic   Recumbent Bike  L1 x 6 min      Knee/Hip Exercises: Standing   Heel Raises  Both;20 reps;3 seconds    Heel Raises Limitations  B con/L ecc    Hip Flexion  Left;Right;10 reps;Knee straight;Stengthening    Hip Flexion Limitations  yellow TB, 1 pole A    Side Lunges  Left;15 reps;3 seconds    Hip ADduction  Left;Right;10 reps;Strengthening    Hip ADduction Limitations  yellow TB, 1 pole A    Hip Abduction  Left;Right;10 reps;Knee straight;Stengthening    Abduction Limitations  yellow TB, 1 pole A    Hip Extension  Left;10 reps;Knee straight;Stengthening    Extension Limitations  yellow TB, 1 pole A    Functional Squat  15 reps;3 seconds    Functional Squat Limitations  TRX - verbal, tactile &  visual cues with mirror for even weight shift    Other Standing Knee Exercises  B sidestepping & fwd/back monster walk with looped red TB at ankles 2 x 30 ft      Knee/Hip Exercises: Seated   Hamstring Curl  Left;20 reps;Strengthening    Hamstring Limitations  red TB - cues for slow controlled motion esp on eccentric release      Modalities   Modalities  Vasopneumatic      Vasopneumatic   Number Minutes Vasopneumatic   10 minutes    Vasopnuematic Location   Knee   L   Vasopneumatic Pressure  Medium    Vasopneumatic Temperature   coldest temp.                 PT Short Term Goals - 11/07/18 1039      PT SHORT TERM GOAL #1   Title  Independent with initial HEP    Status  Achieved        PT Long Term Goals - 11/23/18 0943      PT LONG TERM GOAL #1   Title  Independent with advance/ongoing HEP    Status  Partially Met      PT LONG TERM GOAL #2   Title  L knee AROM >/= 0-125 to allow for normal gait and mobility    Status  On-going      PT LONG TERM GOAL #3   Title  L hip and knee  strength >/= 4+/5 for improved stability    Status  On-going      PT LONG TERM GOAL #4   Title  Pt will ambulate with normal gait pattern w/o evidence of L quad instability    Status  Partially Met      PT LONG TERM GOAL #5   Title  Pt will negotiate stairs with good reciprocal pattern to allow improved access to all levels of home    Status  On-going      PT LONG TERM GOAL #6   Title  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 L knee ROM progressing as expected per surgical rehab protocol with patient now able to achieve full revolutions on recumbent bike. L LE strength and quad control improving with patient able to ambulate w/o brace w/o evidence of knee/quad instability but continues to favor L LE with decreased weight shift and decreased weight bearing on L with most exercises. Today's session targeting increased weight to L with SLS and L single limb support exercises as well as proprioceptive training for improved balance - patient continues to require extensive cueing for proper technique, good weight shift to L and controlled pacing with most exercises. LTG's #1 & 4 now partially met. 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 7 weeks remain in current "moderate protection phase" of post-op rehab protocol at which time patient will progress to "light activity phase"  of protocol.    Rehab Potential  Good    PT Frequency  2x / week    PT Duration  8 weeks    PT Treatment/Interventions  Patient/family education;Therapeutic  exercise;Therapeutic activities;Neuromuscular re-education;Balance training;ADLs/Self Care Home Management;Manual techniques;Scar mobilization;Dry needling;Taping;Electrical Stimulation;Cryotherapy;Vasopneumatic Device    PT Next Visit Plan  L knee ROM & LE strengthening per protocol    Consulted and Agree with Plan of Care  Patient       Patient will benefit from skilled therapeutic intervention in order to improve the following deficits and impairments:  Decreased range of motion, Impaired flexibility, Decreased strength, Difficulty walking, Abnormal gait, Decreased activity tolerance, Decreased balance, Decreased scar mobility  Visit Diagnosis: Stiffness of left knee, not elsewhere classified  Muscle weakness (generalized)  Difficulty in walking, not elsewhere classified  Other abnormalities of gait and mobility     Problem List Patient Active Problem List   Diagnosis Date Noted  . Quadriceps tendon rupture 09/19/2018  . Rupture of left quadriceps tendon 09/18/2018  . Generalized anxiety disorder 08/17/2016  . Non-compliant behavior 08/12/2015  . SNORING, HX OF 04/29/2010  . DYSPHAGIA UNSPECIFIED 01/13/2009  . HYPERLIPIDEMIA 12/25/2007  . Essential hypertension 12/25/2007  . NONSPEC ELEVATION OF LEVELS OF TRANSAMINASE/LDH 12/25/2007    Percival Spanish, PT, MPT 11/23/2018, 10:21 AM  [facility] [address]  [place], [place], [address] Phone: [phone]   Fax:  [phone]  Name: Justin Townsend MRN: [id number] Date of Birth: 09-30-1961

## 2018-11-26 ENCOUNTER — Ambulatory Visit: Payer: PRIVATE HEALTH INSURANCE

## 2018-11-26 DIAGNOSIS — M6281 Muscle weakness (generalized): Secondary | ICD-10-CM

## 2018-11-26 DIAGNOSIS — R262 Difficulty in walking, not elsewhere classified: Secondary | ICD-10-CM

## 2018-11-26 DIAGNOSIS — M25662 Stiffness of left knee, not elsewhere classified: Secondary | ICD-10-CM

## 2018-11-26 DIAGNOSIS — R2689 Other abnormalities of gait and mobility: Secondary | ICD-10-CM

## 2018-11-26 NOTE — Therapy (Signed)
[facility] [place] [facility] [address]  [place], [place], [address] Phone: [phone]   Fax:  [phone]  Physical Therapy Treatment  Patient Details  Name: Justin Townsend MRN: [id number] Date of Birth: [birth year] Referring Provider (PT): Rodell Perna, MD   Encounter Date: 11/26/2018  PT End of Session - 11/26/18 0851    Visit Number  8    Number of Visits  16    Date for PT Re-Evaluation  12/27/18    Authorization Type  Prior authorization required (Generic Commercial)    Authorization Time Period  11/01/18 - 11/29/18    Authorization - Visit Number  8    Authorization - Number of Visits  13   12 visits + eval   PT Start Time  2423    PT Stop Time  0929    PT Time Calculation (min)  42 min    Activity Tolerance  Patient tolerated treatment well    Behavior During Therapy  [facility] for tasks assessed/performed;Impulsive   very distracted      Past Medical History:  Diagnosis Date  . Anxiety   . Asthma    only as child; resolved by age 3  . Fatty liver   . Gallstones   . GERD (gastroesophageal reflux disease)   . History of hiatal hernia   . Hyperlipidemia   . Hypertension   . Pneumonia     Past Surgical History:  Procedure Laterality Date  . no colonoscopy     SOC reviewed  . ORIF TOE FRACTURE Right    foot  . QUADRICEPS TENDON REPAIR Left 09/19/2018   Procedure: REPAIR LEFT KNEE QUADRICEPS TENDON;  Surgeon: Marybelle Killings, MD;  Location: [facility];  Service: Orthopedics;  Laterality: Left;  . Thumb Surgery Left    pinning post dislocation    There were no vitals filed for this visit.  Subjective Assessment - 11/26/18 0850    Subjective  Pt. doing well today with no new complaints.      Pertinent History  L quad tendon repair 09/19/18    Patient Stated Goals  "To get back to work and walk normally."    Currently in Pain?  No/denies    Multiple Pain Sites  No                       [facility] Adult PT  Treatment/Exercise - 11/26/18 0857      Knee/Hip Exercises: Stretches   Quad Stretch  Left;60 seconds;1 rep    Sports administrator Limitations  prone with strap     Hip Flexor Stretch  Left;60 seconds;2 reps    Hip Flexor Stretch Limitations  mod thomas stretch with strap     Knee: Self-Stretch to increase Flexion  Left   5" x 15 reps      Knee/Hip Exercises: Aerobic   Recumbent Bike  L1 x 7 min      Knee/Hip Exercises: Standing   Heel Raises  Both;20 reps    Heel Raises Limitations  at TM    Forward Lunges  Right;Left;10 reps;3 seconds    Forward Lunges Limitations  Required heavy cueing for proper LE spacing and upright trunk and wt. bearing through L LE    Wall Squat  3 seconds;20 reps    Wall Squat Limitations  Cues for pacing and even wt. distribution     SLS  L SLS 2 x 20 sec  SLS with Vectors  L SLS 2 x 20 on foam with opposite LE cone toe-touch; intermittent UE support on chair; supervision from therapist              PT Education - 11/26/18 1225    Education Details  HEP update    Person(s) Educated  Patient    Methods  Explanation;Demonstration;Verbal cues;Handout    Comprehension  Verbalized understanding;Returned demonstration;Verbal cues required;Need further instruction       PT Short Term Goals - 11/07/18 1039      PT SHORT TERM GOAL #1   Title  Independent with initial HEP    Status  Achieved        PT Long Term Goals - 11/23/18 0943      PT LONG TERM GOAL #1   Title  Independent with advance/ongoing HEP    Status  Partially Met      PT LONG TERM GOAL #2   Title  L knee AROM >/= 0-125 to allow for normal gait and mobility    Status  On-going      PT LONG TERM GOAL #3   Title  L hip and knee strength >/= 4+/5 for improved stability    Status  On-going      PT LONG TERM GOAL #4   Title  Pt will ambulate with normal gait pattern w/o evidence of L quad instability    Status  Partially Met      PT LONG TERM GOAL #5   Title  Pt will negotiate  stairs with good reciprocal pattern to allow improved access to all levels of home    Status  On-going      PT LONG TERM GOAL #6   Title  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  Able to perform forward lunge today requiring somewhat less cueing for proper technique.  Tolerated progression of wall squat well today however still requiring cueing for proper wt. shift. over L LE.  Pt. HEP updated today to include additional LE stretching.  Progressing well toward goals.      Rehab Potential  Good    PT Frequency  2x / week    PT Duration  8 weeks    PT Treatment/Interventions  Patient/family education;Therapeutic exercise;Therapeutic activities;Neuromuscular re-education;Balance training;ADLs/Self Care Home Management;Manual techniques;Scar mobilization;Dry needling;Taping;Electrical Stimulation;Cryotherapy;Vasopneumatic Device    PT Next Visit Plan  L knee ROM & LE strengthening per protocol    Consulted and Agree with Plan of Care  Patient       Patient will benefit from skilled therapeutic intervention in order to improve the following deficits and impairments:  Decreased range of motion, Impaired flexibility, Decreased strength, Difficulty walking, Abnormal gait, Decreased activity tolerance, Decreased balance, Decreased scar mobility  Visit Diagnosis: Stiffness of left knee, not elsewhere classified  Muscle weakness (generalized)  Difficulty in walking, not elsewhere classified  Other abnormalities of gait and mobility     Problem List Patient Active Problem List   Diagnosis Date Noted  . Quadriceps tendon rupture 09/19/2018  . Rupture of left quadriceps tendon 09/18/2018  . Generalized anxiety disorder 08/17/2016  . Non-compliant behavior 08/12/2015  .  SNORING, HX OF 04/29/2010  .  DYSPHAGIA UNSPECIFIED 01/13/2009  . HYPERLIPIDEMIA 12/25/2007  . Essential hypertension 12/25/2007  . NONSPEC ELEVATION OF LEVELS OF TRANSAMINASE/LDH 12/25/2007    Bess Harvest, PTA 11/26/18 1:38 PM   [facility] [address]  [place], [place], [address] Phone: [phone]   Fax:  [phone]  Name: TAINO MAERTENS MRN: [id number] Date of Birth: 05-May-1961

## 2018-11-27 ENCOUNTER — Encounter (INDEPENDENT_AMBULATORY_CARE_PROVIDER_SITE_OTHER): Payer: Self-pay | Admitting: Orthopaedic Surgery

## 2018-11-28 ENCOUNTER — Encounter (INDEPENDENT_AMBULATORY_CARE_PROVIDER_SITE_OTHER): Payer: Self-pay | Admitting: Orthopaedic Surgery

## 2018-11-28 ENCOUNTER — Ambulatory Visit (INDEPENDENT_AMBULATORY_CARE_PROVIDER_SITE_OTHER): Payer: PRIVATE HEALTH INSURANCE | Admitting: Orthopaedic Surgery

## 2018-11-28 VITALS — BP 143/83 | HR 80 | Ht 71.0 in | Wt 256.0 lb

## 2018-11-28 DIAGNOSIS — S76112D Strain of left quadriceps muscle, fascia and tendon, subsequent encounter: Secondary | ICD-10-CM

## 2018-11-28 MED ORDER — LIDOCAINE HCL 1 % IJ SOLN
0.5000 mL | INTRAMUSCULAR | Status: AC | PRN
  Administered 2018-11-28: .5 mL

## 2018-11-28 NOTE — Progress Notes (Deleted)
   Post-Op Visit Note   Patient: Justin Townsend           Date of Birth: [birth year]           MRN: [id number] Visit Date: 11/28/2018 PCP: Sharlene Dory, DO   Assessment & Plan:  Chief Complaint: No chief complaint on file.  Visit Diagnoses: No diagnosis found.  Plan: ***  Follow-Up Instructions: No follow-ups on file.   Orders:  No orders of the defined types were placed in this encounter.  No orders of the defined types were placed in this encounter.   Imaging: No results found.  PMFS History: Patient Active Problem List   Diagnosis Date Noted  . Quadriceps tendon rupture 09/19/2018  . Rupture of left quadriceps tendon 09/18/2018  . Generalized anxiety disorder 08/17/2016  . Non-compliant behavior 08/12/2015  . SNORING, HX OF 04/29/2010  . DYSPHAGIA UNSPECIFIED 01/13/2009  . HYPERLIPIDEMIA 12/25/2007  . Essential hypertension 12/25/2007  . NONSPEC ELEVATION OF LEVELS OF TRANSAMINASE/LDH 12/25/2007   Past Medical History:  Diagnosis Date  . Anxiety   . Asthma    only as child; resolved by age 66  . Fatty liver   . Gallstones   . GERD (gastroesophageal reflux disease)   . History of hiatal hernia   . Hyperlipidemia   . Hypertension   . Pneumonia     Family History  Problem Relation Age of Onset  . Ulcers Mother   . Cirrhosis Mother        ? Alcohol Related   . Cancer Paternal Grandfather        CNS Cancer  . Panic disorder Other        Maternal FH  . Anxiety disorder Other        Maternal FH  . Heart attack Maternal Grandfather        >55  . Diabetes Neg Hx   . Stroke Neg Hx   . Colon cancer Neg Hx   . Colon polyps Neg Hx     Past Surgical History:  Procedure Laterality Date  . no colonoscopy     SOC reviewed  . ORIF TOE FRACTURE Right    foot  . QUADRICEPS TENDON REPAIR Left 09/19/2018   Procedure: REPAIR LEFT KNEE QUADRICEPS TENDON;  Surgeon: Eldred Manges, MD;  [facility];  Service: Orthopedics;  Laterality: Left;  . Thumb  Surgery Left    pinning post dislocation   Social History   Occupational History  . Occupation: Air traffic controller: [employer]  Tobacco Use  . Smoking status: Light Tobacco Smoker    Types: Cigars  . Smokeless tobacco: Never Used  . Tobacco comment: rare cigar  Substance and Sexual Activity  . Alcohol use: Yes    Alcohol/week: 10.0 standard drinks    Types: 10 Cans of beer per week    Comment: Socially on weekend  . Drug use: No  . Sexual activity: Yes    Partners: Female

## 2018-11-28 NOTE — Progress Notes (Signed)
Office Visit Note   Patient: Justin Townsend           Date of Birth: 1961/06/15           MRN: [id number] Visit Date: 11/28/2018              Requested by: Sharlene Dory, DO [address] [place], [place] [address] PCP: Sharlene Dory, DO   Assessment & Plan: Visit Diagnoses:  1. Rupture of left quadriceps tendon, subsequent encounter          With quad tendon repair 09/19/2018.   Plan: Post quad tendon repair with previous preop MRI showing some tricompartmental chondral wear.  With his efforts to get better range of motion and strength back he has some synovitis of his knee and clear yellow fluid was aspirated from his knee.  He can continue with this therapy but needs to slow down some since is aggravating his symptoms with some subcutaneous edema above the quad tendon repair as well as intra-articular synovitis likely related to the mild arthritic changes in his knee being aggravated by some of the quad strengthening exercises.  Therapy will do better working on isometrics straight leg raising with weights over his ankle rather than knee extension exercises.  He can still work on crossing his ankles to try to improve his ankle flexion.  He can use his knee immobilizer about half the time to help settle down his knee and continue to take ibuprofen that he has at home to help with the knee effusion.  I plan to recheck him in 1 month.  We reviewed with him the preop MRI scan that showed some chondrosis present tricompartmental with x-rays that demonstrate maintained joint space but MRI does show that he has some cartilage wear and this likely is flared up with the hard work he has been doing with therapy.  I discussed with him he can back off the therapy and do maybe 60 to 75% of what he has been doing.  He can use his knee immobilizer some at home to rest his knee when he is on his feet walking around some and continue with ibuprofen which should help with the  swelling.  Follow-Up Instructions: No follow-ups on file.   Orders:  No orders of the defined types were placed in this encounter.  No orders of the defined types were placed in this encounter.     Procedures: Large Joint Inj: L knee on 11/28/2018 2:00 PM Indications: joint swelling and pain Details: 22 G 1.5 in needle, anterolateral approach  Arthrogram: No  Medications: 0.5 mL lidocaine 1 % Aspirate: 30 mL serous and yellow Outcome: tolerated well, no immediate complications Procedure, treatment alternatives, risks and benefits explained, specific risks discussed. Consent was given by the patient. Immediately prior to procedure a time out was called to verify the correct patient, procedure, equipment, support staff and site/side marked as required. Patient was prepped and draped in the usual sterile fashion.       Clinical Data: No additional findings.   Subjective: Chief Complaint  Patient presents with  . 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  He has been working hard with therapy with stretching and strengthening.  He noticed increased swelling noticed a divot in the area of the quad tendon repair was concerned about this.  He still has slight  extension lag.  Good flexion quad strength is returning.  Review of Systems updated unchanged from surgery.  Objective: Vital Signs: BP (!) 143/83 (BP Location: Left Arm, Patient Position: Sitting)   Pulse 80   Ht 5\' 11"  (1.803 m)   Wt 256 lb (116.1 kg)   BMI 35.70 kg/m   Physical Exam no change Ortho Exam patient has some subcutaneous swelling over the quad tendon repair.  He also has knee effusion and today we aspirated 30 cc clear yellow fluid without cloudiness no purulence and no blood aspirated from his knee.  Specialty Comments:  No specialty comments available.  Imaging: No results found.   PMFS History: Patient  Active Problem List   Diagnosis Date Noted  . Quadriceps tendon rupture 09/19/2018  . Rupture of left quadriceps tendon 09/18/2018  . Generalized anxiety disorder 08/17/2016  . Non-compliant behavior 08/12/2015  . SNORING, HX OF 04/29/2010  . DYSPHAGIA UNSPECIFIED 01/13/2009  . HYPERLIPIDEMIA 12/25/2007  . Essential hypertension 12/25/2007  . NONSPEC ELEVATION OF LEVELS OF TRANSAMINASE/LDH 12/25/2007   Past Medical History:  Diagnosis Date  . Anxiety   . Asthma    only as child; resolved by age 29  . Fatty liver   . Gallstones   . GERD (gastroesophageal reflux disease)   . History of hiatal hernia   . Hyperlipidemia   . Hypertension   . Pneumonia     Family History  Problem Relation Age of Onset  . Ulcers Mother   . Cirrhosis Mother        ? Alcohol Related   . Cancer Paternal Grandfather        CNS Cancer  . Panic disorder Other        Maternal FH  . Anxiety disorder Other        Maternal FH  . Heart attack Maternal Grandfather        >55  . Diabetes Neg Hx   . Stroke Neg Hx   . Colon cancer Neg Hx   . Colon polyps Neg Hx     Past Surgical History:  Procedure Laterality Date  . no colonoscopy     SOC reviewed  . ORIF TOE FRACTURE Right    foot  . QUADRICEPS TENDON REPAIR Left 09/19/2018   Procedure: REPAIR LEFT KNEE QUADRICEPS TENDON;  Surgeon: Eldred Manges, MD;  Location: [facility];  Service: Orthopedics;  Laterality: Left;  . Thumb Surgery Left    pinning post dislocation   Social History   Occupational History  . Occupation: Air traffic controller: [employer]  Tobacco Use  . Smoking status: Light Tobacco Smoker    Types: Cigars  . Smokeless tobacco: Never Used  . Tobacco comment: rare cigar  Substance and Sexual Activity  . Alcohol use: Yes    Alcohol/week: 10.0 standard drinks    Types: 10 Cans of beer per week    Comment: Socially on weekend  . Drug use: No  . Sexual activity: Yes    Partners: Female

## 2018-11-29 ENCOUNTER — Ambulatory Visit: Payer: PRIVATE HEALTH INSURANCE

## 2018-11-29 DIAGNOSIS — R2689 Other abnormalities of gait and mobility: Secondary | ICD-10-CM

## 2018-11-29 DIAGNOSIS — R262 Difficulty in walking, not elsewhere classified: Secondary | ICD-10-CM

## 2018-11-29 DIAGNOSIS — M25662 Stiffness of left knee, not elsewhere classified: Secondary | ICD-10-CM | POA: Diagnosis not present

## 2018-11-29 DIAGNOSIS — M6281 Muscle weakness (generalized): Secondary | ICD-10-CM

## 2018-11-29 NOTE — Therapy (Signed)
[facility] [address]  [place], [place], [address] Phone: [phone]   Fax:  [phone]  Physical Therapy Treatment  Patient Details  Name: Justin Townsend MRN: [id number] Date of Birth: [birth year] Referring Provider (PT): Rodell Perna, MD   Encounter Date: 11/29/2018  PT End of Session - 11/29/18 0857    Visit Number  9    Number of Visits  16    Date for PT Re-Evaluation  12/27/18    Authorization Type  Prior authorization required (Generic Commercial)    Authorization Time Period  11/01/18 - 12/27/18    Authorization - Visit Number  9    Authorization - Number of Visits  13   12 visits + eval   PT Start Time  0852    PT Stop Time  0940    PT Time Calculation (min)  48 min    Activity Tolerance  Patient tolerated treatment well    Behavior During Therapy  [facility] for tasks assessed/performed;Impulsive   very distracted      Past Medical History:  Diagnosis Date  . Anxiety   . Asthma    only as child; resolved by age 58  . Fatty liver   . Gallstones   . GERD (gastroesophageal reflux disease)   . History of hiatal hernia   . Hyperlipidemia   . Hypertension   . Pneumonia     Past Surgical History:  Procedure Laterality Date  . no colonoscopy     SOC reviewed  . ORIF TOE FRACTURE Right    foot  . QUADRICEPS TENDON REPAIR Left 09/19/2018   Procedure: REPAIR LEFT KNEE QUADRICEPS TENDON;  Surgeon: Marybelle Killings, MD;  Location: [facility];  Service: Orthopedics;  Laterality: Left;  . Thumb Surgery Left    pinning post dislocation    There were no vitals filed for this visit.  Subjective Assessment - 11/29/18 0853    Subjective  Pt. reporting MD recommended him to wear knee brace throughout day some to reduce knee irritation.  MD removing fluid off L knee.      Pertinent History  L quad tendon repair 09/19/18    Patient Stated Goals  "To get back to work and walk normally."    Currently in Pain?  No/denies     Multiple Pain Sites  No                       [facility] Adult PT Treatment/Exercise - 11/29/18 0905      Knee/Hip Exercises: Aerobic   Recumbent Bike  L1 x 7 min      Knee/Hip Exercises: Standing   Heel Raises  Both;20 reps    Heel Raises Limitations  at TM    Terminal Knee Extension  Left;15 reps;Theraband   cues for technique   Theraband Level (Terminal Knee Extension)  Level 4 (Blue)    Terminal Knee Extension Limitations  5" TKE with toe elevation and cues for L LE wt. shift       Knee/Hip Exercises: Seated   Hamstring Curl  Left;15 reps    Hamstring Limitations  green TB - cues for slow controlled motion esp on eccentric release      Knee/Hip Exercises: Supine   Quad Sets  Left;15 reps    Quad Sets Limitations  5" hold     Straight Leg Raises  Left;20 reps    Straight Leg Raises Limitations  cues for  quad set prior to each rep      Vasopneumatic   Number Minutes Vasopneumatic   10 minutes    Vasopnuematic Location   Knee    Vasopneumatic Pressure  Medium    Vasopneumatic Temperature   coldest temp.        Manual Therapy   Manual Therapy  Joint mobilization    Manual therapy comments  supine     Joint Mobilization  L patellar mobs all directions for improved ROM     Soft tissue mobilization  STM to L distal quad; Incisional scar tissue massage to L knee incision               PT Short Term Goals - 11/07/18 1039      PT SHORT TERM GOAL #1   Title  Independent with initial HEP    Status  Achieved        PT Long Term Goals - 11/23/18 0943      PT LONG TERM GOAL #1   Title  Independent with advance/ongoing HEP    Status  Partially Met      PT LONG TERM GOAL #2   Title  L knee AROM >/= 0-125 to allow for normal gait and mobility    Status  On-going      PT LONG TERM GOAL #3   Title  L hip and knee strength >/= 4+/5 for improved stability    Status  On-going      PT LONG TERM GOAL #4   Title  Pt will ambulate with normal gait  pattern w/o evidence of L quad instability    Status  Partially Met      PT LONG TERM GOAL #5   Title  Pt will negotiate stairs with good reciprocal pattern to allow improved access to all levels of home    Status  On-going      PT LONG TERM GOAL #6   Title  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  MD instructing pt. to lessen intensity of home program and therapy sessions to allow L knee irritation to subside however pt. reporting MD pleased with ROM.  Session focusing on gentle TKE strengthening and L knee ROM activities with pt. tolerating all well without pain.  Does still require cueing for proper pacing, hold times, and overall technique throughout session.  Ended visit with ice/compression to L knee to reduce post-exercise soreness and swelling.      Rehab Potential  Good    PT Frequency  2x / week    PT Duration  8 weeks    PT Treatment/Interventions  Patient/family education;Therapeutic exercise;Therapeutic activities;Neuromuscular re-education;Balance training;ADLs/Self Care Home Management;Manual techniques;Scar mobilization;Dry needling;Taping;Electrical Stimulation;Cryotherapy;Vasopneumatic Device       Patient will benefit from skilled therapeutic intervention in order to improve the following deficits and impairments:  Decreased range of motion, Impaired flexibility, Decreased strength, Difficulty walking, Abnormal gait, Decreased activity tolerance, Decreased balance, Decreased scar mobility  Visit Diagnosis: Stiffness of left knee, not elsewhere classified  Muscle weakness (generalized)  Difficulty in walking, not elsewhere classified  Other abnormalities of gait and mobility  Problem List Patient Active  Problem List   Diagnosis Date Noted  . Quadriceps tendon rupture 09/19/2018  . Rupture of left quadriceps tendon 09/18/2018  . Generalized anxiety disorder 08/17/2016  . Non-compliant behavior 08/12/2015  . SNORING, HX OF 04/29/2010  . DYSPHAGIA UNSPECIFIED 01/13/2009  . HYPERLIPIDEMIA 12/25/2007  . Essential hypertension 12/25/2007  . NONSPEC ELEVATION OF LEVELS OF TRANSAMINASE/LDH 12/25/2007    Bess Harvest, PTA 11/29/18 12:07 PM    [facility] [address]  [place], [place], [address] Phone: [phone]   Fax:  [phone]  Name: ALOK MINSHALL MRN: [id number] Date of Birth: 12/26/1960

## 2018-12-03 ENCOUNTER — Encounter: Payer: Self-pay | Admitting: Physical Therapy

## 2018-12-03 ENCOUNTER — Ambulatory Visit: Payer: PRIVATE HEALTH INSURANCE | Admitting: Physical Therapy

## 2018-12-03 DIAGNOSIS — M6281 Muscle weakness (generalized): Secondary | ICD-10-CM

## 2018-12-03 DIAGNOSIS — M25662 Stiffness of left knee, not elsewhere classified: Secondary | ICD-10-CM | POA: Diagnosis not present

## 2018-12-03 DIAGNOSIS — R2689 Other abnormalities of gait and mobility: Secondary | ICD-10-CM

## 2018-12-03 DIAGNOSIS — R262 Difficulty in walking, not elsewhere classified: Secondary | ICD-10-CM

## 2018-12-03 NOTE — Therapy (Signed)
[facility] [address]  [place], [place], [address] Phone: [phone]   Fax:  [phone]  Physical Therapy Treatment  Patient Details  Name: Justin Townsend MRN: [id number] Date of Birth: 1961/03/11 Referring Provider (PT): Rodell Perna, MD   Encounter Date: 12/03/2018  PT End of Session - 12/03/18 0844    Visit Number  10    Number of Visits  16    Date for PT Re-Evaluation  12/27/18    Authorization Type  Prior authorization required (Generic Commercial)    Authorization Time Period  11/01/18 - 12/27/18    Authorization - Visit Number  10    Authorization - Number of Visits  13   12 visits + eval   PT Start Time  5638    PT Stop Time  0936    PT Time Calculation (min)  52 min    Activity Tolerance  Patient tolerated treatment well    Behavior During Therapy  [facility] for tasks assessed/performed;Impulsive   very distracted      Past Medical History:  Diagnosis Date  . Anxiety   . Asthma    only as child; resolved by age 25  . Fatty liver   . Gallstones   . GERD (gastroesophageal reflux disease)   . History of hiatal hernia   . Hyperlipidemia   . Hypertension   . Pneumonia     Past Surgical History:  Procedure Laterality Date  . no colonoscopy     SOC reviewed  . ORIF TOE FRACTURE Right    foot  . QUADRICEPS TENDON REPAIR Left 09/19/2018   Procedure: REPAIR LEFT KNEE QUADRICEPS TENDON;  Surgeon: Marybelle Killings, MD;  Location: [facility];  Service: Orthopedics;  Laterality: Left;  . Thumb Surgery Left    pinning post dislocation    There were no vitals filed for this visit.  Subjective Assessment - 12/03/18 0844    Subjective  Pt reporting return to MD on 12/12/18 at which time they will determine when ha can go back to work.    Pertinent History  L quad tendon repair 09/19/18    Patient Stated Goals  "To get back to work and walk normally."    Currently in Pain?  No/denies         [facility] PT  Assessment - 12/03/18 0844      AROM   Left Knee Extension  0   4-5 dg quad lag with SLR   Left Knee Flexion  127                   [facility] Adult PT Treatment/Exercise - 12/03/18 0844      Exercises   Exercises  Knee/Hip      Knee/Hip Exercises: Aerobic   Recumbent Bike  L2 x 6 min      Knee/Hip Exercises: Standing   Hip Flexion  Left;Right;10 reps;Knee straight;Stengthening    Hip Flexion Limitations  red TB, 1 pole A    Hip ADduction  Left;Right;10 reps;Strengthening    Hip ADduction Limitations  red TB, 1 pole A    Hip Abduction  Left;Right;10 reps;Knee straight;Stengthening    Abduction Limitations  red TB, 1 pole A    Hip Extension  Left;10 reps;Knee straight;Stengthening    Extension Limitations  red TB, 1 pole A    SLS  L SLS on firm surface & blue foam oval 3 x 15 sec each; intermittent 1 pole A  SLS with Vectors  L SLS on blue foam oval with 3 way clock to balance pebbles 2 x 5 reps; 1 pole A      Knee/Hip Exercises: Supine   Bridges  Both;15 reps;Strengthening    Bridges Limitations  3-5" hold with red TB isometric hip abduction; cues to avoid holding breath    Straight Leg Raises  Left;15 reps;Strengthening    Straight Leg Raises Limitations  2# - cues for quad set prior to initiation of lift    Other Supine Knee/Hip Exercises  Alt hip ABD/ER with red TB x15      Knee/Hip Exercises: Sidelying   Hip ABduction  Left;15 reps;Strengthening    Hip ABduction Limitations  2#    Hip ADduction  Left;15 reps;Strengthening    Hip ADduction Limitations  2#    Clams  L/R clam with red TB x 15      Knee/Hip Exercises: Prone   Hip Extension  Left;15 reps;Strengthening    Hip Extension Limitations  2#      Modalities   Modalities  Vasopneumatic      Vasopneumatic   Number Minutes Vasopneumatic   10 minutes    Vasopnuematic Location   Knee   L   Vasopneumatic Pressure  Medium    Vasopneumatic Temperature   coldest temp.                 PT Short  Term Goals - 11/07/18 1039      PT SHORT TERM GOAL #1   Title  Independent with initial HEP    Status  Achieved        PT Long Term Goals - 11/23/18 0943      PT LONG TERM GOAL #1   Title  Independent with advance/ongoing HEP    Status  Partially Met      PT LONG TERM GOAL #2   Title  L knee AROM >/= 0-125 to allow for normal gait and mobility    Status  On-going      PT LONG TERM GOAL #3   Title  L hip and knee strength >/= 4+/5 for improved stability    Status  On-going      PT LONG TERM GOAL #4   Title  Pt will ambulate with normal gait pattern w/o evidence of L quad instability    Status  Partially Met      PT LONG TERM GOAL #5   Title  Pt will negotiate stairs with good reciprocal pattern to allow improved access to all levels of home    Status  On-going      PT LONG TERM GOAL #6   Title  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 L knee AROM progressing ahead of protocol, therefore cautioned pt to avoid aggressive quad stretching or stretching into flexion ROM at this time. L quad leg still evident with SLR, therefore emphasized cues for quad isometric proir to initiation of lift/active hip ROM.    Rehab Potential  Good    PT Frequency  2x / week    PT Duration  8 weeks    PT Treatment/Interventions  Patient/family education;Therapeutic exercise;Therapeutic activities;Neuromuscular re-education;Balance training;ADLs/Self Care Home Management;Manual techniques;Scar mobilization;Dry needling;Taping;Electrical Stimulation;Cryotherapy;Vasopneumatic Device    PT Next Visit Plan  L knee ROM & LE strengthening per protocol    Consulted and Agree  with Plan of Care  Patient       Patient will benefit from skilled therapeutic intervention in order to improve the following deficits and impairments:  Decreased range of motion, Impaired flexibility, Decreased strength, Difficulty walking, Abnormal gait, Decreased activity tolerance, Decreased balance, Decreased scar mobility  Visit Diagnosis: Stiffness of left knee, not elsewhere classified  Muscle weakness (generalized)  Difficulty in walking, not elsewhere classified  Other abnormalities of gait and mobility     Problem List Patient Active Problem List   Diagnosis Date Noted  . Quadriceps tendon rupture 09/19/2018  . Rupture of left quadriceps tendon 09/18/2018  . Generalized anxiety disorder 08/17/2016  . Non-compliant behavior 08/12/2015  . SNORING, HX OF 04/29/2010  . DYSPHAGIA UNSPECIFIED 01/13/2009  . HYPERLIPIDEMIA 12/25/2007  . Essential hypertension 12/25/2007  . NONSPEC ELEVATION OF LEVELS OF TRANSAMINASE/LDH 12/25/2007    Percival Spanish, PT, MPT 12/03/2018, 9:32 AM  [facility] [address]  [place], [place], [address] Phone: [phone]   Fax:  [phone]  Name: DEONDRICK SEARLS MRN: [id number] Date of Birth: 04/06/1961

## 2018-12-06 ENCOUNTER — Encounter: Payer: Self-pay | Admitting: Physical Therapy

## 2018-12-06 ENCOUNTER — Ambulatory Visit: Payer: PRIVATE HEALTH INSURANCE | Admitting: Physical Therapy

## 2018-12-06 DIAGNOSIS — M6281 Muscle weakness (generalized): Secondary | ICD-10-CM

## 2018-12-06 DIAGNOSIS — M25662 Stiffness of left knee, not elsewhere classified: Secondary | ICD-10-CM

## 2018-12-06 DIAGNOSIS — R262 Difficulty in walking, not elsewhere classified: Secondary | ICD-10-CM

## 2018-12-06 DIAGNOSIS — R2689 Other abnormalities of gait and mobility: Secondary | ICD-10-CM

## 2018-12-06 NOTE — Therapy (Addendum)
[facility] [address]  [place], [place], [address] Phone: [phone]   Fax:  [phone]  Physical Therapy Treatment  Patient Details  Name: Justin Townsend MRN: [id number] Date of Birth: 12-Feb-1961 Referring Provider (PT): Rodell Perna, MD   Encounter Date: 12/06/2018    PT End of Session - 12/06/18 0845    Visit Number  11    Number of Visits  16    Date for PT Re-Evaluation  12/27/18    Authorization Type  Prior authorization required (Generic Commercial)    Authorization Time Period  11/01/18 - 12/27/18    Authorization - Visit Number  11   Authorization - Number of Visits  13   12 visits + eval   PT Start Time  8676    PT Stop Time  0936    PT Time Calculation (min)  52 min    Activity Tolerance  Patient tolerated treatment well    Behavior During Therapy  [facility] for tasks assessed/performed;Impulsive       Past Medical History:  Diagnosis Date  . Anxiety   . Asthma    only as child; resolved by age 70  . Fatty liver   . Gallstones   . GERD (gastroesophageal reflux disease)   . History of hiatal hernia   . Hyperlipidemia   . Hypertension   . Pneumonia     Past Surgical History:  Procedure Laterality Date  . no colonoscopy     SOC reviewed  . ORIF TOE FRACTURE Right    foot  . QUADRICEPS TENDON REPAIR Left 09/19/2018   Procedure: REPAIR LEFT KNEE QUADRICEPS TENDON;  Surgeon: Marybelle Killings, MD;  Location: [facility];  Service: Orthopedics;  Laterality: Left;  . Thumb Surgery Left    pinning post dislocation    There were no vitals filed for this visit.  Subjective Assessment - 12/06/18 0849    Subjective  Pt reporting he has been wearing a pull-on style neoprene knee brace with patellar cut-out that he had from a previous time - states knee feels better with this support.    Pertinent History  L quad tendon repair 09/19/18    Patient Stated Goals  "To get back to work and walk normally."     Currently in Pain?  No/denies                       [facility] Adult PT Treatment/Exercise - 12/06/18 0845      Exercises   Exercises  Knee/Hip      Knee/Hip Exercises: Aerobic   Recumbent Bike  L2 x 6 min      Knee/Hip Exercises: Machines for Strengthening   Cybex Leg Press  B LE 20# x 15; L LE 10# x 15      Knee/Hip Exercises: Standing   Hip Flexion  Left;Right;15 reps;Knee straight;Stengthening    Hip Flexion Limitations  red TB - cues for L quad set prior to initiation of R SLR & avoiding L knee hyperextension ("locked" knee) during R SLR, 1 pole A    Hip ADduction  Left;Right;15 reps;Strengthening    Hip ADduction Limitations  red TB - cues for L quad set prior to initiation of R SLR & avoiding L knee hyperextension ("locked" knee) during R SLR, 1 pole A    Hip Abduction  Left;Right;15 reps;Knee straight;Stengthening    Abduction Limitations  red TB - cues for L quad set  prior to initiation of R SLR & avoiding L knee hyperextension ("locked" knee) during R SLR, 1 pole A    Hip Extension  Left;Right;15 reps;Knee straight;Stengthening    Extension Limitations  red TB - cues for L quad set prior to initiation of R SLR & avoiding L knee hyperextension ("locked" knee) during R SLR, 1 pole A    Wall Squat  10 reps;5 seconds    Wall Squat Limitations  cues for even weight shift over L LE + full 5 sec hold    Other Standing Knee Exercises  B side stepping & fwd/back monster walk with looped red TB at ankles 2 x 40 ft    Other Standing Knee Exercises  L SLS (R TTWB for balance) B pallof press with red TB x 10 each;       Modalities   Modalities  Vasopneumatic      Vasopneumatic   Number Minutes Vasopneumatic   10 minutes    Vasopnuematic Location   Knee   L   Vasopneumatic Pressure  Medium;High    Vasopneumatic Temperature   coldest temp.                 PT Short Term Goals - 11/07/18 1039      PT SHORT TERM GOAL #1   Title  Independent with initial HEP     Status  Achieved        PT Long Term Goals - 11/23/18 0943      PT LONG TERM GOAL #1   Title  Independent with advance/ongoing HEP    Status  Partially Met      PT LONG TERM GOAL #2   Title  L knee AROM >/= 0-125 to allow for normal gait and mobility    Status  On-going      PT LONG TERM GOAL #3   Title  L hip and knee strength >/= 4+/5 for improved stability    Status  On-going      PT LONG TERM GOAL #4   Title  Pt will ambulate with normal gait pattern w/o evidence of L quad instability    Status  Partially Met      PT LONG TERM GOAL #5   Title  Pt will negotiate stairs with good reciprocal pattern to allow improved access to all levels of home    Status  On-going      PT LONG TERM GOAL #6   Title  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 Slight increased edema noted at end of session, but difficult to determine if edema due to constriction from brace vs post-exercise, but session ended with vasopnematic cryotherapy compression & elevation to promote edema reduction with edema significantly less following vaso. Pt reporting he has not been icing or elevating his leg at home, therefore encouraged to resume doing so as needed for edema/swelling. Also cautioned pt to avoid pulling straps (esp upper strap) on brace too tight to avoid tourniquet effect at knee.    Rehab Potential  Good    PT Frequency  2x / week    PT Duration  8 weeks  PT Treatment/Interventions  Patient/family education;Therapeutic exercise;Therapeutic activities;Neuromuscular re-education;Balance training;ADLs/Self Care Home Management;Manual techniques;Scar mobilization;Dry needling;Taping;Electrical  Stimulation;Cryotherapy;Vasopneumatic Device    PT Next Visit Plan  L knee ROM & LE strengthening per protocol    Consulted and Agree with Plan of Care  Patient       Patient will benefit from skilled therapeutic intervention in order to improve the following deficits and impairments:  Decreased range of motion, Impaired flexibility, Decreased strength, Difficulty walking, Abnormal gait, Decreased activity tolerance, Decreased balance, Decreased scar mobility  Visit Diagnosis: Stiffness of left knee, not elsewhere classified  Muscle weakness (generalized)  Difficulty in walking, not elsewhere classified  Other abnormalities of gait and mobility     Problem List Patient Active Problem List   Diagnosis Date Noted  . Quadriceps tendon rupture 09/19/2018  . Rupture of left quadriceps tendon 09/18/2018  . Generalized anxiety disorder 08/17/2016  . Non-compliant behavior 08/12/2015  . SNORING, HX OF 04/29/2010  . DYSPHAGIA UNSPECIFIED 01/13/2009  . HYPERLIPIDEMIA 12/25/2007  . Essential hypertension 12/25/2007  . NONSPEC ELEVATION OF LEVELS OF TRANSAMINASE/LDH 12/25/2007    Percival Spanish, PT, MPT 12/06/2018, 12:19 PM  [facility] [address]  [place], [place], [address] Phone: [phone]   Fax:  [phone]  Name: Justin Townsend MRN: [id number] Date of Birth: 1961/03/25

## 2018-12-10 ENCOUNTER — Ambulatory Visit: Payer: PRIVATE HEALTH INSURANCE

## 2018-12-10 DIAGNOSIS — R262 Difficulty in walking, not elsewhere classified: Secondary | ICD-10-CM

## 2018-12-10 DIAGNOSIS — M6281 Muscle weakness (generalized): Secondary | ICD-10-CM

## 2018-12-10 DIAGNOSIS — M25662 Stiffness of left knee, not elsewhere classified: Secondary | ICD-10-CM

## 2018-12-10 DIAGNOSIS — R2689 Other abnormalities of gait and mobility: Secondary | ICD-10-CM

## 2018-12-10 NOTE — Therapy (Signed)
[facility] [address]  [place], [place], [address] Phone: [phone]   Fax:  [phone]  Physical Therapy Treatment  Patient Details  Name: Justin Townsend MRN: [id number] Date of Birth: 02/21/1961 Referring Provider (PT): Rodell Perna, MD   Encounter Date: 12/10/2018  PT End of Session - 12/10/18 0900    Visit Number  12    Number of Visits  16    Date for PT Re-Evaluation  12/27/18    Authorization Type  Prior authorization required (Generic Commercial)    Authorization Time Period  11/01/18 - 12/27/18    Authorization - Visit Number  12    Authorization - Number of Visits  13   12 visits + eval   PT Start Time  4097   pt. arrived late    PT Stop Time  0951    PT Time Calculation (min)  57 min    Activity Tolerance  Patient tolerated treatment well    Behavior During Therapy  [facility] for tasks assessed/performed;Impulsive   very distracted      Past Medical History:  Diagnosis Date  . Anxiety   . Asthma    only as child; resolved by age 36  . Fatty liver   . Gallstones   . GERD (gastroesophageal reflux disease)   . History of hiatal hernia   . Hyperlipidemia   . Hypertension   . Pneumonia     Past Surgical History:  Procedure Laterality Date  . no colonoscopy     SOC reviewed  . ORIF TOE FRACTURE Right    foot  . QUADRICEPS TENDON REPAIR Left 09/19/2018   Procedure: REPAIR LEFT KNEE QUADRICEPS TENDON;  Surgeon: Marybelle Killings, MD;  Location: [facility];  Service: Orthopedics;  Laterality: Left;  . Thumb Surgery Left    pinning post dislocation    There were no vitals filed for this visit.  Subjective Assessment - 12/10/18 0857    Subjective  Pt. reporting he has been wearing "short knee brace" mostly since last visit and donned knee immobilizer one day over weekend.      Pertinent History  L quad tendon repair 09/19/18    Patient Stated Goals  "To get back to work and walk normally."    Currently in  Pain?  No/denies    Multiple Pain Sites  No         [facility] PT Assessment - 12/10/18 0001      AROM   Left Knee Extension  0   2-3 dg quad lag with SLR   Left Knee Flexion  130      Strength   Right/Left Hip  Right;Left    Right Hip Flexion  5/5    Right Hip Extension  5/5    Right Hip ABduction  5/5    Right Hip ADduction  5/5    Left Hip Flexion  4+/5    Left Hip Extension  4+/5    Left Hip ABduction  4+/5    Left Hip ADduction  4+/5    Right/Left Knee  Right;Left    Right Knee Flexion  5/5    Right Knee Extension  5/5    Left Knee Flexion  4/5    Left Knee Extension  4/5                   [facility] Adult PT Treatment/Exercise - 12/10/18 0908      Knee/Hip Exercises: Aerobic  Nustep  Lvl 3, 6 min       Knee/Hip Exercises: Standing   SLS with Vectors  L SLS on firm surface 2 x 30 sec with opposite LE cone toe touch; L SLS on foam with opposite LE cone toe-touch x 30 sec       Knee/Hip Exercises: Supine   Bridges  Both;20 reps    Bridges Limitations  3-5" hold with red TB isometric hip abduction; cues to avoid holding breath    Straight Leg Raises  Left;15 reps;Strengthening    Straight Leg Raises Limitations  2# - cues for quad set prior to initiation of lift      Knee/Hip Exercises: Sidelying   Hip ABduction  Left;15 reps;Strengthening    Hip ABduction Limitations  2#    Hip ADduction  Left;15 reps;Strengthening    Hip ADduction Limitations  2#      Knee/Hip Exercises: Prone   Hip Extension  Left;20 reps;Strengthening    Hip Extension Limitations  2#      Vasopneumatic   Number Minutes Vasopneumatic   10 minutes    Vasopnuematic Location   Knee   L   Vasopneumatic Pressure  Medium    Vasopneumatic Temperature   coldest temp.                 PT Short Term Goals - 11/07/18 1039      PT SHORT TERM GOAL #1   Title  Independent with initial HEP    Status  Achieved        PT Long Term Goals - 12/10/18 0904      PT LONG TERM GOAL #1    Title  Independent with advance/ongoing HEP    Status  Partially Met      PT LONG TERM GOAL #2   Title  L knee AROM >/= 0-125 to allow for normal gait and mobility    Status  Partially Met   12/10/18: 2-3 dg quad lag with SLR; met for AROM flexion 130 dg      PT LONG TERM GOAL #3   Title  L hip and knee strength >/= 4+/5 for improved stability    Status  Partially Met      PT LONG TERM GOAL #4   Title  Pt will ambulate with normal gait pattern w/o evidence of L quad instability    Status  Partially Met   12/10/18: Ambulating with normal gait pattern however still with slight L quad instability noted.     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  Reports he has not been icing and elevating his knee despite encouragement in previous session.  Seen today with mild L knee swelling to start session which appeared unchanged after gentle quad isometric strengthening activities in session today.  Pt. continues with 2-3 dg quad lag with  SLR however able to demo supported L knee AROM 0-130 dg in supine today partially achieving this goal.  Still with mild L quad instability noted with gait however gait pattern normalizing.  Able to partially achieve L proximal hip/knee strength goal with MMT today primarily still weak in quad/HS muscle groups at 4/5 strength.  Ended visit with ice/compression to L knee to promote reduction in edema and encouraged pt. to ice/elevate knee a few times daily.  Progressing well per protocol.       Rehab Potential  Good    PT Frequency  2x / week    PT Duration  8 weeks    PT Treatment/Interventions  Patient/family education;Therapeutic exercise;Therapeutic activities;Neuromuscular re-education;Balance training;ADLs/Self Care Home Management;Manual techniques;Scar mobilization;Dry needling;Taping;Electrical Stimulation;Cryotherapy;Vasopneumatic Device    PT Next Visit Plan  L knee ROM & LE strengthening per protocol    Consulted and Agree with Plan of Care  Patient       Patient will benefit from skilled therapeutic intervention in order to improve the following deficits and impairments:  Decreased range of motion, Impaired flexibility, Decreased strength, Difficulty walking, Abnormal gait, Decreased activity tolerance, Decreased balance, Decreased scar mobility  Visit Diagnosis: Stiffness of left knee, not elsewhere classified  Muscle weakness (generalized)  Difficulty in walking, not elsewhere classified  Other abnormalities of gait and mobility     Problem List Patient Active Problem List   Diagnosis Date Noted  . Quadriceps tendon rupture 09/19/2018  . Rupture of left quadriceps tendon 09/18/2018  . Generalized anxiety disorder 08/17/2016  . Non-compliant behavior 08/12/2015  . SNORING, HX OF 04/29/2010  . DYSPHAGIA UNSPECIFIED 01/13/2009  . HYPERLIPIDEMIA 12/25/2007  . Essential hypertension 12/25/2007  . NONSPEC ELEVATION OF LEVELS OF TRANSAMINASE/LDH 12/25/2007    Bess Harvest, PTA 12/10/18 6:18 PM   [facility] [address]  [place], [place], [address] Phone: [phone]   Fax:  [phone]  Name: Justin Townsend MRN: [id number] Date of Birth: 1961-10-21

## 2018-12-11 ENCOUNTER — Encounter: Payer: Self-pay | Admitting: Medical

## 2018-12-11 ENCOUNTER — Other Ambulatory Visit: Payer: Self-pay | Admitting: Family Medicine

## 2018-12-12 ENCOUNTER — Ambulatory Visit (INDEPENDENT_AMBULATORY_CARE_PROVIDER_SITE_OTHER): Payer: PRIVATE HEALTH INSURANCE | Admitting: Orthopaedic Surgery

## 2018-12-12 ENCOUNTER — Other Ambulatory Visit: Payer: Self-pay | Admitting: Family Medicine

## 2018-12-12 ENCOUNTER — Encounter (INDEPENDENT_AMBULATORY_CARE_PROVIDER_SITE_OTHER): Payer: Self-pay | Admitting: Orthopaedic Surgery

## 2018-12-12 VITALS — BP 135/80 | HR 70 | Ht 71.0 in | Wt 254.0 lb

## 2018-12-12 DIAGNOSIS — K2100 Gastro-esophageal reflux disease with esophagitis, without bleeding: Secondary | ICD-10-CM

## 2018-12-12 DIAGNOSIS — E782 Mixed hyperlipidemia: Secondary | ICD-10-CM

## 2018-12-12 DIAGNOSIS — S76112D Strain of left quadriceps muscle, fascia and tendon, subsequent encounter: Secondary | ICD-10-CM

## 2018-12-12 MED ORDER — EZETIMIBE 10 MG PO TABS: 10.0000 mg | ORAL_TABLET | Freq: Every day | ORAL | 1 refills | Status: DC

## 2018-12-12 MED ORDER — LISINOPRIL 10 MG PO TABS: 20.0000 mg | ORAL_TABLET | Freq: Every day | ORAL | 3 refills | Status: DC

## 2018-12-12 MED ORDER — OMEPRAZOLE 40 MG PO CPDR: 40.0000 mg | DELAYED_RELEASE_CAPSULE | Freq: Every day | ORAL | 3 refills | Status: DC

## 2018-12-12 MED ORDER — AMLODIPINE BESYLATE 5 MG PO TABS: ORAL_TABLET | ORAL | 3 refills | Status: DC

## 2018-12-12 MED ORDER — IBUPROFEN 200 MG PO TABS: 800.0000 mg | ORAL_TABLET | Freq: Two times a day (BID) | ORAL | 0 refills | Status: DC | PRN

## 2018-12-12 NOTE — Telephone Encounter (Signed)
Copied from CRM [id number]. Topic: Quick Communication - Rx Refill/Question >> Dec 12, 2018  1:25 PM Gloriann Loan L wrote: Medication: amLODipine (NORVASC) 5 MG tablet 90 day supply    ezetimibe (ZETIA) 10 MG tablet    90 day   ibuprofen (ADVIL,MOTRIN) 200 MG tablet 90 day  lisinopril (PRINIVIL,ZESTRIL) 10 MG tablet 60 day directions was changed at his OV to take 2 a day  omeprazole (PRILOSEC) 40 MG capsule 90 day   Has the patient contacted their pharmacy? Yes.   (Agent: If no, request that the patient contact the pharmacy for the refill.) (Agent: If yes, when and what did the pharmacy advise?) called pharmacy today to her to call provider about the lisinopril being changed   Preferred Pharmacy (with phone number or street name): [id number] [place], [place] - [address] [phone] (Phone) [phone] (Fax)    Agent: Please be advised that RX refills may take up to 3 business days. We ask that you follow-up with your pharmacy.

## 2018-12-12 NOTE — Progress Notes (Signed)
Post-Op Visit Note   Patient: Justin Townsend           Date of Birth: 24-Jan-1961           MRN: [id number] Visit Date: 12/12/2018 PCP: Sharlene Dory, DO   Assessment & Plan:  follow-up quad tendon repair.  He is walking without a limp he is not able to go up a single step with his left leg first without hopping.  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  On his job he has a Naval architect Complaint:  Chief Complaint  Patient presents with  . Left Knee - Pain, Follow-up   Visit Diagnoses:  1. Rupture of left quadriceps tendon, subsequent encounter     Plan: Work slip given no work x3 weeks recheck 3 weeks.  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  He will work hard and will see if he is getting close to being ready to resume work on follow-up.  Follow-Up Instructions: No follow-ups on file.   Orders:  No orders of the defined types were placed in this encounter.  No orders of the defined types were placed in this encounter.   Imaging: No results found.  PMFS History: Patient Active Problem List   Diagnosis Date Noted  . Quadriceps tendon rupture 09/19/2018  . Rupture of left quadriceps tendon 09/18/2018  . Generalized anxiety disorder 08/17/2016  . Non-compliant behavior 08/12/2015  . SNORING, HX OF 04/29/2010  . DYSPHAGIA UNSPECIFIED 01/13/2009  . HYPERLIPIDEMIA 12/25/2007  . Essential hypertension 12/25/2007  . NONSPEC ELEVATION OF LEVELS OF TRANSAMINASE/LDH 12/25/2007   Past Medical History:  Diagnosis Date  . Anxiety   . Asthma    only as child; resolved by age 89  . Fatty liver   . Gallstones   . GERD (gastroesophageal reflux disease)   . History of hiatal hernia   . Hyperlipidemia   . Hypertension   . Pneumonia     Family History  Problem Relation Age of Onset  . Ulcers Mother   .  Cirrhosis Mother        ? Alcohol Related   . Cancer Paternal Grandfather        CNS Cancer  . Panic disorder Other        Maternal FH  . Anxiety disorder Other        Maternal FH  . Heart attack Maternal Grandfather        >55  . Diabetes Neg Hx   . Stroke Neg Hx   . Colon cancer Neg Hx   . Colon polyps Neg Hx     Past Surgical History:  Procedure Laterality Date  . no colonoscopy     SOC reviewed  . ORIF TOE FRACTURE Right    foot  . QUADRICEPS TENDON REPAIR Left 09/19/2018   Procedure: REPAIR LEFT KNEE QUADRICEPS TENDON;  Surgeon: Eldred Manges, MD;  Location: [facility];  Service: Orthopedics;  Laterality: Left;  . Thumb Surgery Left    pinning post dislocation   Social History   Occupational History  . Occupation: Air traffic controller: [employer]  Tobacco Use  . Smoking status: Light Tobacco Smoker    Types: Cigars  . Smokeless tobacco: Never Used  . Tobacco comment: rare cigar  Substance and Sexual  Activity  . Alcohol use: Yes    Alcohol/week: 10.0 standard drinks    Types: 10 Cans of beer per week    Comment: Socially on weekend  . Drug use: No  . Sexual activity: Yes    Partners: Female

## 2018-12-12 NOTE — Telephone Encounter (Signed)
Copied from CRM [id number]. Topic: Quick Communication - Rx Refill/Question >> Dec 12, 2018  1:32 PM Gloriann Loan L wrote: Medication: FLUoxetine (PROZAC) 10 MG tablet  90 day refill  Has the patient contacted their pharmacy? Yes.   (Agent: If no, request that the patient contact the pharmacy for the refill.) (Agent: If yes, when and what did the pharmacy advise?) called today told to call provider  Preferred Pharmacy (with phone number or street name): [facility] [address] [place], [place] - [address] [phone] (Phone) [phone] (Fax)    Agent: Please be advised that RX refills may take up to 3 business days. We ask that you follow-up with your pharmacy.

## 2018-12-12 NOTE — Telephone Encounter (Signed)
Spoke with Hailey at [facility] who states that prescription for fluoxetine was received.

## 2018-12-12 NOTE — Telephone Encounter (Signed)
Requested medication (s) are due for refill today -yes  Requested medication (s) are on the active medication list -yes  Future visit scheduled -no  Last refill: historical and outside provider for remaining Rx- patient is due CPE in April.  Notes to clinic: Prilosec and Ibuprofen are historical and outside provider-requested sent to new pharmacy- sent for review   Requested Prescriptions  Pending Prescriptions Disp Refills   ibuprofen (ADVIL,MOTRIN) 200 MG tablet 30 tablet     Sig: Take 4 tablets (800 mg total) by mouth 2 (two) times daily as needed for moderate pain.     Analgesics:  NSAIDS Passed - 12/12/2018  1:48 PM      Passed - Cr in normal range and within 360 days    Creat  Date Value Ref Range Status  06/08/2013 0.84 0.50 - 1.35 mg/dL Final   Creatinine, Ser  Date Value Ref Range Status  11/07/2018 0.85 0.40 - 1.50 mg/dL Final         Passed - HGB in normal range and within 360 days    Hemoglobin  Date Value Ref Range Status  09/19/2018 16.3 13.0 - 17.0 g/dL Final         Passed - Patient is not pregnant      Passed - Valid encounter within last 12 months    Recent Outpatient Visits          3 weeks ago Essential hypertension   Holiday representative at [employer], [place], [place]   1 month ago Essential hypertension   Holiday representative at [employer], [place], [place]   9 months ago Well adult exam   Holiday representative at [employer], [place], [place]   1 year ago Essential hypertension   Holiday representative at [employer], [place], [place]   2 years ago Cellulitis of right lower extremity   Holiday representative at [employer], [place], [place]      Future Appointments            In 4 weeks Eldred Manges, MD [facility]          omeprazole (PRILOSEC) 40 MG capsule 30 capsule     Sig: Take 1 capsule  (40 mg total) by mouth daily.     Gastroenterology: Proton Pump Inhibitors Passed - 12/12/2018  1:48 PM      Passed - Valid encounter within last 12 months    Recent Outpatient Visits          3 weeks ago Essential hypertension   Holiday representative at [employer], [place], [place]   1 month ago Essential hypertension   Holiday representative at [employer], [place], [place]   9 months ago Well adult exam   Holiday representative at [employer], [place], [place]   1 year ago Essential hypertension   Holiday representative at [employer], [place], [place]   2 years ago Cellulitis of right lower extremity   Holiday representative at [employer], [place], [place]      Future Appointments            In 4 weeks Eldred Manges, MD [facility]         Signed Prescriptions Disp Refills   amLODipine (NORVASC) 5 MG tablet  60 tablet 3    Sig: 1-2 tab po q day     Cardiovascular:  Calcium Channel Blockers Passed - 12/12/2018  1:48 PM      Passed - Last BP in normal range    BP Readings from Last 1 Encounters:  12/12/18 135/80         Passed - Valid encounter within last 6 months    Recent Outpatient Visits          3 weeks ago Essential hypertension   [facility] [place] [place], [place]   1 month ago Essential hypertension   [facility] [place], [place], [place]   9 months ago Well adult exam   [facility] [place], [place] Tenea [place], [place]   1 year ago Essential hypertension   [facility] [place], [place]nal[place], [place]   2 years ago Cellulitis of right lower extremity   [facility] [place], [place] Jazzmine [place], [place]      Future Appointments            In 4  weeks Eldred MangesYates, Mark C, MD [facility]          ezetimibe (ZETIA) 10 MG tablet 90 tablet 1    Sig: Take 1 tablet (10 mg total) by mouth daily.     Cardiovascular:  Antilipid - Sterol Transport Inhibitors Passed - 12/12/2018  1:48 PM      Passed - Total Cholesterol in normal range and within 360 days    Cholesterol  Date Value Ref Range Status  03/07/2018 140 0 - 200 mg/dL Final    Comment:    ATP III Classification       Desirable:  < 200 mg/dL               Borderline High:  200 - 239 mg/dL          High:  > = 696240 mg/dL         Passed - LDL in normal range and within 360 days    LDL Cholesterol  Date Value Ref Range Status  03/07/2018 72 0 - 99 mg/dL Final         Passed - HDL in normal range and within 360 days    HDL  Date Value Ref Range Status  03/07/2018 52.00 >39.00 mg/dL Final         Passed - Triglycerides in normal range and within 360 days    Triglycerides  Date Value Ref Range Status  03/07/2018 77.0 0.0 - 149.0 mg/dL Final    Comment:    Normal:  <150 mg/dLBorderline High:  150 - 199 mg/dL         Passed - Valid encounter within last 12 months    Recent Outpatient Visits          3 weeks ago Essential hypertension   [facility] [place], [place], [place]   1 month ago Essential hypertension   [facility] [place], [place], [place]   9 months ago Well adult exam   [facility] [place], [place] Johnatha [place], [place]   1 year ago Essential hypertension   [facility] [place], [place] Alfredia [place], [place]   2 years ago Cellulitis of right lower extremity  Holiday representative at [employer], [place], [place]      Future Appointments            In 4 weeks Eldred Manges, MD [facility]          lisinopril (PRINIVIL,ZESTRIL) 10 MG tablet 60 tablet 3     Sig: Take 2 tablets (20 mg total) by mouth daily.     Cardiovascular:  ACE Inhibitors Passed - 12/12/2018  1:48 PM      Passed - Cr in normal range and within 180 days    Creat  Date Value Ref Range Status  06/08/2013 0.84 0.50 - 1.35 mg/dL Final   Creatinine, Ser  Date Value Ref Range Status  11/07/2018 0.85 0.40 - 1.50 mg/dL Final         Passed - K in normal range and within 180 days    Potassium  Date Value Ref Range Status  11/07/2018 4.0 3.5 - 5.1 mEq/L Final         Passed - Patient is not pregnant      Passed - Last BP in normal range    BP Readings from Last 1 Encounters:  12/12/18 135/80         Passed - Valid encounter within last 6 months    Recent Outpatient Visits          3 weeks ago Essential hypertension   Holiday representative at [employer], [place], [place]   1 month ago Essential hypertension   Holiday representative at [employer], [place], [place]   9 months ago Well adult exam   Holiday representative at [employer], [place], [place]   1 year ago Essential hypertension   Holiday representative at [employer], [place], [place]   2 years ago Cellulitis of right lower extremity   Holiday representative at [employer], [place], [place]      Future Appointments            In 4 weeks Eldred Manges, MD [employer]            Requested Prescriptions  Pending Prescriptions Disp Refills   ibuprofen (ADVIL,MOTRIN) 200 MG tablet 30 tablet     Sig: Take 4 tablets (800 mg total) by mouth 2 (two) times daily as needed for moderate pain.     Analgesics:  NSAIDS Passed - 12/12/2018  1:48 PM      Passed - Cr in normal range and within 360 days    Creat  Date Value Ref Range Status  06/08/2013 0.84 0.50 - 1.35 mg/dL Final   Creatinine, Ser  Date Value Ref Range Status  11/07/2018 0.85 0.40 - 1.50 mg/dL Final          Passed - HGB in normal range and within 360 days    Hemoglobin  Date Value Ref Range Status  09/19/2018 16.3 13.0 - 17.0 g/dL Final         Passed - Patient is not pregnant      Passed - Valid encounter within last 12 months    Recent Outpatient Visits          3 weeks ago Essential hypertension   Holiday representative at [employer], [place], [place]   1 month ago Essential hypertension   Holiday representative at [employer]  [place], [place], [place]   9 months ago Well adult exam   Holiday representative at [employer], [place], [place]   1 year ago Essential hypertension   Holiday representative at [employer], [place], [place]   2 years ago Cellulitis of right lower extremity   Holiday representative at [employer], [place], [place]      Future Appointments            In 4 weeks Eldred Manges, MD [facility]          omeprazole (PRILOSEC) 40 MG capsule 30 capsule     Sig: Take 1 capsule (40 mg total) by mouth daily.     Gastroenterology: Proton Pump Inhibitors Passed - 12/12/2018  1:48 PM      Passed - Valid encounter within last 12 months    Recent Outpatient Visits          3 weeks ago Essential hypertension   Holiday representative at [employer], [place], [place]   1 month ago Essential hypertension   Holiday representative at [employer], [place], [place]   9 months ago Well adult exam   Holiday representative at [employer], [place], [place]   1 year ago Essential hypertension   Holiday representative at [employer], [place], [place]   2 years ago Cellulitis of right lower extremity   Holiday representative at [employer], [place], [place]      Future Appointments            In 4 weeks [facility]anges[facility]         Signed Prescriptions Disp Refills   amLODipine (NORVASC) 5 MG tablet 60 tablet 3    Sig: 1-2 tab po q day     Cardiovascular:  Calcium Channel Blockers Passed - 12/12/2018  1:48 PM      Passed - Last BP in normal range    BP Readings from Last 1 Encounters:  12/12/18 135/80         Passed - Valid encounter within last 6 months    Recent Outpatient Visits          3 weeks ago Essential hypertension   Holiday representative at [employer], [place], [place]   1 month ago Essential hypertension   Holiday representative at [employer], [place], [place]   9 months ago Well adult exam   Holiday representative at [employer], [place], [place]   1 year ago Essential hypertension   Holiday representative at [employer], [place], [place]   2 years ago Cellulitis of right lower extremity   Holiday representative at [employer], [place], [place]      Future Appointments            In 4 weeks Eldred Manges, MD [employer]          ezetimibe (ZETIA) 10 MG tablet 90 tablet 1    Sig: Take 1 tablet (10 mg total) by mouth daily.     Cardiovascular:  Antilipid - Sterol Transport Inhibitors Passed - 12/12/2018  1:48 PM      Passed - Total Cholesterol in normal range and within  360 days    Cholesterol  Date Value Ref Range Status  03/07/2018 140 0 - 200 mg/dL Final    Comment:    ATP III Classification       Desirable:  < 200 mg/dL               Borderline High:  200 - 239 mg/dL          High:  > = 578 mg/dL         Passed - LDL in normal range and within 360 days    LDL Cholesterol  Date Value Ref Range Status  03/07/2018 72 0 - 99 mg/dL Final         Passed - HDL in normal range and within 360 days    HDL  Date Value Ref Range Status  03/07/2018 52.00 >39.00 mg/dL Final         Passed - Triglycerides in  normal range and within 360 days    Triglycerides  Date Value Ref Range Status  03/07/2018 77.0 0.0 - 149.0 mg/dL Final    Comment:    Normal:  <150 mg/dLBorderline High:  150 - 199 mg/dL         Passed - Valid encounter within last 12 months    Recent Outpatient Visits          3 weeks ago Essential hypertension   Holiday representative at [employer], [place], [place]   1 month ago Essential hypertension   Holiday representative at [employer], [place], [place]   9 months ago Well adult exam   Holiday representative at [employer], [place], [place]   1 year ago Essential hypertension   Holiday representative at [employer], [place], [place]   2 years ago Cellulitis of right lower extremity   Holiday representative at [employer], [place], [place]      Future Appointments            In 4 weeks Eldred Manges, MD [employer]          lisinopril (PRINIVIL,ZESTRIL) 10 MG tablet 60 tablet 3    Sig: Take 2 tablets (20 mg total) by mouth daily.     Cardiovascular:  ACE Inhibitors Passed - 12/12/2018  1:48 PM      Passed - Cr in normal range and within 180 days    Creat  Date Value Ref Range Status  06/08/2013 0.84 0.50 - 1.35 mg/dL Final   Creatinine, Ser  Date Value Ref Range Status  11/07/2018 0.85 0.40 - 1.50 mg/dL Final         Passed - K in normal range and within 180 days    Potassium  Date Value Ref Range Status  11/07/2018 4.0 3.5 - 5.1 mEq/L Final         Passed - Patient is not pregnant      Passed - Last BP in normal range    BP Readings from Last 1 Encounters:  12/12/18 135/80         Passed - Valid encounter within last 6 months    Recent Outpatient Visits          3 weeks ago Essential hypertension   Holiday representative at [employer], [place], [place]   1 month ago Essential hypertension    Holiday representative at [employer]  [facility] [place], [place], [place]   9 months ago Well adult exam   [facility] [place], [place] Pierce [place], [place]   1 year ago Essential hypertension   [facility] [place], [place] Francesca [place], [place]   2 years ago Cellulitis of right lower extremity   [facility] [place], [place] Mateus [place], [place]      Future Appointments            In 4 weeks Eldred MangesYates, Mark C, MD [facility]

## 2018-12-12 NOTE — Telephone Encounter (Signed)
Attempted to contact pharmacy to verify receipt of Dr Antoine Poche order for this medication. Pharmacy closed for lunch.

## 2018-12-13 ENCOUNTER — Ambulatory Visit: Payer: PRIVATE HEALTH INSURANCE | Admitting: Physical Therapy

## 2018-12-13 ENCOUNTER — Encounter: Payer: Self-pay | Admitting: Physical Therapy

## 2018-12-13 DIAGNOSIS — M25662 Stiffness of left knee, not elsewhere classified: Secondary | ICD-10-CM

## 2018-12-13 DIAGNOSIS — R262 Difficulty in walking, not elsewhere classified: Secondary | ICD-10-CM

## 2018-12-13 DIAGNOSIS — R2689 Other abnormalities of gait and mobility: Secondary | ICD-10-CM

## 2018-12-13 DIAGNOSIS — M6281 Muscle weakness (generalized): Secondary | ICD-10-CM

## 2018-12-13 NOTE — Therapy (Signed)
[facility] [address]  [place], [place], [address] Phone: [phone]   Fax:  [phone]  Physical Therapy Treatment / Recert  Patient Details  Name: Justin Townsend MRN: [id number] Date of Birth: 1961-05-16 Referring Provider (PT): Rodell Perna, MD   Encounter Date: 12/13/2018  PT End of Session - 12/13/18 0850    Visit Number  13    Number of Visits  21    Date for PT Re-Evaluation  01/10/19    Authorization Type  Prior authorization required (Generic Commercial)    Authorization Time Period  11/01/18 - 12/27/18    Authorization - Visit Number  13    Authorization - Number of Visits  13   12 visits + eval   PT Start Time  6962   pt arrived late   PT Stop Time  0941    PT Time Calculation (min)  51 min    Activity Tolerance  Patient tolerated treatment well    Behavior During Therapy  [facility] for tasks assessed/performed;Impulsive   very distracted      Past Medical History:  Diagnosis Date  . Anxiety   . Asthma    only as child; resolved by age 10  . Fatty liver   . Gallstones   . GERD (gastroesophageal reflux disease)   . History of hiatal hernia   . Hyperlipidemia   . Hypertension   . Pneumonia     Past Surgical History:  Procedure Laterality Date  . no colonoscopy     SOC reviewed  . ORIF TOE FRACTURE Right    foot  . QUADRICEPS TENDON REPAIR Left 09/19/2018   Procedure: REPAIR LEFT KNEE QUADRICEPS TENDON;  Surgeon: Marybelle Killings, MD;  Location: [facility];  Service: Orthopedics;  Laterality: Left;  . Thumb Surgery Left    pinning post dislocation    There were no vitals filed for this visit.  Subjective Assessment - 12/13/18 0855    Subjective  Pt reports he has not been wearing the brace for the past few days because he feels like the brace makes his knee "puff up". Saw MD yesterday - wants him to continue work on strengthening for 3 more weeks then will reassess readiness for return to work.     Pertinent History  L quad tendon repair 09/19/18    Patient Stated Goals  "To get back to work and walk normally."    Currently in Pain?  No/denies         [facility] PT Assessment - 12/13/18 0850      Assessment   Medical Diagnosis  L quad tendon repair    Referring Provider (PT)  Rodell Perna, MD    Onset Date/Surgical Date  09/19/18    Next MD Visit  01/09/19                   [facility] Adult PT Treatment/Exercise - 12/13/18 0850      Exercises   Exercises  Knee/Hip      Knee/Hip Exercises: Aerobic   Recumbent Bike  L2 x 6 min      Knee/Hip Exercises: Standing   Forward Lunges  Left;Right;15 reps;3 seconds    Forward Lunges Limitations  TRX - backward lunges - cues for posture and knee alignment    Side Lunges  Left;Right;15 reps;3 seconds    Side Lunges Limitations  TRX    Terminal Knee Extension  Left;20 reps;Theraband;Strengthening    Theraband  Level (Terminal Knee Extension)  --   Black   Terminal Knee Extension Limitations  cues for quad & glute activation    Lateral Step Up  Left;10 reps;Step Height: 6";Hand Hold: 1    Forward Step Up  Left;10 reps;Step Height: 6";Hand Hold: 1    Step Down  Left;10 reps;Step Height: 4";Hand Hold: 1    Step Down Limitations  eccentric lowering with light R forefoot tap to floor    Functional Squat  15 reps;3 seconds    Functional Squat Limitations  TRX + triple extension; cues for even weight shift    Other Standing Knee Exercises  B side stepping & fwd/back monster walk with looped green TB at ankles 2 x 40 ft      Knee/Hip Exercises: Seated   Other Seated Knee/Hip Exercises  L Fitter leg press (1 black, 2 blue) x 20      Modalities   Modalities  Vasopneumatic      Vasopneumatic   Number Minutes Vasopneumatic   10 minutes    Vasopnuematic Location   Knee   L   Vasopneumatic Pressure  Medium;High    Vasopneumatic Temperature   coldest temp.                 PT Short Term Goals - 11/07/18 1039      PT SHORT TERM  GOAL #1   Title  Independent with initial HEP    Status  Achieved        PT Long Term Goals - 12/13/18 1246      PT LONG TERM GOAL #1   Title  Independent with advance/ongoing HEP    Status  Partially Met    Target Date  01/10/19      PT LONG TERM GOAL #2   Title  L knee AROM >/= 0-125 to allow for normal gait and mobility    Status  Achieved      PT LONG TERM GOAL #3   Title  L hip and knee strength >/= 4+/5 for improved stability    Status  Partially Met    Target Date  01/10/19      PT LONG TERM GOAL #4   Title  Pt will ambulate with normal gait pattern w/o evidence of L quad instability    Status  Partially Met    Target Date  01/10/19      PT LONG TERM GOAL #5   Title  Pt will negotiate stairs with good reciprocal pattern to allow improved access to all levels of home    Status  On-going    Target Date  01/10/19      PT LONG TERM GOAL #6   Title  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 Post-op quad tendon repair protocol allowing pt to initiate forward and lateral step-ups and step-downs as of today - attempted with pt requiring significant cueing to isolate quad  and hip extension when leading with L leg and demonstrating poor eccentric control with lowering. Also increased emphasis on unilateral strengthening requiring more isolated L quad control with pt continuing to require close monitoring and cueing for proper technique avoiding substitution and/or poor mechanics/alignment. Based on ongoing quad weakness and  resultant functional deficits resulting in inability to return to work, recommend recert for additional 2x/wk for up to 4 weeks.    Rehab Potential  Good    PT Frequency  2x / week    PT Duration  4 weeks    PT Treatment/Interventions  Patient/family education;Therapeutic exercise;Therapeutic activities;Neuromuscular re-education;Balance training;ADLs/Self Care Home Management;Manual techniques;Scar mobilization;Dry needling;Taping;Electrical Stimulation;Cryotherapy;Vasopneumatic Device    PT Next Visit Plan  L knee ROM & LE strengthening per protocol    Consulted and Agree with Plan of Care  Patient       Patient will benefit from skilled therapeutic intervention in order to improve the following deficits and impairments:  Decreased range of motion, Impaired flexibility, Decreased strength, Difficulty walking, Abnormal gait, Decreased activity tolerance, Decreased balance, Decreased scar mobility  Visit Diagnosis: Stiffness of left knee, not elsewhere classified  Muscle weakness (generalized)  Difficulty in walking, not elsewhere classified  Other abnormalities of gait and mobility     Problem List Patient Active Problem List   Diagnosis Date Noted  . Quadriceps tendon rupture 09/19/2018  . Rupture of left quadriceps tendon 09/18/2018  . Generalized anxiety disorder 08/17/2016  . Non-compliant behavior 08/12/2015  . SNORING, HX OF 04/29/2010  . DYSPHAGIA UNSPECIFIED 01/13/2009  . HYPERLIPIDEMIA 12/25/2007  . Essential hypertension 12/25/2007  . NONSPEC ELEVATION OF LEVELS OF TRANSAMINASE/LDH 12/25/2007    Percival Spanish, PT, MPT 12/13/2018, 12:48 PM  [facility] [address]  [place], [place], [address] Phone: [phone]   Fax:  [phone]  Name: Justin Townsend MRN: [id number] Date of Birth: July 19, 1961

## 2018-12-17 ENCOUNTER — Ambulatory Visit: Payer: PRIVATE HEALTH INSURANCE

## 2018-12-17 DIAGNOSIS — M25662 Stiffness of left knee, not elsewhere classified: Secondary | ICD-10-CM | POA: Diagnosis not present

## 2018-12-17 DIAGNOSIS — R2689 Other abnormalities of gait and mobility: Secondary | ICD-10-CM

## 2018-12-17 DIAGNOSIS — M6281 Muscle weakness (generalized): Secondary | ICD-10-CM

## 2018-12-17 DIAGNOSIS — R262 Difficulty in walking, not elsewhere classified: Secondary | ICD-10-CM

## 2018-12-17 NOTE — Therapy (Signed)
[facility] [address]  [place], [place], [address] Phone: [phone]   Fax:  [phone]  Physical Therapy Treatment  Patient Details  Name: Justin Townsend MRN: [id number] Date of Birth: December 31, 1960 Referring Provider (PT): Rodell Perna, MD   Encounter Date: 12/17/2018  PT End of Session - 12/17/18 1417    Visit Number  14    Number of Visits  21    Date for PT Re-Evaluation  01/10/19    Authorization Type  Prior authorization required (Generic Commercial)    Authorization Time Period  12/17/18 - 01/18/19    Authorization - Visit Number  1    Authorization - Number of Visits  8   12 visits + eval   PT Start Time  1402    PT Stop Time  1443    PT Time Calculation (min)  41 min    Activity Tolerance  Patient tolerated treatment well    Behavior During Therapy  [facility] for tasks assessed/performed;Impulsive   very distracted      Past Medical History:  Diagnosis Date  . Anxiety   . Asthma    only as child; resolved by age 76  . Fatty liver   . Gallstones   . GERD (gastroesophageal reflux disease)   . History of hiatal hernia   . Hyperlipidemia   . Hypertension   . Pneumonia     Past Surgical History:  Procedure Laterality Date  . no colonoscopy     SOC reviewed  . ORIF TOE FRACTURE Right    foot  . QUADRICEPS TENDON REPAIR Left 09/19/2018   Procedure: REPAIR LEFT KNEE QUADRICEPS TENDON;  Surgeon: Marybelle Killings, MD;  Location: [place];  Service: Orthopedics;  Laterality: Left;  . Thumb Surgery Left    pinning post dislocation    There were no vitals filed for this visit.  Subjective Assessment - 12/17/18 1417    Subjective  Pt. doing well with no new complaints.      Pertinent History  L quad tendon repair 09/19/18    Patient Stated Goals  "To get back to work and walk normally."    Currently in Pain?  No/denies    Multiple Pain Sites  No                       [facility] Adult PT  Treatment/Exercise - 12/17/18 1423      Knee/Hip Exercises: Aerobic   Recumbent Bike  L2 x 6 min      Knee/Hip Exercises: Machines for Strengthening   Cybex Leg Press  L only 20# x 15 reps       Knee/Hip Exercises: Standing   Heel Raises  Both;20 reps    Heel Raises Limitations  + quad set and B hip extension     Forward Lunges  Left;Right;15 reps;3 seconds    Forward Lunges Limitations  TRX - backward lunges - cues for posture and knee alignment    Lateral Step Up  Left;Step Height: 6";Hand Hold: 1;15 reps    Forward Step Up  Left;15 reps    Step Down  Left;15 reps;Step Height: 4";Hand Hold: 2    Step Down Limitations  Heavy cueing near end of set for proper technique and slow pacing     Functional Squat  15 reps;3 seconds    Functional Squat Limitations  TRX + triple extension; cues for even weight shift    SLS  with Vectors  L SLS with red TB pallof press L SLS+ R TTWB                PT Short Term Goals - 11/07/18 1039      PT SHORT TERM GOAL #1   Title  Independent with initial HEP    Status  Achieved        PT Long Term Goals - 12/13/18 1246      PT LONG TERM GOAL #1   Title  Independent with advance/ongoing HEP    Status  Partially Met    Target Date  01/10/19      PT LONG TERM GOAL #2   Title  L knee AROM >/= 0-125 to allow for normal gait and mobility    Status  Achieved      PT LONG TERM GOAL #3   Title  L hip and knee strength >/= 4+/5 for improved stability    Status  Partially Met    Target Date  01/10/19      PT LONG TERM GOAL #4   Title  Pt will ambulate with normal gait pattern w/o evidence of L quad instability    Status  Partially Met    Target Date  01/10/19      PT LONG TERM GOAL #5   Title  Pt will negotiate stairs with good reciprocal pattern to allow improved access to all levels of home    Status  On-going    Target Date  01/10/19      PT LONG TERM GOAL #6   Title  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 denies soreness after last session.  Sessions focusing on functional strengthening with progression of step training for carryover to job related tasks.  Pt. tolerated all activities well in session however required repeated cueing throughout session for proper pacing, wt. shift, and to avoid "vaulting" of R LE with stepping activities.  Pt. declined ice to end session.  Will continue to progress per protocol.      Rehab Potential  Good    PT Frequency  2x / week    PT Duration  4 weeks    PT Treatment/Interventions  Patient/family education;Therapeutic exercise;Therapeutic activities;Neuromuscular re-education;Balance training;ADLs/Self Care Home Management;Manual techniques;Scar mobilization;Dry needling;Taping;Electrical Stimulation;Cryotherapy;Vasopneumatic Device    PT Next Visit Plan  L knee ROM & LE strengthening per protocol    Consulted and Agree with Plan of Care  Patient       Patient will benefit from skilled therapeutic intervention in order to improve the following deficits and impairments:  Decreased range of motion, Impaired flexibility, Decreased strength, Difficulty walking, Abnormal gait, Decreased activity tolerance, Decreased balance, Decreased scar mobility  Visit Diagnosis: Stiffness of left knee, not elsewhere classified  Muscle weakness (generalized)  Difficulty in walking, not elsewhere classified  Other abnormalities of gait and mobility     Problem List Patient Active Problem List   Diagnosis Date Noted  . Quadriceps tendon rupture 09/19/2018  . Rupture of left quadriceps tendon 09/18/2018  . Generalized anxiety disorder 08/17/2016  . Non-compliant behavior 08/12/2015  . SNORING, HX OF 04/29/2010  . DYSPHAGIA UNSPECIFIED 01/13/2009  . HYPERLIPIDEMIA 12/25/2007  . Essential hypertension 12/25/2007  .  NONSPEC ELEVATION OF LEVELS OF TRANSAMINASE/LDH 12/25/2007    Bess Harvest, PTA 12/17/18 6:21 PM   [facility] [address]  [place], [place], [address] Phone: [phone]   Fax:  [phone]  Name: Justin Townsend MRN: [id number] Date of Birth: August 06, 1961

## 2018-12-20 ENCOUNTER — Ambulatory Visit: Payer: PRIVATE HEALTH INSURANCE | Admitting: Physical Therapy

## 2018-12-20 ENCOUNTER — Encounter: Payer: Self-pay | Admitting: Physical Therapy

## 2018-12-20 DIAGNOSIS — M25662 Stiffness of left knee, not elsewhere classified: Secondary | ICD-10-CM

## 2018-12-20 DIAGNOSIS — M6281 Muscle weakness (generalized): Secondary | ICD-10-CM

## 2018-12-20 DIAGNOSIS — R2689 Other abnormalities of gait and mobility: Secondary | ICD-10-CM

## 2018-12-20 DIAGNOSIS — R262 Difficulty in walking, not elsewhere classified: Secondary | ICD-10-CM

## 2018-12-20 NOTE — Therapy (Signed)
[facility] [address]  [place], [place], [address] Phone: [phone]   Fax:  [phone]  Physical Therapy Treatment  Patient Details  Name: Justin Townsend MRN: [id number] Date of Birth: 1961-05-04 Referring Provider (PT): Rodell Perna, MD   Encounter Date: 12/20/2018  PT End of Session - 12/20/18 0850    Visit Number  15    Number of Visits  21    Date for PT Re-Evaluation  01/10/19    Authorization Type  Prior authorization required (Generic Commercial)    Authorization Time Period  12/17/18 - 01/18/19    Authorization - Visit Number  2    Authorization - Number of Visits  8   12 visits + eval   PT Start Time  0850    PT Stop Time  0941    PT Time Calculation (min)  51 min    Activity Tolerance  Patient tolerated treatment well    Behavior During Therapy  [facility] for tasks assessed/performed;Impulsive   very distracted      Past Medical History:  Diagnosis Date  . Anxiety   . Asthma    only as child; resolved by age 64  . Fatty liver   . Gallstones   . GERD (gastroesophageal reflux disease)   . History of hiatal hernia   . Hyperlipidemia   . Hypertension   . Pneumonia     Past Surgical History:  Procedure Laterality Date  . no colonoscopy     SOC reviewed  . ORIF TOE FRACTURE Right    foot  . QUADRICEPS TENDON REPAIR Left 09/19/2018   Procedure: REPAIR LEFT KNEE QUADRICEPS TENDON;  Surgeon: Marybelle Killings, MD;  Location: [facility];  Service: Orthopedics;  Laterality: Left;  . Thumb Surgery Left    pinning post dislocation    There were no vitals filed for this visit.  Subjective Assessment - 12/20/18 0852    Subjective  Pt reports "having a good morning ... one of the better ones so far".    Pertinent History  L quad tendon repair 09/19/18    Patient Stated Goals  "To get back to work and walk normally."    Currently in Pain?  No/denies                       [facility] Adult PT  Treatment/Exercise - 12/20/18 0850      Knee/Hip Exercises: Aerobic   Recumbent Bike  L2 x 6 min      Knee/Hip Exercises: Standing   Terminal Knee Extension  Left;20 reps;Theraband;Strengthening    Theraband Level (Terminal Knee Extension)  --   Black   Terminal Knee Extension Limitations  cues for quad & glute activation with 5 sec hold    Forward Step Up  Left;15 reps;Step Height: 8";Hand Hold: 0    Forward Step Up Limitations  + blue TB TKE    Step Down  Left;10 reps;Step Height: 6";Step Height: 4"    Step Down Limitations  eccentric lowering with light R forefoot tap to floor - fwd from 6" step, lateral from 4" step    Wall Squat  15 reps;5 seconds    Wall Squat Limitations  cues for even weight shift over L LE    Lunge Walking - Round Trips  8 x 10 ft along edge of Stage manager Board  4 minutes    Rocker Board Limitations  inverted BOSU -  lateral and heel/toe weight shifts x 15, minisquat x 10 - 2 pole A with CGA of PT    SLS with Vectors  L SLS on blue foam oval with 3 way clock tap to cones x 10      Modalities   Modalities  Cryotherapy      Cryotherapy   Number Minutes Cryotherapy  10 Minutes    Cryotherapy Location  Knee    Type of Cryotherapy  Ice pack               PT Short Term Goals - 11/07/18 1039      PT SHORT TERM GOAL #1   Title  Independent with initial HEP    Status  Achieved        PT Long Term Goals - 12/13/18 1246      PT LONG TERM GOAL #1   Title  Independent with advance/ongoing HEP    Status  Partially Met    Target Date  01/10/19      PT LONG TERM GOAL #2   Title  L knee AROM >/= 0-125 to allow for normal gait and mobility    Status  Achieved      PT LONG TERM GOAL #3   Title  L hip and knee strength >/= 4+/5 for improved stability    Status  Partially Met    Target Date  01/10/19      PT LONG TERM GOAL #4   Title  Pt will ambulate with normal gait pattern w/o evidence of L quad instability    Status  Partially Met     Target Date  01/10/19      PT LONG TERM GOAL #5   Title  Pt will negotiate stairs with good reciprocal pattern to allow improved access to all levels of home    Status  On-going    Target Date  01/10/19      PT LONG TERM GOAL #6   Title  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 Blue theraband TKE added to step-ups during therapy session to promote increased quad activation but pt continues to require cues and close supervision for good technique. Continued focus on on quad strengthening with eccentric emphasis as well as progression of proprioceptive training on inverted BOSU with close monitoring/supervision for proper technique and CGA for balance on BOSU.    Rehab Potential  Good    PT Frequency  2x / week    PT Duration  4 weeks    PT Treatment/Interventions  Patient/family education;Therapeutic exercise;Therapeutic activities;Neuromuscular re-education;Balance training;ADLs/Self Care Home Management;Manual techniques;Scar mobilization;Dry needling;Taping;Electrical Stimulation;Cryotherapy;Vasopneumatic Device    PT Next Visit Plan  L knee ROM & LE strengthening per protocol    Consulted and Agree with Plan of Care  Patient       Patient will benefit from skilled therapeutic intervention in order to improve the following deficits and impairments:  Decreased range of motion, Impaired flexibility, Decreased strength, Difficulty walking, Abnormal gait, Decreased activity tolerance, Decreased balance, Decreased scar mobility  Visit Diagnosis: Stiffness of left knee, not elsewhere classified  Muscle  weakness (generalized)  Difficulty in walking, not elsewhere classified  Other abnormalities of gait and  mobility     Problem List Patient Active Problem List   Diagnosis Date Noted  . Quadriceps tendon rupture 09/19/2018  . Rupture of left quadriceps tendon 09/18/2018  . Generalized anxiety disorder 08/17/2016  . Non-compliant behavior 08/12/2015  . SNORING, HX OF 04/29/2010  . DYSPHAGIA UNSPECIFIED 01/13/2009  . HYPERLIPIDEMIA 12/25/2007  . Essential hypertension 12/25/2007  . NONSPEC ELEVATION OF LEVELS OF TRANSAMINASE/LDH 12/25/2007    Percival Spanish, PT, MPT 12/20/2018, 12:59 PM  [facility] [address]  [place], [place], [address] Phone: [phone]   Fax:  [phone]  Name: Justin Townsend MRN: [id number] Date of Birth: 12/26/1960

## 2018-12-24 ENCOUNTER — Ambulatory Visit: Payer: PRIVATE HEALTH INSURANCE | Attending: Orthopaedic Surgery

## 2018-12-24 DIAGNOSIS — M25662 Stiffness of left knee, not elsewhere classified: Secondary | ICD-10-CM | POA: Diagnosis not present

## 2018-12-24 DIAGNOSIS — M6281 Muscle weakness (generalized): Secondary | ICD-10-CM

## 2018-12-24 DIAGNOSIS — R2689 Other abnormalities of gait and mobility: Secondary | ICD-10-CM | POA: Insufficient documentation

## 2018-12-24 DIAGNOSIS — R262 Difficulty in walking, not elsewhere classified: Secondary | ICD-10-CM

## 2018-12-24 NOTE — Therapy (Signed)
[facility] [address]  [place], [place], [address] Phone: [phone]   Fax:  [phone]  Physical Therapy Treatment  Patient Details  Name: Justin Townsend MRN: [id number] Date of Birth: [birth year] Referring Provider (PT): Rodell Perna, MD   Encounter Date: 12/24/2018  PT End of Session - 12/24/18 0852    Visit Number  16    Number of Visits  21    Date for PT Re-Evaluation  01/10/19    Authorization Type  Prior authorization required (Generic Commercial)    Authorization Time Period  12/17/18 - 01/18/19    Authorization - Visit Number  3    Authorization - Number of Visits  8   12 visits + eval   PT Start Time  0845    PT Stop Time  0936    PT Time Calculation (min)  51 min    Activity Tolerance  Patient tolerated treatment well    Behavior During Therapy  [facility] for tasks assessed/performed;Impulsive   very distracted      Past Medical History:  Diagnosis Date  . Anxiety   . Asthma    only as child; resolved by age 30  . Fatty liver   . Gallstones   . GERD (gastroesophageal reflux disease)   . History of hiatal hernia   . Hyperlipidemia   . Hypertension   . Pneumonia     Past Surgical History:  Procedure Laterality Date  . no colonoscopy     SOC reviewed  . ORIF TOE FRACTURE Right    foot  . QUADRICEPS TENDON REPAIR Left 09/19/2018   Procedure: REPAIR LEFT KNEE QUADRICEPS TENDON;  Surgeon: Marybelle Killings, MD;  Location: [facility];  Service: Orthopedics;  Laterality: Left;  . Thumb Surgery Left    pinning post dislocation    There were no vitals filed for this visit.  Subjective Assessment - 12/24/18 0847    Subjective  Pt. reporting he has been performing HEP daily with exception of last session.      Pertinent History  L quad tendon repair 09/19/18    Patient Stated Goals  "To get back to work and walk normally."    Currently in Pain?  No/denies    Multiple Pain Sites  No                        [facility] Adult PT Treatment/Exercise - 12/24/18 0902      Knee/Hip Exercises: Stretches   Quad Stretch  Left;60 seconds;1 rep    Quad Stretch Limitations  prone with strap       Knee/Hip Exercises: Aerobic   Recumbent Bike  L3 x 7 min      Knee/Hip Exercises: Standing   Hip Flexion  Right;Left;Stengthening;15 reps;Knee straight    Hip Flexion Limitations  standing on airex pad with red loopd TB at ankles    Hip Abduction  Right;Left;15 reps;Knee straight;Stengthening    Abduction Limitations  standing on airex pad at counter with red looped TB at ankles     Hip Extension  Right;Left;15 reps;Knee straight;Stengthening    Extension Limitations  standing at counter on airex pad with red looped TB at ankles    Forward Step Up  Left;15 reps;Step Height: 8";Hand Hold: 0;2 sets;Step Height: 6"    Forward Step Up Limitations  + blue TB TKE; first set on 6" step - improved control with 6" step  Step Down  Left;Step Height: 4";Step Height: 6";Hand Hold: 1;Hand Hold: 2   x 12 reps    Step Down Limitations  4" step lateral with R heel tap; 6" step forward step-down with cues for slow lowering     Wall Squat  20 reps;5 seconds    Wall Squat Limitations  Cues to prevent holding breath, cues for proper hold times       Knee/Hip Exercises: Seated   Hamstring Curl  Left;10 reps   cues for slow return   Hamstring Limitations  blue TB      Knee/Hip Exercises: Supine   Bridges  Both;20 reps      Vasopneumatic   Number Minutes Vasopneumatic   10 minutes    Vasopnuematic Location   Knee    Vasopneumatic Pressure  Low    Vasopneumatic Temperature   coldest temp.                 PT Short Term Goals - 11/07/18 1039      PT SHORT TERM GOAL #1   Title  Independent with initial HEP    Status  Achieved        PT Long Term Goals - 12/13/18 1246      PT LONG TERM GOAL #1   Title  Independent with advance/ongoing HEP    Status  Partially Met     Target Date  01/10/19      PT LONG TERM GOAL #2   Title  L knee AROM >/= 0-125 to allow for normal gait and mobility    Status  Achieved      PT LONG TERM GOAL #3   Title  L hip and knee strength >/= 4+/5 for improved stability    Status  Partially Met    Target Date  01/10/19      PT LONG TERM GOAL #4   Title  Pt will ambulate with normal gait pattern w/o evidence of L quad instability    Status  Partially Met    Target Date  01/10/19      PT LONG TERM GOAL #5   Title  Pt will negotiate stairs with good reciprocal pattern to allow improved access to all levels of home    Status  On-going    Target Date  01/10/19      PT LONG TERM GOAL #6   Title  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  Tolerated all quad strengthening and LE focused strengthening activities in session well today.  Still requiring frequent cueing for proper pacing and to avoid holding breath with exerting activities.  Does seem to be demonstrating improved quad stability and less R LE "vaulting" with step-up activities in session.  Ended visit with ice/compression to L knee to reduce post-exercise soreness and swelling.  Progressing well per protocol.      Rehab Potential  Good    PT Treatment/Interventions  Patient/family education;Therapeutic exercise;Therapeutic activities;Neuromuscular re-education;Balance training;ADLs/Self Care Home Management;Manual techniques;Scar mobilization;Dry needling;Taping;Electrical Stimulation;Cryotherapy;Vasopneumatic Device    PT Next Visit Plan  L knee ROM & LE strengthening per protocol    Consulted and Agree with Plan of Care  Patient  Patient will benefit from skilled therapeutic intervention in order to improve the following deficits and  impairments:  Decreased range of motion, Impaired flexibility, Decreased strength, Difficulty walking, Abnormal gait, Decreased activity tolerance, Decreased balance, Decreased scar mobility  Visit Diagnosis: Stiffness of left knee, not elsewhere classified  Muscle weakness (generalized)  Difficulty in walking, not elsewhere classified  Other abnormalities of gait and mobility     Problem List Patient Active Problem List   Diagnosis Date Noted  . Quadriceps tendon rupture 09/19/2018  . Rupture of left quadriceps tendon 09/18/2018  . Generalized anxiety disorder 08/17/2016  . Non-compliant behavior 08/12/2015  . SNORING, HX OF 04/29/2010  . DYSPHAGIA UNSPECIFIED 01/13/2009  . HYPERLIPIDEMIA 12/25/2007  . Essential hypertension 12/25/2007  . NONSPEC ELEVATION OF LEVELS OF TRANSAMINASE/LDH 12/25/2007    Bess Harvest, PTA 12/24/18 12:15 PM   [facility] [address]  [place], [place], [address] Phone: [phone]   Fax:  [phone]  Name: Justin Townsend MRN: [id number] Date of Birth: [birth year]

## 2018-12-25 ENCOUNTER — Encounter: Payer: Self-pay | Admitting: Internal Medicine

## 2018-12-26 ENCOUNTER — Encounter: Payer: Self-pay | Admitting: Physical Therapy

## 2018-12-26 ENCOUNTER — Encounter: Payer: Self-pay | Admitting: Medical

## 2018-12-27 ENCOUNTER — Encounter: Payer: Self-pay | Admitting: Family Medicine

## 2018-12-27 ENCOUNTER — Ambulatory Visit (INDEPENDENT_AMBULATORY_CARE_PROVIDER_SITE_OTHER): Payer: PRIVATE HEALTH INSURANCE | Admitting: Family Medicine

## 2018-12-27 ENCOUNTER — Ambulatory Visit: Payer: PRIVATE HEALTH INSURANCE | Admitting: Physical Therapy

## 2018-12-27 VITALS — BP 140/80 | HR 96 | Temp 101.0°F | Ht 71.0 in | Wt 257.0 lb

## 2018-12-27 DIAGNOSIS — R6889 Other general symptoms and signs: Secondary | ICD-10-CM

## 2018-12-27 MED ORDER — OSELTAMIVIR PHOSPHATE 75 MG PO CAPS: 75.0000 mg | ORAL_CAPSULE | Freq: Two times a day (BID) | ORAL | 0 refills | Status: AC

## 2018-12-27 MED ORDER — IBUPROFEN 800 MG PO TABS: 800.0000 mg | ORAL_TABLET | Freq: Three times a day (TID) | ORAL | 0 refills | Status: DC | PRN

## 2018-12-27 NOTE — Progress Notes (Signed)
Pre visit review using our clinic review tool, if applicable. No additional management support is needed unless otherwise documented below in the visit note. 

## 2018-12-27 NOTE — Patient Instructions (Signed)
Continue to push fluids, practice good hand hygiene, and cover your mouth if you cough.  If you start having worsening fevers, shaking or shortness of breath, seek immediate care.  For symptoms, consider using Vick's VapoRub on chest or under nose, air humidifier, Benadryl at night, and elevating the head of the bed. Tylenol and ibuprofen for aches and pains you may be experiencing.   Ibuprofen every 8 hours as needed.  Let us know if you need anything.

## 2018-12-27 NOTE — Progress Notes (Signed)
Chief Complaint  Patient presents with  . Cough  . Fever    Yolanda Bonine here for URI complaints.  Duration: 2 days  Associated symptoms: Fever (102 F), chest tightness and cough Denies: sinus congestion, sinus pain, rhinorrhea, itchy watery eyes, ear pain, ear drainage, sore throat, wheezing, shortness of breath and myalgia Treatment to date: Mulberry juice, ibuprofen Sick contacts: Yes  ROS:  Const: + fevers HEENT: As noted in HPI Lungs: +cough  Past Medical History:  Diagnosis Date  . Anxiety   . Asthma    only as child; resolved by age 58  . Fatty liver   . Gallstones   . GERD (gastroesophageal reflux disease)   . History of hiatal hernia   . Hyperlipidemia   . Hypertension     BP 140/80 (BP Location: Left Arm, Patient Position: Sitting, Cuff Size: Large)   Pulse 96   Temp (!) 101 F (38.3 C) (Oral)   Ht 5\' 11"  (1.803 m)   Wt 257 lb (116.6 kg)   SpO2 95%   BMI 35.84 kg/m  General: Awake, alert, appears stated age HEENT: AT, [place], ears patent b/l and TM's neg, nares patent w/o discharge, pharynx pink and without exudates, MMM Neck: No masses or asymmetry Heart: RRR Lungs: CTAB, no accessory muscle use Psych: Age appropriate judgment and insight, normal mood and affect  Flu-like symptoms - Plan: ibuprofen (ADVIL,MOTRIN) 800 MG tablet, oseltamivir (TAMIFLU) 75 MG capsule  Orders as above. Will tx.  Continue to push fluids, practice good hand hygiene, cover mouth when coughing. F/u prn. If starting to experience increasing fevers, shaking, or shortness of breath, seek immediate care. Pt voiced understanding and agreement to the plan.  Jilda Roche [place], DO 12/27/18 11:30 AM

## 2019-01-01 ENCOUNTER — Ambulatory Visit: Payer: PRIVATE HEALTH INSURANCE

## 2019-01-01 DIAGNOSIS — R262 Difficulty in walking, not elsewhere classified: Secondary | ICD-10-CM

## 2019-01-01 DIAGNOSIS — M6281 Muscle weakness (generalized): Secondary | ICD-10-CM

## 2019-01-01 DIAGNOSIS — M25662 Stiffness of left knee, not elsewhere classified: Secondary | ICD-10-CM | POA: Diagnosis not present

## 2019-01-01 DIAGNOSIS — R2689 Other abnormalities of gait and mobility: Secondary | ICD-10-CM

## 2019-01-01 NOTE — Therapy (Signed)
[facility] [address]  [place], [place], [address] Phone: [phone]   Fax:  [phone]  Physical Therapy Treatment  Patient Details  Name: Justin Townsend MRN: [id number] Date of Birth: Oct 10, 1961 Referring Provider (PT): Rodell Perna, MD   Encounter Date: 01/01/2019  PT End of Session - 01/01/19 0944    Visit Number  17    Number of Visits  21    Date for PT Re-Evaluation  01/10/19    Authorization Type  Prior authorization required (Generic Commercial)    Authorization Time Period  12/17/18 - 01/18/19    Authorization - Visit Number  4    Authorization - Number of Visits  8   12 visits + eval   PT Start Time  0931    PT Stop Time  1014    PT Time Calculation (min)  43 min    Activity Tolerance  Patient tolerated treatment well    Behavior During Therapy  [facility] for tasks assessed/performed;Impulsive   very distracted      Past Medical History:  Diagnosis Date  . Anxiety   . Asthma    only as child; resolved by age 89  . Fatty liver   . Gallstones   . GERD (gastroesophageal reflux disease)   . History of hiatal hernia   . Hyperlipidemia   . Hypertension     Past Surgical History:  Procedure Laterality Date  . no colonoscopy     SOC reviewed  . ORIF TOE FRACTURE Right    foot  . QUADRICEPS TENDON REPAIR Left 09/19/2018   Procedure: REPAIR LEFT KNEE QUADRICEPS TENDON;  Surgeon: Marybelle Killings, MD;  Location: [facility];  Service: Orthopedics;  Laterality: Left;  . Thumb Surgery Left    pinning post dislocation    There were no vitals filed for this visit.  Subjective Assessment - 01/01/19 0940    Subjective  Pt. reporting he has been able to only perform HEP three days since last visit due to being sick.      Pertinent History  L quad tendon repair 09/19/18    Patient Stated Goals  "To get back to work and walk normally."    Currently in Pain?  No/denies    Multiple Pain Sites  No                        [facility] Adult PT Treatment/Exercise - 01/01/19 0947      Knee/Hip Exercises: Aerobic   Recumbent Bike  L3 x 7 min      Knee/Hip Exercises: Machines for Strengthening   Cybex Knee Extension  B LE's 20# x 10 reps     Cybex Knee Flexion  B con/L ecc 20# x 15 reps     Cybex Leg Press  L only 25# x 15 reps       Knee/Hip Exercises: Standing   Forward Lunges  Left;Right;15 reps;3 seconds    Forward Lunges Limitations  TRX - backward lunges - cues for posture and knee alignment    Forward Step Up  Left;10 reps;Step Height: 8";Hand Hold: 1;1 set    Forward Step Up Limitations  Cues not to vault from R LE    Wall Squat  20 reps;5 seconds    Wall Squat Limitations  Cues for proper hold times    SLS with Vectors  L SLS "clocks" 3,6,9,12 2 x 20 sec  PT Education - 01/01/19 1026    Education Details  HEP update; wall squat, forward step up (instructed to focus on no R LE vaulting with 6-8" step)    Person(s) Educated  Patient    Methods  Explanation;Demonstration;Verbal cues;Handout    Comprehension  Verbalized understanding;Returned demonstration;Verbal cues required;Need further instruction       PT Short Term Goals - 11/07/18 1039      PT SHORT TERM GOAL #1   Title  Independent with initial HEP    Status  Achieved        PT Long Term Goals - 12/13/18 1246      PT LONG TERM GOAL #1   Title  Independent with advance/ongoing HEP    Status  Partially Met    Target Date  01/10/19      PT LONG TERM GOAL #2   Title  L knee AROM >/= 0-125 to allow for normal gait and mobility    Status  Achieved      PT LONG TERM GOAL #3   Title  L hip and knee strength >/= 4+/5 for improved stability    Status  Partially Met    Target Date  01/10/19      PT LONG TERM GOAL #4   Title  Pt will ambulate with normal gait pattern w/o evidence of L quad instability    Status  Partially Met    Target Date  01/10/19      PT LONG TERM GOAL #5    Title  Pt will negotiate stairs with good reciprocal pattern to allow improved access to all levels of home    Status  On-going    Target Date  01/10/19      PT LONG TERM GOAL #6   Title  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 reporting he was only able to perform HEP three days since last session due to sickness.  Able to tolerate addition of L SLS "clocks" well today.  Session focused on step training with focus on L quad eccentric control.  Pt. with visible improvement in L step-ups with less vaulting off R LE noted.  Progressing well toward goals.      Rehab Potential  Good    PT Treatment/Interventions  Patient/family education;Therapeutic exercise;Therapeutic activities;Neuromuscular re-education;Balance training;ADLs/Self Care Home Management;Manual techniques;Scar mobilization;Dry needling;Taping;Electrical Stimulation;Cryotherapy;Vasopneumatic Device    PT Next Visit Plan  L knee ROM & LE strengthening per protocol    Consulted and Agree with Plan of Care  Patient       Patient will benefit from skilled therapeutic intervention in order to improve the following deficits and impairments:  Decreased range of motion, Impaired flexibility, Decreased strength, Difficulty walking, Abnormal gait, Decreased activity tolerance, Decreased balance, Decreased scar mobility  Visit Diagnosis: Stiffness of left knee, not elsewhere classified  Muscle weakness (generalized)  Difficulty in walking, not elsewhere classified  Other abnormalities of gait and mobility     Problem List Patient Active Problem List   Diagnosis Date Noted  . Quadriceps tendon rupture 09/19/2018  . Rupture of left quadriceps tendon 09/18/2018  . Generalized anxiety disorder 08/17/2016  . Non-compliant behavior 08/12/2015  . SNORING, HX OF  04/29/2010  . DYSPHAGIA UNSPECIFIED  01/13/2009  . HYPERLIPIDEMIA 12/25/2007  . Essential hypertension 12/25/2007  . NONSPEC ELEVATION OF LEVELS OF TRANSAMINASE/LDH 12/25/2007    Bess Harvest, PTA 01/01/19 1:22 PM   [facility] [address]  [place], [place], [address] Phone: [phone]   Fax:  [phone]  Name: Justin Townsend MRN: [id number] Date of Birth: 03-12-1961

## 2019-01-03 ENCOUNTER — Ambulatory Visit: Payer: PRIVATE HEALTH INSURANCE

## 2019-01-03 DIAGNOSIS — M6281 Muscle weakness (generalized): Secondary | ICD-10-CM

## 2019-01-03 DIAGNOSIS — R2689 Other abnormalities of gait and mobility: Secondary | ICD-10-CM

## 2019-01-03 DIAGNOSIS — R262 Difficulty in walking, not elsewhere classified: Secondary | ICD-10-CM

## 2019-01-03 DIAGNOSIS — M25662 Stiffness of left knee, not elsewhere classified: Secondary | ICD-10-CM | POA: Diagnosis not present

## 2019-01-03 NOTE — Therapy (Signed)
[facility] [address]  [place], [place], [address] Phone: [phone]   Fax:  [phone]  Physical Therapy Treatment  Patient Details  Name: Justin Townsend MRN: [id number] Date of Birth: 03/20/1961 Referring Provider (PT): Rodell Perna, MD   Encounter Date: 01/03/2019  PT End of Session - 01/03/19 1413    Visit Number  18    Number of Visits  21    Date for PT Re-Evaluation  01/10/19    Authorization Type  Prior authorization required (Generic Commercial)    Authorization Time Period  12/17/18 - 01/18/19    Authorization - Visit Number  5    Authorization - Number of Visits  8   12 visits + eval   PT Start Time  1400    PT Stop Time  1440    PT Time Calculation (min)  40 min    Activity Tolerance  Patient tolerated treatment well    Behavior During Therapy  [facility] for tasks assessed/performed;Impulsive   very distracted      Past Medical History:  Diagnosis Date  . Anxiety   . Asthma    only as child; resolved by age 26  . Fatty liver   . Gallstones   . GERD (gastroesophageal reflux disease)   . History of hiatal hernia   . Hyperlipidemia   . Hypertension     Past Surgical History:  Procedure Laterality Date  . no colonoscopy     SOC reviewed  . ORIF TOE FRACTURE Right    foot  . QUADRICEPS TENDON REPAIR Left 09/19/2018   Procedure: REPAIR LEFT KNEE QUADRICEPS TENDON;  Surgeon: Marybelle Killings, MD;  Location: [facility];  Service: Orthopedics;  Laterality: Left;  . Thumb Surgery Left    pinning post dislocation    There were no vitals filed for this visit.  Subjective Assessment - 01/03/19 1411    Subjective  Pt. reporting he visited work and tried step-up into truck x 3 and reports he was able to perform however reports difficulty.  Denies soreness today.      Pertinent History  L quad tendon repair 09/19/18    Patient Stated Goals  "To get back to work and walk normally."    Currently in Pain?   No/denies    Multiple Pain Sites  No                       [facility] Adult PT Treatment/Exercise - 01/03/19 1415      Knee/Hip Exercises: Stretches   Quad Stretch  Left;60 seconds;1 rep    Sports administrator Limitations  prone with strap       Knee/Hip Exercises: Aerobic   Recumbent Bike  L3 x 7 min      Knee/Hip Exercises: Machines for Strengthening   Cybex Leg Press  L only 30# x 15 reps       Knee/Hip Exercises: Standing   Forward Lunges  Left;Right;15 reps;3 seconds    Forward Lunges Limitations  TRX - backward lunges - cues for posture and knee alignment    Lateral Step Up  Left;15 reps;Step Height: 8";Hand Hold: 2    Lateral Step Up Limitations  light UE support at wall and cues to avoid "vaulting" off R LE - improved stability     Forward Step Up  Left;15 reps;Step Height: 8";Hand Hold: 0;2 sets    Forward Step Up Limitations  black TB TKE closed  in door     Step Down  Left;10 reps;Hand Hold: 2;Step Height: 6"    Step Down Limitations  lateral step-down with cues for foot flat and paused at bottom due to pt. unable to control heel tap     Wall Squat  20 reps;5 seconds    Wall Squat Limitations  Cues for proper hold times    SLS  eyes closed L SLS at wall 3 x 10 sec with intermittent UE touch for balance     Other Standing Knee Exercises  Side stepping with green TB at ankles 2 x 25 ft       Knee/Hip Exercises: Supine   Other Supine Knee/Hip Exercises  Bridge + HS curl x 10 reps                PT Short Term Goals - 11/07/18 1039      PT SHORT TERM GOAL #1   Title  Independent with initial HEP    Status  Achieved        PT Long Term Goals - 12/13/18 1246      PT LONG TERM GOAL #1   Title  Independent with advance/ongoing HEP    Status  Partially Met    Target Date  01/10/19      PT LONG TERM GOAL #2   Title  L knee AROM >/= 0-125 to allow for normal gait and mobility    Status  Achieved      PT LONG TERM GOAL #3   Title  L hip and knee  strength >/= 4+/5 for improved stability    Status  Partially Met    Target Date  01/10/19      PT LONG TERM GOAL #4   Title  Pt will ambulate with normal gait pattern w/o evidence of L quad instability    Status  Partially Met    Target Date  01/10/19      PT LONG TERM GOAL #5   Title  Pt will negotiate stairs with good reciprocal pattern to allow improved access to all levels of home    Status  On-going    Target Date  01/10/19      PT LONG TERM GOAL #6   Title  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  Denies soreness today to start session.  Focused session on progression of stepping activities and focused quad strengthening.  Pt. tolerated all activities in session without knee pain however continues to require cueing to prevent pt. from holding breath during exertion and for proper pacing to ensure muscular activation.  Pt. demonstrating improved control with 8" step-ups today with nearly no "vaulting" off R LE progressing toward LTG #5, #6.      Rehab Potential  Good    PT Treatment/Interventions  Patient/family education;Therapeutic exercise;Therapeutic activities;Neuromuscular re-education;Balance training;ADLs/Self Care Home Management;Manual techniques;Scar mobilization;Dry needling;Taping;Electrical Stimulation;Cryotherapy;Vasopneumatic Device    PT Next Visit Plan  L knee ROM & LE strengthening per protocol       Patient will benefit from skilled therapeutic intervention in order to improve the following  deficits and impairments:  Decreased range of motion, Impaired flexibility, Decreased strength, Difficulty walking, Abnormal gait, Decreased activity tolerance, Decreased balance,  Decreased scar mobility  Visit Diagnosis: Stiffness of left knee, not elsewhere classified  Muscle weakness (generalized)  Difficulty in walking, not elsewhere classified  Other abnormalities of gait and mobility     Problem List Patient Active Problem List   Diagnosis Date Noted  . Quadriceps tendon rupture 09/19/2018  . Rupture of left quadriceps tendon 09/18/2018  . Generalized anxiety disorder 08/17/2016  . Non-compliant behavior 08/12/2015  . SNORING, HX OF 04/29/2010  . DYSPHAGIA UNSPECIFIED 01/13/2009  . HYPERLIPIDEMIA 12/25/2007  . Essential hypertension 12/25/2007  . NONSPEC ELEVATION OF LEVELS OF TRANSAMINASE/LDH 12/25/2007    Bess Harvest, PTA 01/03/19 4:49 PM    [facility] [address]  [place], [place], [address] Phone: [phone]   Fax:  [phone]  Name: Justin Townsend MRN: [id number] Date of Birth: Aug 14, 1961

## 2019-01-07 ENCOUNTER — Encounter: Payer: Self-pay | Admitting: Physical Therapy

## 2019-01-07 ENCOUNTER — Ambulatory Visit: Payer: PRIVATE HEALTH INSURANCE | Admitting: Physical Therapy

## 2019-01-07 DIAGNOSIS — M6281 Muscle weakness (generalized): Secondary | ICD-10-CM

## 2019-01-07 DIAGNOSIS — R262 Difficulty in walking, not elsewhere classified: Secondary | ICD-10-CM

## 2019-01-07 DIAGNOSIS — M25662 Stiffness of left knee, not elsewhere classified: Secondary | ICD-10-CM

## 2019-01-07 DIAGNOSIS — R2689 Other abnormalities of gait and mobility: Secondary | ICD-10-CM

## 2019-01-07 NOTE — Therapy (Signed)
[facility] [address]  [place], [place], [address] Phone: [phone]   Fax:  [phone]  Physical Therapy Treatment  Patient Details  Name: Justin Townsend MRN: [id number] Date of Birth: 12-19-1960 Referring Provider (PT): Rodell Perna, MD   Encounter Date: 01/07/2019  PT End of Session - 01/07/19 0845    Visit Number  19    Number of Visits  21    Date for PT Re-Evaluation  01/10/19    Authorization Type  Prior authorization required (Generic Commercial)    Authorization Time Period  12/17/18 - 01/18/19    Authorization - Visit Number  6    Authorization - Number of Visits  8    PT Start Time  0845    PT Stop Time  0929    PT Time Calculation (min)  44 min    Activity Tolerance  Patient tolerated treatment well    Behavior During Therapy  [facility] for tasks assessed/performed;Impulsive       Past Medical History:  Diagnosis Date  . Anxiety   . Asthma    only as child; resolved by age 53  . Fatty liver   . Gallstones   . GERD (gastroesophageal reflux disease)   . History of hiatal hernia   . Hyperlipidemia   . Hypertension     Past Surgical History:  Procedure Laterality Date  . no colonoscopy     SOC reviewed  . ORIF TOE FRACTURE Right    foot  . QUADRICEPS TENDON REPAIR Left 09/19/2018   Procedure: REPAIR LEFT KNEE QUADRICEPS TENDON;  Surgeon: Marybelle Killings, MD;  Location: [facility];  Service: Orthopedics;  Laterality: Left;  . Thumb Surgery Left    pinning post dislocation    There were no vitals filed for this visit.  Subjective Assessment - 01/07/19 0845    Subjective  Pt doing well - no issues since last visit.    Pertinent History  L quad tendon repair 09/19/18    Patient Stated Goals  "To get back to work and walk normally."    Currently in Pain?  No/denies         [facility] PT Assessment - 01/07/19 0845      Assessment   Medical Diagnosis  L quad tendon repair    Referring Provider (PT)   Rodell Perna, MD    Onset Date/Surgical Date  09/19/18    Next MD Visit  01/09/19      AROM   Left Knee Extension  0   no quad lag with SLR   Left Knee Flexion  131      Strength   Right Hip Flexion  5/5    Right Hip Extension  5/5    Right Hip ABduction  5/5    Right Hip ADduction  5/5    Left Hip Flexion  5/5    Left Hip Extension  5/5    Left Hip ABduction  5/5    Left Hip ADduction  5/5    Right Knee Flexion  5/5    Right Knee Extension  5/5    Left Knee Flexion  4+/5    Left Knee Extension  4+/5                   [facility] Adult PT Treatment/Exercise - 01/07/19 0845      Ambulation/Gait   Ambulation/Gait Assistance  7: Independent    Ambulation Distance (Feet)  250  Feet    Assistive device  None    Gait Pattern  Step-through pattern;Within Functional Limits    Ambulation Surface  Level;Indoor    Stairs  Yes    Stairs Assistance  7: Independent;6: Modified independent (Device/Increase time)    Stair Management Technique  One rail Right;Alternating pattern;Forwards    Number of Stairs  14   x 2    Height of Stairs  7    Gait Comments  Good reciprocal pattern on stair ascent & descent with slight hesistation noted on first descending step with L LE lowering.      Exercises   Exercises  Knee/Hip      Knee/Hip Exercises: Aerobic   Elliptical  L2.5 x 5 min      Knee/Hip Exercises: Standing   Hip Abduction  Right;Left;15 reps;Knee bent;Stengthening    Abduction Limitations  Fitter (1 black/1 blue)    Hip Extension  Right;Left;15 reps;Knee bent;Stengthening    Extension Limitations  Fitter (1 black/1 blue)    Lateral Step Up  Right;Left;15 reps;Hand Hold: 1   9" step   Lateral Step Up Limitations  + opp LE hip adduction with red TB    Forward Step Up  Right;Left;15 reps;Hand Hold: 1   9" step   Forward Step Up Limitations  + opp LE high knee  drive + knee extension with red TB    Step Down  Left;15 reps;2 sets;Step Height: 6";Hand Hold: 2   2 sets each    Step Down Limitations  forward & lateral eccentric lowering with light R heel tap to floor - cues to slow pace and avoid knee forward of toes    Functional Squat  20 reps;3 seconds    Functional Squat Limitations  TRX + triple extension; cues for even weight shift    Other Standing Knee Exercises  R/L 9" cross-over step-up + opp LE hip abduction with red TB x 15               PT Short Term Goals - 11/07/18 1039      PT SHORT TERM GOAL #1   Title  Independent with initial HEP    Status  Achieved        PT Long Term Goals - 01/07/19 0903      PT LONG TERM GOAL #1   Title  Independent with advanced/ongoing HEP    Status  Partially Met      PT LONG TERM GOAL #2   Title  L knee AROM >/= 0-125 to allow for normal gait and mobility    Status  Achieved      PT LONG TERM GOAL #3   Title  L hip and knee strength >/= 4+/5 for improved stability    Status  Achieved      PT LONG TERM GOAL #4   Title  Pt will ambulate with normal gait pattern w/o evidence of L quad instability    Status  Achieved      PT LONG TERM GOAL #5   Title  Pt will negotiate stairs with good reciprocal pattern to allow improved access to all levels of home    Status  Achieved      PT LONG TERM GOAL #6   Title  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 He demonstrates a normal gait pattern with no evidence of quad instability and is now able to navigate stairs reciprocally, but demonstrates slight hesistation with initating first step on descent with L LE lowering but then can complete full flight of stairs with good reciprocal  step pattern. 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 ROM, strength, gait & stair goals met, with remaining goals partially met. Pt hopeful to return to work soon, but would like to continue PT of work schedule allows.    Rehab Potential  Good    PT Treatment/Interventions  Patient/family education;Therapeutic exercise;Therapeutic activities;Neuromuscular re-education;Balance training;ADLs/Self Care Home Management;Manual techniques;Scar mobilization;Dry needling;Taping;Electrical Stimulation;Cryotherapy;Vasopneumatic Device    PT Next Visit Plan  L knee ROM & LE strengthening per protocol    Consulted and Agree with Plan of Care  Patient       Patient will benefit from skilled therapeutic intervention in order to improve the following deficits and impairments:  Decreased range of motion, Impaired flexibility, Decreased strength, Difficulty walking, Abnormal gait, Decreased activity tolerance, Decreased balance, Decreased scar mobility  Visit Diagnosis: Stiffness of left knee, not elsewhere classified  Muscle weakness (generalized)  Difficulty in walking, not elsewhere classified  Other abnormalities of gait and mobility     Problem List Patient Active Problem List   Diagnosis Date Noted  . Quadriceps tendon rupture 09/19/2018  . Rupture of left quadriceps tendon 09/18/2018  . Generalized anxiety disorder 08/17/2016  . Non-compliant behavior 08/12/2015  . SNORING, HX OF 04/29/2010  . DYSPHAGIA UNSPECIFIED 01/13/2009  . HYPERLIPIDEMIA 12/25/2007  . Essential hypertension 12/25/2007  . NONSPEC ELEVATION OF LEVELS OF TRANSAMINASE/LDH 12/25/2007    Percival Spanish, PT, MPT 01/07/2019, 11:33 AM  [facility] [address]  [place], [place], [address] Phone: [phone]   Fax:  [phone]  Name: Justin Townsend MRN: [id number] Date of  Birth: [birth year]

## 2019-01-09 ENCOUNTER — Encounter (INDEPENDENT_AMBULATORY_CARE_PROVIDER_SITE_OTHER): Payer: Self-pay | Admitting: Orthopaedic Surgery

## 2019-01-09 ENCOUNTER — Ambulatory Visit (INDEPENDENT_AMBULATORY_CARE_PROVIDER_SITE_OTHER): Payer: PRIVATE HEALTH INSURANCE | Admitting: Orthopaedic Surgery

## 2019-01-09 ENCOUNTER — Ambulatory Visit: Payer: PRIVATE HEALTH INSURANCE

## 2019-01-09 VITALS — BP 138/90 | HR 72 | Ht 71.0 in | Wt 257.0 lb

## 2019-01-09 DIAGNOSIS — M6281 Muscle weakness (generalized): Secondary | ICD-10-CM

## 2019-01-09 DIAGNOSIS — M25662 Stiffness of left knee, not elsewhere classified: Secondary | ICD-10-CM | POA: Diagnosis not present

## 2019-01-09 DIAGNOSIS — S76112S Strain of left quadriceps muscle, fascia and tendon, sequela: Secondary | ICD-10-CM | POA: Diagnosis not present

## 2019-01-09 DIAGNOSIS — R262 Difficulty in walking, not elsewhere classified: Secondary | ICD-10-CM

## 2019-01-09 DIAGNOSIS — R2689 Other abnormalities of gait and mobility: Secondary | ICD-10-CM

## 2019-01-09 NOTE — Therapy (Signed)
[facility] [place] [address]  [address] [place], [place], [address] Phone: [phone]   Fax:  [phone]  Physical Therapy Treatment / Discharge Summary  Patient Details  Name: Justin Townsend MRN: [id number] Date of Birth: 11/14/1961 Referring Provider (PT): Mark Yates, MD   Encounter Date: 01/09/2019  PT End of Session - 01/09/19 1415    Visit Number  20    Number of Visits  21    Date for PT Re-Evaluation  01/10/19    Authorization Type  Prior authorization required (Generic Commercial)    Authorization Time Period  12/17/18 - 01/18/19    Authorization - Visit Number  7    Authorization - Number of Visits  8    PT Start Time  1400    PT Stop Time  1441    PT Time Calculation (min)  41 min    Activity Tolerance  Patient tolerated treatment well    Behavior During Therapy  WFL for tasks assessed/performed;Impulsive       Past Medical History:  Diagnosis Date  . Anxiety   . Asthma    only as child; resolved by age 6  . Fatty liver   . Gallstones   . GERD (gastroesophageal reflux disease)   . History of hiatal hernia   . Hyperlipidemia   . Hypertension     Past Surgical History:  Procedure Laterality Date  . no colonoscopy     SOC reviewed  . ORIF TOE FRACTURE Right    foot  . QUADRICEPS TENDON REPAIR Left 09/19/2018   Procedure: REPAIR LEFT KNEE QUADRICEPS TENDON;  Surgeon: Yates, Mark C, MD;  Location: [facility] OR;  Service: Orthopedics;  Laterality: Left;  . Thumb Surgery Left    pinning post dislocation    There were no vitals filed for this visit.  Subjective Assessment - 01/09/19 1414    Subjective  Pt. reporting MD told him to "use work for your therapy".  Reports MD wants him to return to work tomorrow and finish with therapy.      Pertinent History  L quad tendon repair 09/19/18    Patient Stated Goals  "To get back to work and walk normally."    Currently in Pain?  No/denies    Multiple Pain Sites  No         [facility] PT Assessment - 01/09/19 0001      Observation/Other Assessments   Focus on Therapeutic Outcomes (FOTO)   76% (24% limitation)      AROM   Left Knee Extension  0   no quad lag with SLR   Left Knee Flexion  131      Strength   Right Hip Flexion  5/5    Right Hip Extension  5/5    Right Hip ABduction  5/5    Right Hip ADduction  5/5    Left Hip Flexion  5/5    Left Hip Extension  5/5    Left Hip ABduction  5/5    Left Hip ADduction  5/5    Right Knee Flexion  5/5    Right Knee Extension  5/5    Left Knee Flexion  4+/5    Left Knee Extension  4+/5                   [facility] Adult PT Treatment/Exercise - 01/09/19 1420      Knee/Hip Exercises: Stretches   Quad Stretch  Left;60 seconds;1   rep      Knee/Hip Exercises: Aerobic   Recumbent Bike  L3 x 7 min      Knee/Hip Exercises: Machines for Strengthening   Cybex Knee Flexion  B LE's: 25# x 15 reps     Cybex Leg Press  L only 35#  2 x 15 reps       Knee/Hip Exercises: Standing   Lateral Step Up  Left;15 reps;Hand Hold: 2    Lateral Step Up Limitations  9" stool - cues for slow eccentric lowering     Forward Step Up  Left;15 reps;Hand Hold: 1    Forward Step Up Limitations  9" stool - cues required for eccentric control              PT Education - 01/09/19 1459    Education Details  HEP update; lateral step-down, gym program with recumbent bike, leg press machine, hamstring curl machine     Person(s) Educated  Patient    Methods  Explanation;Demonstration;Verbal cues;Handout    Comprehension  Verbalized understanding;Returned demonstration;Verbal cues required;Need further instruction       PT Short Term Goals - 11/07/18 1039      PT SHORT TERM GOAL #1   Title  Independent with initial HEP    Status  Achieved        PT Long Term Goals - 01/09/19 1440      PT LONG TERM GOAL #1   Title  Independent with advanced/ongoing HEP    Status  Achieved      PT LONG TERM GOAL #2   Title  L  knee AROM >/= 0-125 to allow for normal gait and mobility    Status  Achieved      PT LONG TERM GOAL #3   Title  L hip and knee strength >/= 4+/5 for improved stability    Status  Achieved      PT LONG TERM GOAL #4   Title  Pt will ambulate with normal gait pattern w/o evidence of L quad instability    Status  Achieved      PT LONG TERM GOAL #5   Title  Pt will negotiate stairs with good reciprocal pattern to allow improved access to all levels of home    Status  Achieved      PT LONG TERM GOAL #6   Title  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  Coralyn Mark reporting MD wanting him to finish with therapy and return to work tomorrow.  Wishes to finish with therapy and supervising PT approving this plan with plans for d/c.  Pt. 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  Updated HEP today to focus on remaining deficits and added activities for gym program as pt. noting he may return to gym.  Pt. leaving session pain free.  Pt. now d/c from therapy.      Rehab Potential  Good    PT Treatment/Interventions  Patient/family education;Therapeutic exercise;Therapeutic activities;Neuromuscular re-education;Balance training;ADLs/Self Care Home Management;Manual techniques;Scar mobilization;Dry needling;Taping;Electrical Stimulation;Cryotherapy;Vasopneumatic Device    PT Next Visit Plan  d/c    Consulted and  Agree with Plan of Care  Patient       Patient will benefit from skilled therapeutic intervention in order to improve the following deficits and impairments:  Decreased range of motion, Impaired flexibility, Decreased strength, Difficulty walking, Abnormal gait, Decreased activity tolerance, Decreased balance,  Decreased scar mobility  Visit Diagnosis: Stiffness of left knee, not elsewhere classified  Muscle weakness (generalized)  Difficulty in walking, not elsewhere classified  Other abnormalities of gait and mobility     Problem List Patient Active Problem List   Diagnosis Date Noted  . Quadriceps tendon rupture 09/19/2018  . Rupture of left quadriceps tendon 09/18/2018  . Generalized anxiety disorder 08/17/2016  . Non-compliant behavior 08/12/2015  . SNORING, HX OF 04/29/2010  . DYSPHAGIA UNSPECIFIED 01/13/2009  . HYPERLIPIDEMIA 12/25/2007  . Essential hypertension 12/25/2007  . NONSPEC ELEVATION OF LEVELS OF TRANSAMINASE/LDH 12/25/2007    Bess Harvest, PTA 01/09/19 3:00 PM   [facility] [place] [address]  [place], [place], [address] Phone: [phone]   Fax:  [phone]  Name: ANDRUS SHARP MRN: [id number] Date of Birth: 1961/04/06  PHYSICAL THERAPY DISCHARGE SUMMARY  Visits from Start of Care: 20  Current functional level related to goals / functional outcomes:   Refer to above clinical impression.   Remaining deficits:   As above.   Education / Equipment:   HEP  Plan: Patient agrees to discharge.  Patient goals were not met. Patient is being discharged due to meeting the stated rehab goals.  ?????     Percival Spanish, PT, MPT 01/10/19, 11:18 AM  [facility] [address]  [place], [place], [address] Phone: [phone]   Fax:  [phone]

## 2019-01-09 NOTE — Progress Notes (Signed)
Office Visit Note   Patient: Justin Townsend           Date of Birth: 09-18-1961           MRN: [id number] Visit Date: 01/09/2019              Requested by: Sharlene Dory, DO [address] [place], [place] [address] PCP: Sharlene Dory, DO   Assessment & Plan: Visit Diagnoses:  1. Rupture of left quadriceps tendon, sequela     Plan: Work slip given for work resumption tomorrow without restrictions.  We will supply him with a knee sleeve he can use intermittently.  Final visit 1 month.  If he is doing well he can cancel his visit.  Follow-Up Instructions: No follow-ups on file.   Orders:  No orders of the defined types were placed in this encounter.  No orders of the defined types were placed in this encounter.     Procedures: No procedures performed   Clinical Data: No additional findings.   Subjective: Chief Complaint  Patient presents with  . Left Knee - Follow-up    09/19/18 Left Quad Tendon Repair    HPI follow-up left quad tendon repair 09/19/2018.  He is progressed well with physical therapy.  Review of Systems updated and unchanged as pertains HPI.   Objective: Vital Signs: BP 138/90   Pulse 72   Ht 5\' 11"  (1.803 m)   Wt 257 lb (116.6 kg)   BMI 35.84 kg/m   Physical Exam Constitutional:      Appearance: He is well-developed.  HENT:     Head: Normocephalic and atraumatic.  Eyes:     Pupils: Pupils are equal, round, and reactive to light.  Neck:     Thyroid: No thyromegaly.     Trachea: No tracheal deviation.  Cardiovascular:     Rate and Rhythm: Normal rate.  Pulmonary:     Effort: Pulmonary effort is normal.     Breath sounds: No wheezing.  Abdominal:     General: Bowel sounds are normal.     Palpations: Abdomen is soft.  Skin:    General: Skin is warm and dry.     Capillary Refill: Capillary refill takes less than 2 seconds.  Neurological:     Mental Status: He is alert and oriented to person, place,  and time.  Psychiatric:        Behavior: Behavior normal.        Thought Content: Thought content normal.        Judgment: Judgment normal.     Ortho Exam patient has good range of motion well-healed midline incision.  Quad tendon palpable in continuity.  He has full extension mild crepitus with extension more the left than right knee.  Distal pulses are intact.  Negative [place]oman.  Specialty Comments:  No specialty comments available.  Imaging: No results found.   PMFS History: Patient Active Problem List   Diagnosis Date Noted  . Quadriceps tendon rupture 09/19/2018  . Rupture of left quadriceps tendon 09/18/2018  . Generalized anxiety disorder 08/17/2016  . Non-compliant behavior 08/12/2015  . SNORING, HX OF 04/29/2010  . DYSPHAGIA UNSPECIFIED 01/13/2009  . HYPERLIPIDEMIA 12/25/2007  . Essential hypertension 12/25/2007  . NONSPEC ELEVATION OF LEVELS OF TRANSAMINASE/LDH 12/25/2007   Past Medical History:  Diagnosis Date  . Anxiety   . Asthma    only as child; resolved by age 22  . Fatty liver   . Gallstones   .  GERD (gastroesophageal reflux disease)   . History of hiatal hernia   . Hyperlipidemia   . Hypertension     Family History  Problem Relation Age of Onset  . Ulcers Mother   . Cirrhosis Mother        ? Alcohol Related   . Cancer Paternal Grandfather        CNS Cancer  . Panic disorder Other        Maternal FH  . Anxiety disorder Other        Maternal FH  . Heart attack Maternal Grandfather        >55  . Diabetes Neg Hx   . Stroke Neg Hx   . Colon cancer Neg Hx   . Colon polyps Neg Hx     Past Surgical History:  Procedure Laterality Date  . no colonoscopy     SOC reviewed  . ORIF TOE FRACTURE Right    foot  . QUADRICEPS TENDON REPAIR Left 09/19/2018   Procedure: REPAIR LEFT KNEE QUADRICEPS TENDON;  Surgeon: Eldred Manges, MD;  Location: [facility] OR;  Service: Orthopedics;  Laterality: Left;  . Thumb Surgery Left    pinning post dislocation   Social  History   Occupational History  . Occupation: Air traffic controller: [employer]  Tobacco Use  . Smoking status: Light Tobacco Smoker    Types: Cigars  . Smokeless tobacco: Never Used  . Tobacco comment: rare cigar  Substance and Sexual Activity  . Alcohol use: Yes    Alcohol/week: 10.0 standard drinks    Types: 10 Cans of beer per week    Comment: Socially on weekend  . Drug use: No  . Sexual activity: Yes    Partners: Female

## 2019-01-18 ENCOUNTER — Other Ambulatory Visit: Payer: Self-pay | Admitting: Family Medicine

## 2019-01-18 DIAGNOSIS — R6889 Other general symptoms and signs: Secondary | ICD-10-CM

## 2019-02-10 ENCOUNTER — Encounter (HOSPITAL_BASED_OUTPATIENT_CLINIC_OR_DEPARTMENT_OTHER): Payer: Self-pay | Admitting: *Deleted

## 2019-02-10 ENCOUNTER — Other Ambulatory Visit: Payer: Self-pay

## 2019-02-10 ENCOUNTER — Emergency Department (HOSPITAL_BASED_OUTPATIENT_CLINIC_OR_DEPARTMENT_OTHER)
Admission: EM | Admit: 2019-02-10 | Discharge: 2019-02-10 | Disposition: A | Discharge status: Home / Self Care | Payer: PRIVATE HEALTH INSURANCE | Attending: Emergency Medicine | Admitting: Emergency Medicine

## 2019-02-10 DIAGNOSIS — J45909 Unspecified asthma, uncomplicated: Secondary | ICD-10-CM | POA: Diagnosis not present

## 2019-02-10 DIAGNOSIS — R1310 Dysphagia, unspecified: Secondary | ICD-10-CM | POA: Diagnosis present

## 2019-02-10 DIAGNOSIS — F17290 Nicotine dependence, other tobacco product, uncomplicated: Secondary | ICD-10-CM | POA: Insufficient documentation

## 2019-02-10 DIAGNOSIS — I10 Essential (primary) hypertension: Secondary | ICD-10-CM | POA: Insufficient documentation

## 2019-02-10 MED ORDER — GLUCAGON HCL RDNA (DIAGNOSTIC) 1 MG IJ SOLR
1.0000 mg | Freq: Once | INTRAMUSCULAR | Status: AC
  Administered 2019-02-10: 1 mg via INTRAVENOUS
  Filled 2019-02-10: qty 1

## 2019-02-10 MED ORDER — ONDANSETRON HCL 4 MG/2ML IJ SOLN
4.0000 mg | Freq: Once | INTRAMUSCULAR | Status: AC
  Administered 2019-02-10: 4 mg via INTRAVENOUS
  Filled 2019-02-10: qty 2

## 2019-02-10 NOTE — ED Notes (Signed)
PO challenge given  

## 2019-02-10 NOTE — ED Provider Notes (Signed)
Emergency Department Provider Note   I have reviewed the triage vital signs and the nursing notes.   HISTORY  Chief Complaint Dysphagia   HPI Justin Townsend is a 58 y.o. male with PMH of hiatal hernia, HLD, HTN, and asthma presents to the emergency department for evaluation of inability to swallow food or liquid without having to spit it back out.  Patient was eating steak earlier tonight but did not have the sensation of it suddenly getting stuck.  He is not having significant pain or throat fullness sensation. No shortness of breath.  No choking sensation.  He states that he tried to drink tea but it had to be spit back out.  He can swallow his saliva.  Denies any chest pain. No radiation of symptoms or modifying factors. Had endoscopy 6 months prior with Dr. Marina Goodell with [facility]. Patient's wife reports having esophageal dilation at that time. Patient was having intermittent dysphagia then as well.   Past Medical History:  Diagnosis Date  . Anxiety   . Asthma    only as child; resolved by age 29  . Fatty liver   . Gallstones   . GERD (gastroesophageal reflux disease)   . History of hiatal hernia   . Hyperlipidemia   . Hypertension     Patient Active Problem List   Diagnosis Date Noted  . Quadriceps tendon rupture 09/19/2018  . Rupture of left quadriceps tendon 09/18/2018  . Generalized anxiety disorder 08/17/2016  . Non-compliant behavior 08/12/2015  . SNORING, HX OF 04/29/2010  . DYSPHAGIA UNSPECIFIED 01/13/2009  . HYPERLIPIDEMIA 12/25/2007  . Essential hypertension 12/25/2007  . NONSPEC ELEVATION OF LEVELS OF TRANSAMINASE/LDH 12/25/2007    Past Surgical History:  Procedure Laterality Date  . no colonoscopy     SOC reviewed  . ORIF TOE FRACTURE Right    foot  . QUADRICEPS TENDON REPAIR Left 09/19/2018   Procedure: REPAIR LEFT KNEE QUADRICEPS TENDON;  Surgeon: Eldred Manges, MD;  Location: [facility] OR;  Service: Orthopedics;  Laterality: Left;  . Thumb Surgery Left    pinning post dislocation   Allergies Patient has no known allergies.  Family History  Problem Relation Age of Onset  . Ulcers Mother   . Cirrhosis Mother        ? Alcohol Related   . Cancer Paternal Grandfather        CNS Cancer  . Panic disorder Other        Maternal FH  . Anxiety disorder Other        Maternal FH  . Heart attack Maternal Grandfather        >55  . Diabetes Neg Hx   . Stroke Neg Hx   . Colon cancer Neg Hx   . Colon polyps Neg Hx     Social History Social History   Tobacco Use  . Smoking status: Light Tobacco Smoker    Types: Cigars  . Smokeless tobacco: Never Used  . Tobacco comment: rare cigar  Substance Use Topics  . Alcohol use: Yes    Alcohol/week: 10.0 standard drinks    Types: 10 Cans of beer per week    Comment: Socially on weekend  . Drug use: No    Review of Systems  Constitutional: No fever/chills Eyes: No visual changes. ENT: No sore throat. Inability to swallow food/liquids.  Cardiovascular: Denies chest pain. Respiratory: Denies shortness of breath. Gastrointestinal: No abdominal pain.  No nausea, no vomiting.  No diarrhea.  No constipation. Genitourinary:  Negative for dysuria. Musculoskeletal: Negative for back pain. Skin: Negative for rash. Neurological: Negative for headaches, focal weakness or numbness.  10-point ROS otherwise negative.  ____________________________________________   PHYSICAL EXAM:  VITAL SIGNS: ED Triage Vitals  Enc Vitals Group     BP 02/10/19 2237 133/89     Pulse Rate 02/10/19 2237 78     Resp 02/10/19 2237 16     Temp 02/10/19 2237 98.5 F (36.9 C)     Temp Source 02/10/19 2237 Oral     SpO2 02/10/19 2237 99 %     Weight 02/10/19 2235 253 lb (114.8 kg)     Height 02/10/19 2235 5\' 11"  (1.803 m)     Pain Score 02/10/19 2234 0   Constitutional: Alert and oriented. Well appearing and in no acute distress. Eyes: Conjunctivae are normal.  Head: Atraumatic. Nose: No congestion/rhinnorhea.  Mouth/Throat: Mucous membranes are moist.Tolerating oral secretions.  Neck: No stridor.   Cardiovascular: Normal rate, regular rhythm. Good peripheral circulation. Grossly normal heart sounds.   Respiratory: Normal respiratory effort.  No retractions. Lungs CTAB. Gastrointestinal: Soft and nontender. No distention.  Musculoskeletal: No lower extremity tenderness nor edema. No gross deformities of extremities. Neurologic:  Normal speech and language. No gross focal neurologic deficits are appreciated.  Skin:  Skin is warm, dry and intact. No rash noted.  ____________________________________________  RADIOLOGY  None  ____________________________________________   PROCEDURES  Procedure(s) performed:   Procedures  None  ____________________________________________   INITIAL IMPRESSION / ASSESSMENT AND PLAN / ED COURSE  Pertinent labs & imaging results that were available during my care of the patient were reviewed by me and considered in my medical decision making (see chart for details).  Patient presents with history of dysphagia tonight. No significant pain or sense or FB in throat but cannot swallow liquids at dinner. Tolerating oral secretions. Plan for Glucagon, Zofran, and PO challenge in the ED.   11:45 PM  Spoke with Dr. Leone Payor regarding the case.  Patient was able to drink of the soda without difficulty.  He tried to drink some water immediately afterwards and had some small volume emesis which appeared to only be water.  He is managing his oral secretions.  He is not in pain or any distress.  Given his clinical picture, Dr. Leone Payor would like to schedule an appointment for the morning in the clinic for likely endoscopy at that time.  Patient will stay n.p.o. until then.  Advised to return to the [facility] emergency department if symptoms abruptly worsen overnight.  Patient and wife comfortable with plan at discharge. ____________________________________________  FINAL  CLINICAL IMPRESSION(S) / ED DIAGNOSES  Final diagnoses:  Dysphagia, unspecified type    MEDICATIONS GIVEN DURING THIS VISIT:  Medications  glucagon (human recombinant) (GLUCAGEN) injection 1 mg (1 mg Intravenous Given 02/10/19 2259)  ondansetron (ZOFRAN) injection 4 mg (4 mg Intravenous Given 02/10/19 2258)    Note:  This document was prepared using Dragon voice recognition software and may include unintentional dictation errors.  Alona Bene, MD Emergency Medicine    Parish Dubose, Arlyss Repress, MD 02/10/19 [phone]

## 2019-02-10 NOTE — Discharge Instructions (Signed)
You will need to call your GI in the morning to confirm your appointment. Do not eat or drink from now until your appointment in the morning. If your symptoms worsen you will need to go to the [facility] ED for urgent endoscopy.

## 2019-02-10 NOTE — ED Notes (Signed)
ED Provider at bedside. 

## 2019-02-10 NOTE — ED Triage Notes (Signed)
Pt reports eating steak this evening and since then he has had difficulty swallowing liquids, states "they come back up". He had an endoscopy in august to evaluate swallowing problems and is not taking med for acid reflux

## 2019-02-11 ENCOUNTER — Ambulatory Visit (HOSPITAL_COMMUNITY): Payer: PRIVATE HEALTH INSURANCE | Admitting: Certified Registered"

## 2019-02-11 ENCOUNTER — Other Ambulatory Visit: Payer: Self-pay

## 2019-02-11 ENCOUNTER — Telehealth: Payer: Self-pay | Admitting: Internal Medicine

## 2019-02-11 ENCOUNTER — Encounter (HOSPITAL_COMMUNITY)
Admission: RE | Disposition: A | Discharge status: Home / Self Care | Payer: Self-pay | Source: Ambulatory Visit | Attending: Internal Medicine

## 2019-02-11 ENCOUNTER — Encounter (HOSPITAL_COMMUNITY): Payer: Self-pay | Admitting: *Deleted

## 2019-02-11 ENCOUNTER — Telehealth (INDEPENDENT_AMBULATORY_CARE_PROVIDER_SITE_OTHER): Payer: Self-pay

## 2019-02-11 ENCOUNTER — Ambulatory Visit (HOSPITAL_COMMUNITY)
Admission: RE | Admit: 2019-02-11 | Discharge: 2019-02-11 | Disposition: A | Discharge status: Home / Self Care | Payer: PRIVATE HEALTH INSURANCE | Source: Ambulatory Visit | Attending: Internal Medicine | Admitting: Internal Medicine

## 2019-02-11 DIAGNOSIS — Z79899 Other long term (current) drug therapy: Secondary | ICD-10-CM | POA: Insufficient documentation

## 2019-02-11 DIAGNOSIS — T18108A Unspecified foreign body in esophagus causing other injury, initial encounter: Secondary | ICD-10-CM | POA: Diagnosis present

## 2019-02-11 DIAGNOSIS — F419 Anxiety disorder, unspecified: Secondary | ICD-10-CM | POA: Diagnosis not present

## 2019-02-11 DIAGNOSIS — W44F3XA Food entering into or through a natural orifice, initial encounter: Secondary | ICD-10-CM

## 2019-02-11 DIAGNOSIS — I10 Essential (primary) hypertension: Secondary | ICD-10-CM | POA: Insufficient documentation

## 2019-02-11 DIAGNOSIS — F17290 Nicotine dependence, other tobacco product, uncomplicated: Secondary | ICD-10-CM | POA: Diagnosis not present

## 2019-02-11 DIAGNOSIS — T18128A Food in esophagus causing other injury, initial encounter: Secondary | ICD-10-CM | POA: Diagnosis not present

## 2019-02-11 DIAGNOSIS — K219 Gastro-esophageal reflux disease without esophagitis: Secondary | ICD-10-CM | POA: Insufficient documentation

## 2019-02-11 DIAGNOSIS — R1310 Dysphagia, unspecified: Secondary | ICD-10-CM

## 2019-02-11 DIAGNOSIS — K222 Esophageal obstruction: Secondary | ICD-10-CM | POA: Diagnosis not present

## 2019-02-11 DIAGNOSIS — E785 Hyperlipidemia, unspecified: Secondary | ICD-10-CM | POA: Diagnosis not present

## 2019-02-11 HISTORY — PX: ESOPHAGOGASTRODUODENOSCOPY (EGD) WITH PROPOFOL: SHX5813

## 2019-02-11 HISTORY — PX: FOREIGN BODY REMOVAL: SHX962

## 2019-02-11 SURGERY — ESOPHAGOGASTRODUODENOSCOPY (EGD) WITH PROPOFOL
Anesthesia: General

## 2019-02-11 MED ORDER — FENTANYL CITRATE (PF) 100 MCG/2ML IJ SOLN
INTRAMUSCULAR | Status: AC
  Filled 2019-02-11: qty 2

## 2019-02-11 MED ORDER — LIDOCAINE 2% (20 MG/ML) 5 ML SYRINGE
INTRAMUSCULAR | Status: DC | PRN
  Administered 2019-02-11: 60 mg via INTRAVENOUS

## 2019-02-11 MED ORDER — SODIUM CHLORIDE 0.9 % IV SOLN: INTRAVENOUS | Status: DC

## 2019-02-11 MED ORDER — PROPOFOL 10 MG/ML IV BOLUS
INTRAVENOUS | Status: DC | PRN
  Administered 2019-02-11: 200 mg via INTRAVENOUS

## 2019-02-11 MED ORDER — ONDANSETRON HCL 4 MG/2ML IJ SOLN
INTRAMUSCULAR | Status: DC | PRN
  Administered 2019-02-11: 4 mg via INTRAVENOUS

## 2019-02-11 MED ORDER — FENTANYL CITRATE (PF) 100 MCG/2ML IJ SOLN
INTRAMUSCULAR | Status: DC | PRN
  Administered 2019-02-11: 50 ug via INTRAVENOUS

## 2019-02-11 MED ORDER — LACTATED RINGERS IV SOLN
INTRAVENOUS | Status: DC
  Administered 2019-02-11: 1000 mL via INTRAVENOUS

## 2019-02-11 MED ORDER — SUCCINYLCHOLINE CHLORIDE 200 MG/10ML IV SOSY
PREFILLED_SYRINGE | INTRAVENOUS | Status: DC | PRN
  Administered 2019-02-11: 140 mg via INTRAVENOUS

## 2019-02-11 SURGICAL SUPPLY — 14 items

## 2019-02-11 NOTE — Anesthesia Preprocedure Evaluation (Addendum)
Anesthesia Evaluation  Patient identified by MRN, date of birth, ID band  Reviewed: Allergy & Precautions, NPO status   Airway Mallampati: II  TM Distance: >3 FB     Dental   Pulmonary asthma , Current Smoker,    breath sounds clear to auscultation       Cardiovascular hypertension,  Rhythm:Regular Rate:Normal     Neuro/Psych    GI/Hepatic hiatal hernia, GERD  ,  Endo/Other    Renal/GU      Musculoskeletal   Abdominal   Peds  Hematology   Anesthesia Other Findings   Reproductive/Obstetrics                             Anesthesia Physical Anesthesia Plan  ASA: III  Anesthesia Plan: General   Post-op Pain Management:    Induction: Intravenous  PONV Risk Score and Plan: Ondansetron and Dexamethasone  Airway Management Planned: Oral ETT  Additional Equipment:   Intra-op Plan:   Post-operative Plan: Extubation in OR  Informed Consent: I have reviewed the patients History and Physical, chart, labs and discussed the procedure including the risks, benefits and alternatives for the proposed anesthesia with the patient or authorized representative who has indicated his/her understanding and acceptance.     Dental advisory given  Plan Discussed with: CRNA and Anesthesiologist  Anesthesia Plan Comments:         Anesthesia Quick Evaluation

## 2019-02-11 NOTE — Telephone Encounter (Signed)
Spoke to wife  Patient had a good night and was able to sleep  I have advised take BP meds and Prozac w/ some water today  Seems like EGD w/ possible food disimpaction vs dilation today at [facility] today - there are openings   I told them to expect a call from [place] again this AM

## 2019-02-11 NOTE — Telephone Encounter (Signed)
Patient presented to ED after dysphagia ? Impaction with steak.  Was able to clear secretions, drank a Coke  Tried water and regurgitated that.  Was not having any pain or major foreign body sensation.  Advised release from ED was at [facility], stay NPO and try tio arrange EGD today  Plan to come to [facility] or [facility] if sxs worsen before I can contact him

## 2019-02-11 NOTE — Op Note (Signed)
[facility] Patient Name: Justin Townsend Procedure Date: 02/11/2019 MRN: [id number] Attending MD: Iva Booparl E Lorann Tani , MD Date of Birth: Mar 24, 1961 CSN: [id number] Age: [age] Admit Type: Outpatient Procedure:                Upper GI endoscopy Indications:              Foreign body in the esophagus Providers:                Iva Booparl E. Devona Holmes, MD, Dwain SarnaPatricia Ford, RN, Zoila ShutterGary                            Bryant, Technician, Greig RightLogan Key, CRNA Referring MD:              Medicines:                General Anesthesia Complications:            No immediate complications. Estimated Blood Loss:     Estimated blood loss: none. Procedure:                Pre-Anesthesia Assessment:                           - Prior to the procedure, a History and Physical                            was performed, and patient medications and                            allergies were reviewed. The patient's tolerance of                            previous anesthesia was also reviewed. The risks                            and benefits of the procedure and the sedation                            options and risks were discussed with the patient.                            All questions were answered, and informed consent                            was obtained. Prior Anticoagulants: The patient has                            taken no previous anticoagulant or antiplatelet                            agents. ASA Grade Assessment: II - A patient with                            mild systemic disease. After reviewing the risks  and benefits, the patient was deemed in                            satisfactory condition to undergo the procedure.                           After obtaining informed consent, the endoscope was                            passed under direct vision. Throughout the                            procedure, the patient's blood pressure, pulse, and                            oxygen  saturations were monitored continuously. The                            GIF-H190 ([id number]) Olympus gastroscope was                            introduced through the mouth, and advanced to the                            prepyloric region, stomach. The upper GI endoscopy                            was accomplished without difficulty. The patient                            tolerated the procedure well. Scope In: Scope Out: Findings:      Food was found in the lower third of the esophagus. Removal of food was       accomplished. Estimated blood loss: none.      The entire examined stomach was normal.      The cardia and gastric fundus were normal on retroflexion.      The exam was otherwise without abnormality. Impression:               - Food in the lower third of the esophagus. Removal                            was successful. Chopped food bolus with snare and                            advanced to stomach, no trauma.                           - Normal stomach.                           - The examination was otherwise normal. Moderate Sedation:      Not Applicable - Patient had care per Anesthesia. Recommendation:           - Patient has a contact number available for  emergencies. The signs and symptoms of potential                            delayed complications were discussed with the                            patient. Return to normal activities tomorrow.                            Written discharge instructions were provided to the                            patient.                           - Advance diet as tolerated.                           - Continue present medications.                           - He had been swallowing well after his dilation                            and omeprazole rx that started and continued since                            Fall 2019. Sounds like he ate too quickly and did                            not chew well.                            No dilation but if develops pattern of dysphagia to                            call back.                           Cut small Chew well, eat slowly.                           cc: Yancey Flemings, MD Procedure Code(s):        --- Professional ---                           [phone], Esophagoscopy, flexible, transoral; with                            removal of foreign body(s) Diagnosis Code(s):        --- Professional ---                           [phone], Food in esophagus causing other injury,  initial encounter                           T18.108A, Unspecified foreign body in esophagus                            causing other injury, initial encounter CPT copyright 2018 American Medical Association. All rights reserved. The codes documented in this report are preliminary and upon coder review may  be revised to meet current compliance requirements. Iva Boop, MD 02/11/2019 2:36:45 PM This report has been signed electronically. Number of Addenda: 0

## 2019-02-11 NOTE — Anesthesia Procedure Notes (Signed)
Procedure Name: Intubation Date/Time: 02/11/2019 2:19 PM Performed by: Lavina Hamman, CRNA Pre-anesthesia Checklist: Patient identified, Emergency Drugs available, Suction available, Patient being monitored and Timeout performed Patient Re-evaluated:Patient Re-evaluated prior to induction Oxygen Delivery Method: Circle system utilized Preoxygenation: Pre-oxygenation with 100% oxygen Induction Type: IV induction, Rapid sequence and Cricoid Pressure applied Laryngoscope Size: Mac and 4 Grade View: Grade II Tube type: Oral Tube size: 7.0 mm Number of attempts: 1 Airway Equipment and Method: Stylet Placement Confirmation: ETT inserted through vocal cords under direct vision,  positive ETCO2,  CO2 detector and breath sounds checked- equal and bilateral Secured at: 23 cm Tube secured with: Tape Dental Injury: Teeth and Oropharynx as per pre-operative assessment  Comments: ATOI.  Pt intubated due to Covid 19 risk.  Pt asymptomatic.

## 2019-02-11 NOTE — Transfer of Care (Signed)
Immediate Anesthesia Transfer of Care Note  Patient: Justin Townsend  Procedure(s) Performed: ESOPHAGOGASTRODUODENOSCOPY (EGD) WITH PROPOFOL (N/A )  Patient Location: PACU  Anesthesia Type:General  Level of Consciousness: awake, alert , oriented and patient cooperative  Airway & Oxygen Therapy: Patient Spontanous Breathing and Patient connected to face mask oxygen  Post-op Assessment: Report given to RN and Post -op Vital signs reviewed and stable  Post vital signs: Reviewed and stable  Last Vitals:  Vitals Value Taken Time  BP    Temp    Pulse 86 02/11/2019  2:29 PM  Resp 17 02/11/2019  2:29 PM  SpO2 97 % 02/11/2019  2:29 PM  Vitals shown include unvalidated device data.  Last Pain:  Vitals:   02/11/19 1222  TempSrc: Oral  PainSc: 0-No pain         Complications: No apparent anesthesia complications

## 2019-02-11 NOTE — H&P (Signed)
[facility] History and Physical   Primary Care Physician:  Sharlene Dory, DO   Reason for Procedure:   Food impaction/dysphagia  Plan:    EGD, remove impacted steak if present, possibly dilate esophagus  The risks and benefits as well as alternatives of endoscopic procedure(s) have been discussed and reviewed. All questions answered. The patient agrees to proceed.   HPI: Justin Townsend is a 58 y.o. male who ate steak yesterday and had sxs of food impaction. Went to [facility] ED and was able to tolerate secretions amd drank some Coke but did regurgitate water. Here for EGD to assess and treat. Has hx EGD and esophageal dilation late 2019 Dr. Marina Goodell  Comfortable now but thinks meat still in the esophagus. No chest pain. Past Medical History:  Diagnosis Date  . Anxiety   . Asthma    only as child; resolved by age 37  . Fatty liver   . Gallstones   . GERD (gastroesophageal reflux disease)   . History of hiatal hernia   . Hyperlipidemia   . Hypertension     Past Surgical History:  Procedure Laterality Date  . COLONOSCOPY  2019  . ESOPHAGOGASTRODUODENOSCOPY (EGD) WITH ESOPHAGEAL DILATION  2019  . no colonoscopy     SOC reviewed  . ORIF TOE FRACTURE Right    foot  . QUADRICEPS TENDON REPAIR Left 09/19/2018   Procedure: REPAIR LEFT KNEE QUADRICEPS TENDON;  Surgeon: Eldred Manges, MD;  Location: [facility] OR;  Service: Orthopedics;  Laterality: Left;  . Thumb Surgery Left    pinning post dislocation    Prior to Admission medications   Medication Sig Start Date End Date Taking? Authorizing Provider  amLODipine (NORVASC) 5 MG tablet 1-2 tab po q day 12/12/18  Yes Saguier, Ramon Dredge, PA-C  ezetimibe (ZETIA) 10 MG tablet Take 1 tablet (10 mg total) by mouth daily. 12/12/18  Yes Sharlene Dory, DO  FLUoxetine (PROZAC) 10 MG tablet TAKE 1 TABLET BY MOUTH ONCE DAILY 12/11/18  Yes Sharlene Dory, DO  ibuprofen (ADVIL,MOTRIN) 800 MG tablet TAKE 1 TABLET BY  MOUTH EVERY 8 HOURS AS NEEDED 01/18/19  Yes Sharlene Dory, DO  lisinopril (PRINIVIL,ZESTRIL) 10 MG tablet Take 2 tablets (20 mg total) by mouth daily. 12/12/18  Yes Sharlene Dory, DO  Multiple Vitamins-Minerals (MULTIVITAMIN PO) Take 1 tablet by mouth daily.    Yes [provider]  Omega-3 Fatty Acids (FISH OIL ULTRA) 1400 MG CAPS Take 2,800 mg by mouth daily.   Yes [provider]  omeprazole (PRILOSEC) 40 MG capsule Take 1 capsule (40 mg total) by mouth daily. 12/12/18  Yes Sharlene Dory, DO    Current Facility-Administered Medications  Medication Dose Route Frequency Provider Last Rate Last Dose  . 0.9 %  sodium chloride infusion   Intravenous Continuous Iva Boop, MD      . lactated ringers infusion   Intravenous Continuous Iva Boop, MD 10 mL/hr at 02/11/19 1225 1,000 mL at 02/11/19 1225    Allergies as of 02/11/2019  . (No Known Allergies)    Family History  Problem Relation Age of Onset  . Ulcers Mother   . Cirrhosis Mother        ? Alcohol Related   . Cancer Paternal Grandfather        CNS Cancer  . Panic disorder Other        Maternal FH  . Anxiety disorder Other  Maternal FH  . Heart attack Maternal Grandfather        >55  . Diabetes Neg Hx   . Stroke Neg Hx   . Colon cancer Neg Hx   . Colon polyps Neg Hx     Social History   Socioeconomic History  . Marital status: Married    Spouse name: Not on file  . Number of children: 0  . Years of education: Not on file  . Highest education level: Not on file  Occupational History  . Occupation: Air traffic controller: [employer]  Social Needs  . Financial resource strain: Not on file  . Food insecurity:    Worry: Not on file    Inability: Not on file  . Transportation needs:    Medical: Not on file    Non-medical: Not on file  Tobacco Use  . Smoking status: Light Tobacco Smoker    Types: Cigars  . Smokeless tobacco: Never Used  .  Tobacco comment: rare cigar  Substance and Sexual Activity  . Alcohol use: Yes    Alcohol/week: 10.0 standard drinks    Types: 10 Cans of beer per week    Comment: Socially on weekend  . Drug use: No  . Sexual activity: Yes    Partners: Female  Lifestyle  . Physical activity:    Days per week: Not on file    Minutes per session: Not on file  . Stress: Not on file  Relationships  . Social connections:    Talks on phone: Not on file    Gets together: Not on file    Attends religious service: Not on file    Active member of club or organization: Not on file    Attends meetings of clubs or organizations: Not on file    Relationship status: Not on file  . Intimate partner violence:    Fear of current or ex partner: Not on file    Emotionally abused: Not on file    Physically abused: Not on file    Forced sexual activity: Not on file  Other Topics Concern  . Not on file  Social History Narrative   Married   No kids   Truck driver   + cigars, + EtOH    Review of Systems:  All other review of systems negative except as mentioned in the HPI.  Physical Exam: Vital signs in last 24 hours: Temp:  [98.5 F (36.9 C)] 98.5 F (36.9 C) (03/23 1222) Pulse Rate:  [76-78] 76 (03/22 2350) Resp:  [10-16] 10 (03/23 1222) BP: (129-151)/(75-94) 151/94 (03/23 1222) SpO2:  [94 %-100 %] 100 % (03/23 1222) Weight:  [114.8 kg] 114.8 kg (03/22 2235)   General:   Alert,  Well-developed, well-nourished, pleasant and cooperative in NAD Lungs:  Clear throughout to auscultation.   Heart:  Regular rate and rhythm; no murmurs, clicks, rubs,  or gallops. Abdomen:  Soft, nontender and nondistended. Normal bowel sounds.   Neuro/Psych:  Alert and cooperative. Normal mood and affect. A and O x 3   @  Sena Slate, MD, [facility] [phone] (pager) 02/11/2019 1:51 PM@

## 2019-02-11 NOTE — Telephone Encounter (Signed)
I am able to do it at 130 today if that is necessary

## 2019-02-11 NOTE — Discharge Instructions (Signed)
° °  I advanced the steak into your stomach.  Please be more careful and cut food small and chew well.  I did not see any strictures or narrowing to dilate.  If you start having swallowing problems call us back.  I appreciate the opportunity to care for you. Iva Boop, MD, FACG   YOU HAD AN ENDOSCOPIC PROCEDURE TODAY: Refer to the procedure report and other information in the discharge instructions given to you for any specific questions about what was found during the examination. If this information does not answer your questions, please call Dr. Marvell Fuller office at [phone] to clarify.   YOU SHOULD EXPECT: Some feelings of bloating in the abdomen. Passage of more gas than usual. Walking can help get rid of the air that was put into your GI tract during the procedure and reduce the bloating. If you had a lower endoscopy (such as a colonoscopy or flexible sigmoidoscopy) you may notice spotting of blood in your stool or on the toilet paper. Some abdominal soreness may be present for a day or two, also.  DIET: Your first meal following the procedure should be liquids then  a soft, light meal and then it is ok to progress to your normal diet.  Drink plenty of fluids but you should avoid alcoholic beverages for 24 hours.   ACTIVITY: Your care partner should take you home directly after the procedure. You should plan to take it easy, moving slowly for the rest of the day. You can resume normal activity the day after the procedure however YOU SHOULD NOT DRIVE, use power tools, machinery or perform tasks that involve climbing or major physical exertion for 24 hours (because of the sedation medicines used during the test).   SYMPTOMS TO REPORT IMMEDIATELY: A gastroenterologist can be reached at any hour. Please call [phone]  for any of the following symptoms:   Following upper endoscopy (EGD, EUS, ERCP, esophageal dilation) Vomiting of blood or coffee ground material  New,  significant abdominal pain  New, significant chest pain or pain under the shoulder blades  Painful or persistently difficult swallowing  New shortness of breath  Black, tarry-looking or red, bloody stools  FOLLOW UP:  If any biopsies were taken you will be contacted by phone or by letter within the next 1-3 weeks. Call [phone]  if you have not heard about the biopsies in 3 weeks.  Please also call with any specific questions about appointments or follow up tests.

## 2019-02-11 NOTE — Telephone Encounter (Signed)
Bonita Quin, I spoke to Dr. Leone Payor. This patient needs EGD +/- esophageal dilation for possible food impaction today (preferably this morning). I am unavailable, but please notify and arrange with Dr. Chales Abrahams who is this hospital doc this week. Thanks  Dr. Marina Goodell

## 2019-02-11 NOTE — Telephone Encounter (Signed)
Dr. Chales Abrahams this pt needs an EGD with possible dil today. Please see messages below. Please advise if you are available to do this today.

## 2019-02-11 NOTE — Anesthesia Postprocedure Evaluation (Signed)
Anesthesia Post Note  Patient: Justin Townsend  Procedure(s) Performed: ESOPHAGOGASTRODUODENOSCOPY (EGD) WITH PROPOFOL (N/A )     Patient location during evaluation: Endoscopy Anesthesia Type: General Level of consciousness: awake Pain management: pain level controlled Vital Signs Assessment: post-procedure vital signs reviewed and stable Respiratory status: spontaneous breathing Cardiovascular status: stable Postop Assessment: no apparent nausea or vomiting Anesthetic complications: no    Last Vitals:  Vitals:   02/11/19 1435 02/11/19 1440  BP:  (!) 145/82  Pulse: 85 83  Resp: 14 15  Temp:    SpO2: 94% 93%    Last Pain:  Vitals:   02/11/19 1433  TempSrc:   PainSc: 0-No pain                 Sangita Zani

## 2019-02-11 NOTE — Telephone Encounter (Signed)
Called patient and asked the screening questions.  Do you have now or have you had in the past 7 days a fever and/or chills? NO  Do you have now or have you had in the past 7 days a cough? NO  Do you have now or have you had in the last 7 days nausea, vomiting or abdominal pain? NO  Have you been exposed to anyone who has tested positive for COVID-19? NO  Have you or anyone who lives with you traveled within the last month? NO 

## 2019-02-11 NOTE — Telephone Encounter (Signed)
Pt scheduled for EGD with possible dil at [facility] today at 1:30pm, pt to arrive there at 12noon. Pt has not had any food and knows to continue to be NPO. Pts wife aware that only one person to go with pt to appt and she will have an area at front of hospital to wait. Pt to check-in with admitting.

## 2019-02-12 ENCOUNTER — Encounter (INDEPENDENT_AMBULATORY_CARE_PROVIDER_SITE_OTHER): Payer: Self-pay | Admitting: Orthopaedic Surgery

## 2019-02-12 ENCOUNTER — Ambulatory Visit (INDEPENDENT_AMBULATORY_CARE_PROVIDER_SITE_OTHER): Payer: PRIVATE HEALTH INSURANCE | Admitting: Orthopaedic Surgery

## 2019-02-12 ENCOUNTER — Other Ambulatory Visit: Payer: Self-pay

## 2019-02-12 VITALS — Ht 71.0 in | Wt 253.0 lb

## 2019-02-12 DIAGNOSIS — M659 Synovitis and tenosynovitis, unspecified: Secondary | ICD-10-CM | POA: Diagnosis not present

## 2019-02-12 MED ORDER — LIDOCAINE HCL 1 % IJ SOLN
0.5000 mL | INTRAMUSCULAR | Status: AC | PRN
  Administered 2019-02-12: .5 mL

## 2019-02-12 MED ORDER — BUPIVACAINE HCL 0.25 % IJ SOLN
4.0000 mL | INTRAMUSCULAR | Status: AC | PRN
  Administered 2019-02-12: 4 mL via INTRA_ARTICULAR

## 2019-02-12 MED ORDER — METHYLPREDNISOLONE ACETATE 40 MG/ML IJ SUSP
40.0000 mg | INTRAMUSCULAR | Status: AC | PRN
  Administered 2019-02-12: 40 mg via INTRA_ARTICULAR

## 2019-02-12 NOTE — Progress Notes (Signed)
Office Visit Note   Patient: Justin Townsend           Date of Birth: 1961/11/02           MRN: [id number] Visit Date: 02/12/2019              Requested by: Sharlene Dory, DO [address] [place], [place] [address] PCP: Sharlene Dory, DO   Assessment & Plan: Visit Diagnoses:  1. Synovitis of right knee     Plan: 45 cc aspirated clear yellow fluid taken off the knee with cortisone injection.  He tolerated the injection well.  He will return if he has persistent problems.  He is happy with results of the left quad tendon repair.  Follow-Up Instructions: No follow-ups on file.   Orders:  Orders Placed This Encounter  Procedures  . Large Joint Inj: R knee   No orders of the defined types were placed in this encounter.     Procedures: Large Joint Inj: R knee on 02/12/2019 8:59 AM Indications: pain and joint swelling Details: 22 G 1.5 in needle, superolateral approach  Arthrogram: No  Medications: 4 mL bupivacaine 0.25 %; 0.5 mL lidocaine 1 %; 40 mg methylPREDNISolone acetate 40 MG/ML Aspirate: 45 mL yellow and clear Outcome: tolerated well, no immediate complications Procedure, treatment alternatives, risks and benefits explained, specific risks discussed. Consent was given by the patient. Immediately prior to procedure a time out was called to verify the correct patient, procedure, equipment, support staff and site/side marked as required. Patient was prepped and draped in the usual sterile fashion.       Clinical Data: No additional findings.   Subjective: Chief Complaint  Patient presents with  . Right Knee - Pain  . Left Knee - Pain    HPI 58 year old male returns with increased problems with his right knee is back doing regular work he states the left quad tendon repair from 09/20/2018 is doing well.  Right knee has been swollen has had an effusion he has had difficulty getting from sitting to standing no locking.  No pain over  the distal quad tendon region.  No history of gout.  He denies fever or chills.  Review of Systems 14 system update on changed other than as mentioned above from last office visit.   Objective: Vital Signs: Ht 5\' 11"  (1.803 m)   Wt 253 lb (114.8 kg)   BMI 35.29 kg/m   Physical Exam Constitutional:      Appearance: He is well-developed.  HENT:     Head: Normocephalic and atraumatic.  Eyes:     Pupils: Pupils are equal, round, and reactive to light.  Neck:     Thyroid: No thyromegaly.     Trachea: No tracheal deviation.  Cardiovascular:     Rate and Rhythm: Normal rate.  Pulmonary:     Effort: Pulmonary effort is normal.     Breath sounds: No wheezing.  Abdominal:     General: Bowel sounds are normal.     Palpations: Abdomen is soft.  Skin:    General: Skin is warm and dry.     Capillary Refill: Capillary refill takes less than 2 seconds.  Neurological:     Mental Status: He is alert and oriented to person, place, and time.  Psychiatric:        Behavior: Behavior normal.        Thought Content: Thought content normal.  Judgment: Judgment normal.     Ortho Exam patient has 3+ knee effusion no increased warmth.  Quad patellar tendon is normal he has full extension flexion 130 degrees collateral crucial ligament exam is normal.  Mild tenderness medial lateral joint line.  Minimal discomfort with patellofemoral loading and knee extension.  No Baker's cyst.  Specialty Comments:  No specialty comments available.  Imaging: No results found.   PMFS History: Patient Active Problem List   Diagnosis Date Noted  . Esophageal obstruction due to food impaction   . Quadriceps tendon rupture 09/19/2018  . Rupture of left quadriceps tendon 09/18/2018  . Generalized anxiety disorder 08/17/2016  . Non-compliant behavior 08/12/2015  . SNORING, HX OF 04/29/2010  . DYSPHAGIA UNSPECIFIED 01/13/2009  . HYPERLIPIDEMIA 12/25/2007  . Essential hypertension 12/25/2007  . NONSPEC  ELEVATION OF LEVELS OF TRANSAMINASE/LDH 12/25/2007   Past Medical History:  Diagnosis Date  . Anxiety   . Asthma    only as child; resolved by age 58  . Fatty liver   . Gallstones   . GERD (gastroesophageal reflux disease)   . History of hiatal hernia   . Hyperlipidemia   . Hypertension     Family History  Problem Relation Age of Onset  . Ulcers Mother   . Cirrhosis Mother        ? Alcohol Related   . Cancer Paternal Grandfather        CNS Cancer  . Panic disorder Other        Maternal FH  . Anxiety disorder Other        Maternal FH  . Heart attack Maternal Grandfather        >55  . Diabetes Neg Hx   . Stroke Neg Hx   . Colon cancer Neg Hx   . Colon polyps Neg Hx     Past Surgical History:  Procedure Laterality Date  . COLONOSCOPY  2019  . ESOPHAGOGASTRODUODENOSCOPY (EGD) WITH ESOPHAGEAL DILATION  2019  . ESOPHAGOGASTRODUODENOSCOPY (EGD) WITH PROPOFOL N/A 02/11/2019   Procedure: ESOPHAGOGASTRODUODENOSCOPY (EGD) WITH PROPOFOL;  Surgeon: Iva Boop, MD;  Location: [facility]COPY;  Service: Endoscopy;  Laterality: N/A;  . FOREIGN BODY REMOVAL  02/11/2019   Procedure: FOREIGN BODY REMOVAL;  Surgeon: Iva Boop, MD;  Location: [facility];  Service: Endoscopy;;  food impaction  . no colonoscopy     SOC reviewed  . ORIF TOE FRACTURE Right    foot  . QUADRICEPS TENDON REPAIR Left 09/19/2018   Procedure: REPAIR LEFT KNEE QUADRICEPS TENDON;  Surgeon: Eldred Manges, MD;  Location: [facility] OR;  Service: Orthopedics;  Laterality: Left;  . Thumb Surgery Left    pinning post dislocation   Social History   Occupational History  . Occupation: Air traffic controller: [employer]  Tobacco Use  . Smoking status: Light Tobacco Smoker    Types: Cigars  . Smokeless tobacco: Never Used  . Tobacco comment: rare cigar  Substance and Sexual Activity  . Alcohol use: Yes    Alcohol/week: 10.0 standard drinks    Types: 10 Cans of beer per week    Comment: Socially on  weekend  . Drug use: No  . Sexual activity: Yes    Partners: Female

## 2019-02-13 ENCOUNTER — Other Ambulatory Visit: Payer: Self-pay | Admitting: Family Medicine

## 2019-02-13 DIAGNOSIS — R6889 Other general symptoms and signs: Secondary | ICD-10-CM

## 2019-02-14 ENCOUNTER — Telehealth: Payer: Self-pay

## 2019-02-14 NOTE — Telephone Encounter (Signed)
Pt scheduled for previsit and EGD with dil in the lec. Pts wife aware of appts.

## 2019-02-20 ENCOUNTER — Telehealth: Payer: Self-pay

## 2019-02-20 NOTE — Telephone Encounter (Signed)
Called pt to see if we can do previsit set for 02-25-19 at 8:30 over the phone, left voicemail.

## 2019-02-27 ENCOUNTER — Encounter: Payer: PRIVATE HEALTH INSURANCE | Admitting: Internal Medicine

## 2019-03-20 ENCOUNTER — Encounter: Payer: PRIVATE HEALTH INSURANCE | Admitting: Internal Medicine

## 2019-03-27 ENCOUNTER — Other Ambulatory Visit: Payer: Self-pay | Admitting: Family Medicine

## 2019-04-15 ENCOUNTER — Other Ambulatory Visit: Payer: Self-pay | Admitting: Family Medicine

## 2019-06-10 ENCOUNTER — Other Ambulatory Visit: Payer: Self-pay | Admitting: Family Medicine

## 2019-06-10 DIAGNOSIS — R6889 Other general symptoms and signs: Secondary | ICD-10-CM

## 2019-06-19 ENCOUNTER — Encounter: Payer: Self-pay | Admitting: Medical

## 2019-07-03 ENCOUNTER — Other Ambulatory Visit: Payer: Self-pay | Admitting: Family Medicine

## 2019-07-06 DIAGNOSIS — Z20828 Contact with and (suspected) exposure to other viral communicable diseases: Secondary | ICD-10-CM | POA: Diagnosis not present

## 2019-07-26 ENCOUNTER — Other Ambulatory Visit: Payer: Self-pay | Admitting: Internal Medicine

## 2019-07-26 DIAGNOSIS — K2100 Gastro-esophageal reflux disease with esophagitis, without bleeding: Secondary | ICD-10-CM

## 2019-08-13 ENCOUNTER — Other Ambulatory Visit: Payer: Self-pay | Admitting: Family Medicine

## 2019-09-27 ENCOUNTER — Other Ambulatory Visit: Payer: Self-pay | Admitting: Family Medicine

## 2019-09-27 DIAGNOSIS — E782 Mixed hyperlipidemia: Secondary | ICD-10-CM

## 2019-09-29 ENCOUNTER — Other Ambulatory Visit: Payer: Self-pay | Admitting: Internal Medicine

## 2019-09-29 DIAGNOSIS — K2100 Gastro-esophageal reflux disease with esophagitis, without bleeding: Secondary | ICD-10-CM

## 2019-10-09 ENCOUNTER — Other Ambulatory Visit: Payer: Self-pay | Admitting: Family Medicine

## 2019-10-31 IMAGING — MR MR KNEE*L* W/O CM
8 series · 40 of 40 positions shown · non-contrast
Comparison: Radiographs

CLINICAL DATA: Fell 1 week ago and injured knee. Persistent pain
and swelling.

EXAM:
MRI OF THE LEFT KNEE WITHOUT CONTRAST
TECHNIQUE: Multiplanar, multisequence MR imaging of the knee was performed. No
intravenous contrast was administered.

[Series 3: T2 fat-sat · axial · 4.0mm · 0.62mm/px · z∈[-82,+37]mm · 5 of 25 slices shown (1 of 3)]
[im 1/25]
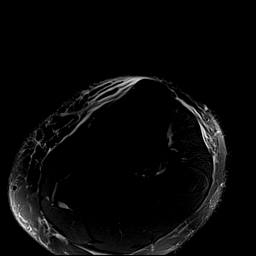
[im 7/25]
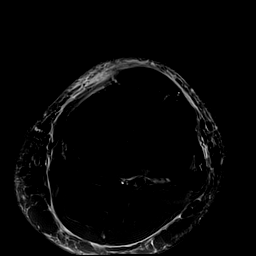
[im 13/25]
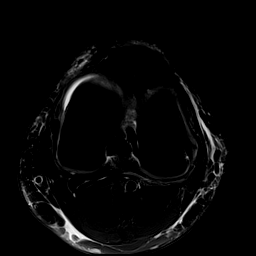
[im 19/25]
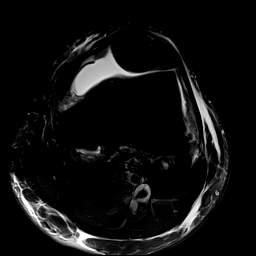
[im 25/25]
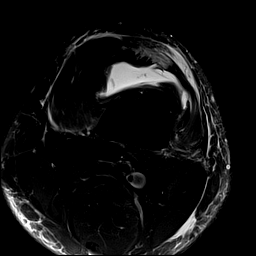

[Series 4: T1 · coronal · 4.0mm · 0.47mm/px · 6 of 25 slices shown]
[im 1/25]
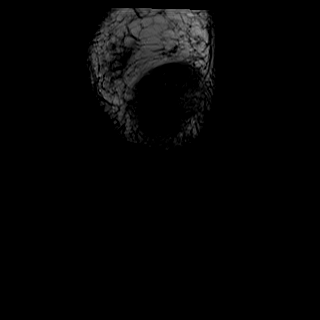
[im 5/25]
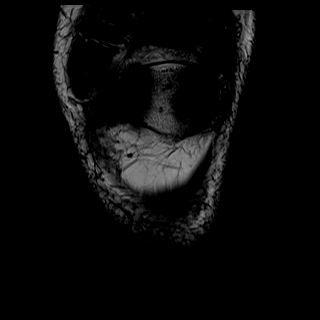
[im 10/25]
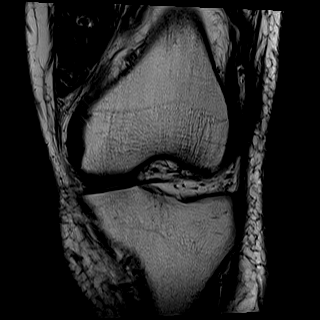
[im 15/25]
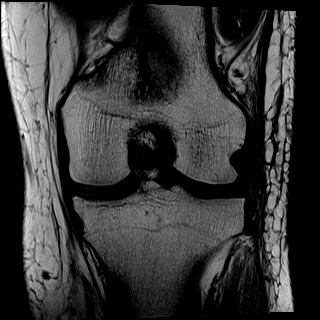
[im 20/25]
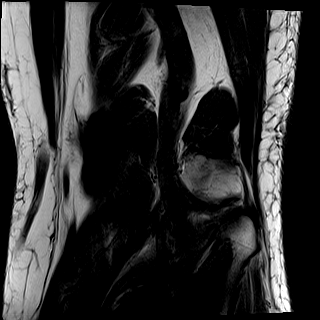
[im 25/25]
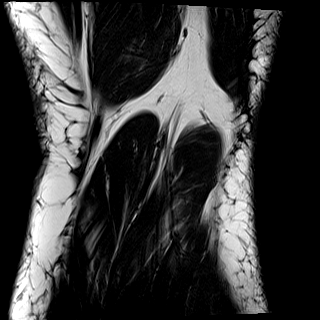

[Series 5: T2 fat-sat · coronal · 4.0mm · 0.59mm/px · 6 of 25 slices shown (2 of 3)]
[im 1/25]
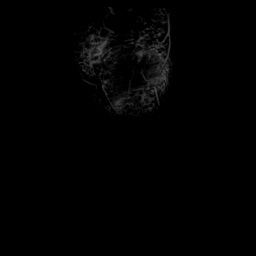
[im 5/25]
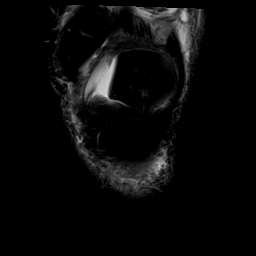
[im 10/25]
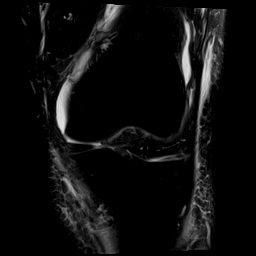
[im 15/25]
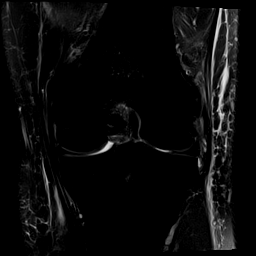
[im 20/25]
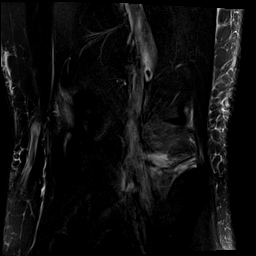
[im 25/25]
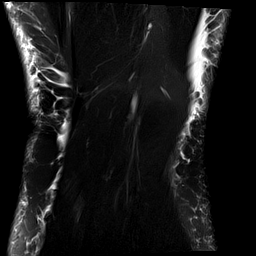

[Series 6: PD fat-sat · coronal · 4.0mm · 0.59mm/px · 6 of 25 slices shown (1 of 2)]
[im 1/25]
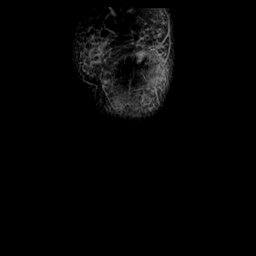
[im 5/25]
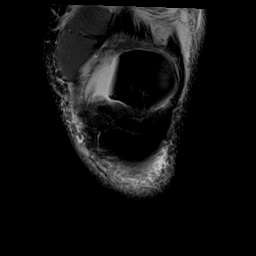
[im 10/25]
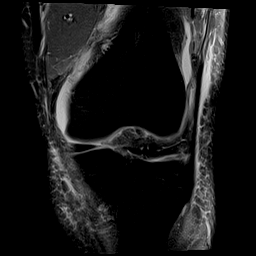
[im 15/25]
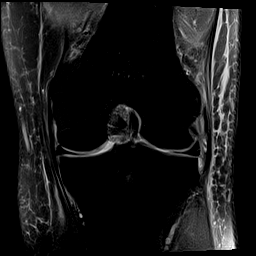
[im 20/25]
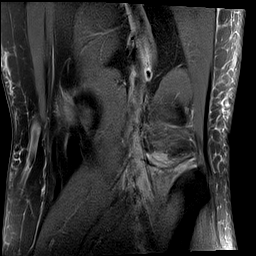
[im 25/25]
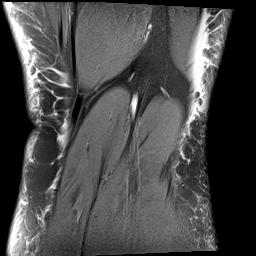

[Series 7: PD fat-sat · sagittal · 3.0mm · 0.59mm/px · 6 of 29 slices shown (2 of 2)]
[im 1/29]
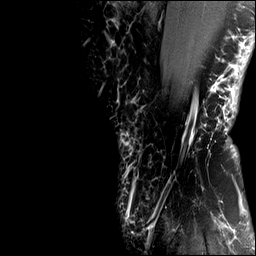
[im 6/29]
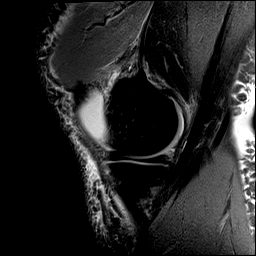
[im 12/29]
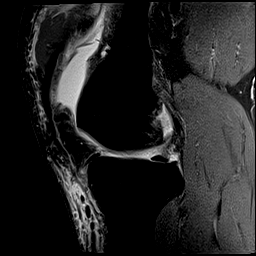
[im 17/29]
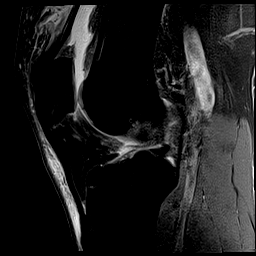
[im 23/29]
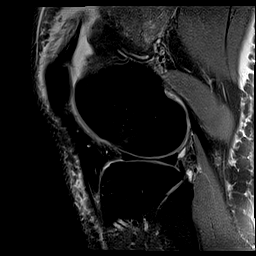
[im 29/29]
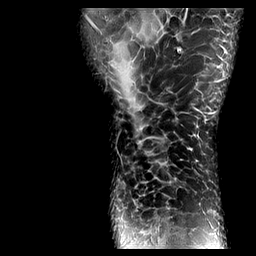

[Series 8: T2 fat-sat · sagittal · 3.0mm · 0.59mm/px · 6 of 29 slices shown (3 of 3)]
[im 1/29]
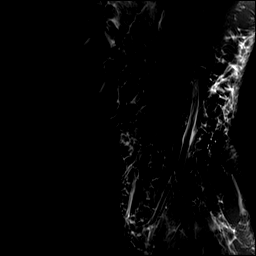
[im 6/29]
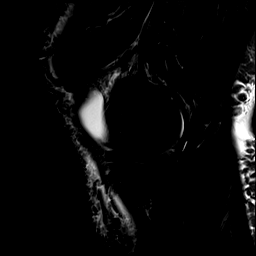
[im 12/29]
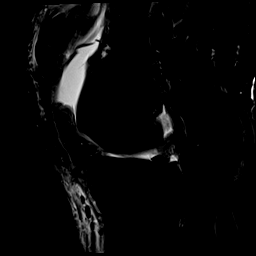
[im 17/29]
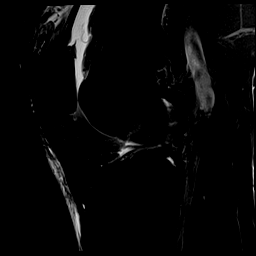
[im 23/29]
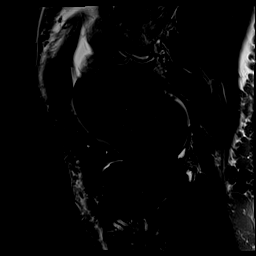
[im 29/29]
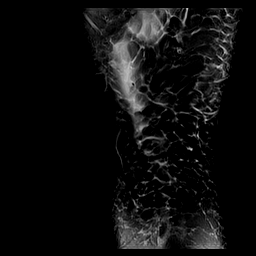

[Series 9: PD · coronal · 2.0mm · 0.47mm/px · 4 of 18 slices shown]
[im 1/18]
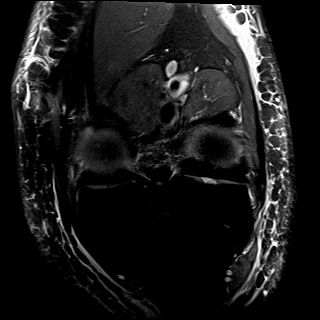
[im 6/18]
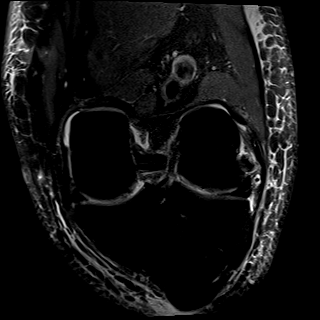
[im 12/18]
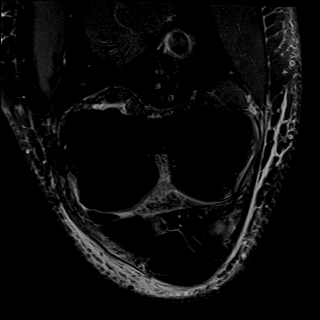
[im 18/18]
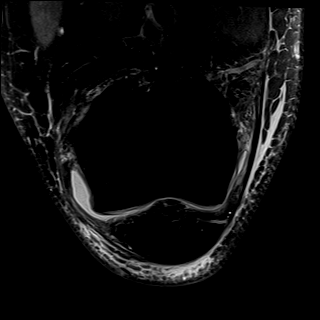

[Series 100: hx · axial · 8.0mm · 0.68mm/px · 1 of 3 slices shown]
[im 1/3]
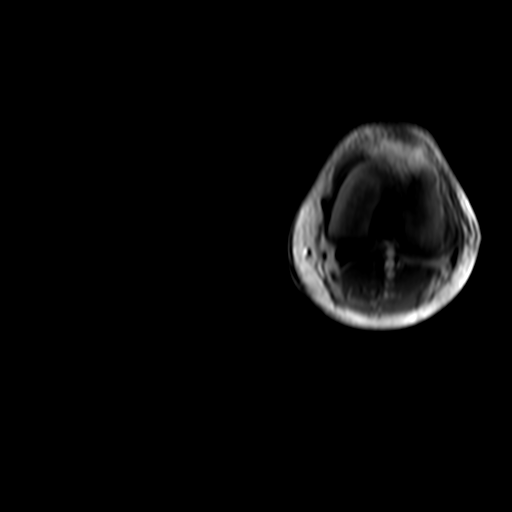

[40 of 40 positions shown; findings below may reference images not displayed]

FINDINGS: MENISCI

Medial meniscus:  Intact

Lateral meniscus:  Intact

LIGAMENTS

Cruciates:  Intact

Collaterals:  Intact

CARTILAGE

Patellofemoral:  Mild degenerative chondrosis.

Medial:  Mild degenerative chondrosis.

Lateral:  Mild to moderate degenerative chondrosis.

Joint:  Moderate-sized joint effusion.

Popliteal Fossa:  No popliteal mass or Baker's cyst.

Extensor Mechanism: Partially torn distal quadriceps tendon. This is
incompletely evaluated but I believe the rectus femoris component
may be torn. There is also at least partial thickness tearing along
the confluence of the vastus lateralis component. I would recommend
a repeat MRI of the distal thigh to help better evaluate the exact
extent of the tear (if clinically necessary). Not sure whether not
this will change management.

The patellar tendon is intact. The patella retinacular structures
are intact.

Bones:  No acute bony findings.

Other: Mild edema in the vastus lateralis muscle.
IMPRESSION: 1. Partial quadriceps tendon tear. If clinically indicated,
recommend dedicated MR imaging of the distal thigh to help better
evaluate the exact extent and components of the tear.
2. The patellar tendon is intact.
3. No findings for internal derangement of the knee.
4. Moderate-sized joint effusion

## 2019-11-26 ENCOUNTER — Encounter: Payer: Self-pay | Admitting: Family Medicine

## 2019-11-26 DIAGNOSIS — K2100 Gastro-esophageal reflux disease with esophagitis, without bleeding: Secondary | ICD-10-CM

## 2019-11-26 MED ORDER — OMEPRAZOLE 40 MG PO CPDR: 40.0000 mg | DELAYED_RELEASE_CAPSULE | Freq: Every day | ORAL | 1 refills | Status: DC

## 2019-12-21 ENCOUNTER — Other Ambulatory Visit: Payer: Self-pay | Admitting: Family Medicine

## 2019-12-21 DIAGNOSIS — E782 Mixed hyperlipidemia: Secondary | ICD-10-CM

## 2019-12-23 ENCOUNTER — Other Ambulatory Visit: Payer: Self-pay | Admitting: Family Medicine

## 2020-01-12 ENCOUNTER — Other Ambulatory Visit: Payer: Self-pay | Admitting: Family Medicine

## 2020-01-24 ENCOUNTER — Other Ambulatory Visit: Payer: Self-pay | Admitting: Family Medicine

## 2020-01-24 DIAGNOSIS — K2100 Gastro-esophageal reflux disease with esophagitis, without bleeding: Secondary | ICD-10-CM

## 2020-01-27 ENCOUNTER — Encounter: Payer: Self-pay | Admitting: Family Medicine

## 2020-02-03 ENCOUNTER — Other Ambulatory Visit: Payer: Self-pay

## 2020-02-04 ENCOUNTER — Encounter: Payer: Self-pay | Admitting: Family Medicine

## 2020-02-04 ENCOUNTER — Ambulatory Visit (INDEPENDENT_AMBULATORY_CARE_PROVIDER_SITE_OTHER): Payer: BC Managed Care – PPO | Admitting: Family Medicine

## 2020-02-04 ENCOUNTER — Other Ambulatory Visit: Payer: Self-pay

## 2020-02-04 VITALS — BP 132/80 | HR 70 | Temp 96.4°F | Ht 71.0 in | Wt 250.0 lb

## 2020-02-04 DIAGNOSIS — Z0000 Encounter for general adult medical examination without abnormal findings: Secondary | ICD-10-CM | POA: Diagnosis not present

## 2020-02-04 DIAGNOSIS — Z125 Encounter for screening for malignant neoplasm of prostate: Secondary | ICD-10-CM | POA: Diagnosis not present

## 2020-02-04 DIAGNOSIS — K2100 Gastro-esophageal reflux disease with esophagitis, without bleeding: Secondary | ICD-10-CM

## 2020-02-04 LAB — LIPID PANEL
Cholesterol: 137 mg/dL (ref 0–200)
HDL: 41.8 mg/dL (ref >39.00)
LDL Cholesterol: 61 mg/dL (ref 0–99)
NonHDL: 95.28
Total CHOL/HDL Ratio: 3
Triglycerides: 172 mg/dL — ABNORMAL HIGH (ref 0.0–149.0)
VLDL: 34.4 mg/dL (ref 0.0–40.0)

## 2020-02-04 LAB — CBC
HCT: 45.6 % (ref 39.0–52.0)
Hemoglobin: 15.4 g/dL (ref 13.0–17.0)
MCHC: 33.8 g/dL (ref 30.0–36.0)
MCV: 93.3 fl (ref 78.0–100.0)
Platelets: 224 10*3/uL (ref 150.0–400.0)
RBC: 4.88 Mil/uL (ref 4.22–5.81)
RDW: 13.4 % (ref 11.5–15.5)
WBC: 6.4 10*3/uL (ref 4.0–10.5)

## 2020-02-04 LAB — COMPREHENSIVE METABOLIC PANEL
ALT: 46 U/L (ref 0–53)
AST: 34 U/L (ref 0–37)
Albumin: 4 g/dL (ref 3.5–5.2)
Alkaline Phosphatase: 77 U/L (ref 39–117)
BUN: 17 mg/dL (ref 6–23)
CO2: 23 mEq/L (ref 19–32)
Calcium: 9.1 mg/dL (ref 8.4–10.5)
Chloride: 107 mEq/L (ref 96–112)
Creatinine, Ser: 0.8 mg/dL (ref 0.40–1.50)
GFR: 99.17 mL/min (ref >60.00)
Glucose, Bld: 110 mg/dL — ABNORMAL HIGH (ref 70–99)
Potassium: 4.1 mEq/L (ref 3.5–5.1)
Sodium: 139 mEq/L (ref 135–145)
Total Bilirubin: 0.4 mg/dL (ref 0.2–1.2)
Total Protein: 6.8 g/dL (ref 6.0–8.3)

## 2020-02-04 LAB — PSA: PSA: 0.77 ng/mL (ref 0.10–4.00)

## 2020-02-04 MED ORDER — OMEPRAZOLE 40 MG PO CPDR: DELAYED_RELEASE_CAPSULE | ORAL | 2 refills | Status: DC

## 2020-02-04 MED ORDER — AMLODIPINE BESYLATE 5 MG PO TABS: 5.0000 mg | ORAL_TABLET | Freq: Every day | ORAL | 2 refills | Status: DC

## 2020-02-04 NOTE — Patient Instructions (Addendum)

## 2020-02-04 NOTE — Progress Notes (Signed)
Chief Complaint  Patient presents with  . Annual Exam    Well Male Justin Townsend is here for a complete physical.   His last physical was >1 year ago.  Current diet: in general, a "healthy" diet.  Current exercise: active Weight trend: stable Fatigue: No Seat belt? Yes.    Health maintenance Shingrix- No Colonoscopy- Yes Tetanus- Yes HIV- Yes Hep C- Yes   Past Medical History:  Diagnosis Date  . Anxiety   . Asthma    only as child; resolved by age 59  . Fatty liver   . Gallstones   . GERD (gastroesophageal reflux disease)   . History of hiatal hernia   . Hyperlipidemia   . Hypertension       Past Surgical History:  Procedure Laterality Date  . COLONOSCOPY  2019  . ESOPHAGOGASTRODUODENOSCOPY (EGD) WITH ESOPHAGEAL DILATION  2019  . ESOPHAGOGASTRODUODENOSCOPY (EGD) WITH PROPOFOL N/A 02/11/2019   Procedure: ESOPHAGOGASTRODUODENOSCOPY (EGD) WITH PROPOFOL;  Surgeon: Iva Boop, MD;  Location: [facility];  Service: Endoscopy;  Laterality: N/A;  . FOREIGN BODY REMOVAL  02/11/2019   Procedure: FOREIGN BODY REMOVAL;  Surgeon: Iva Boop, MD;  Location: [facility];  Service: Endoscopy;;  food impaction  . no colonoscopy     SOC reviewed  . ORIF TOE FRACTURE Right    foot  . QUADRICEPS TENDON REPAIR Left 09/19/2018   Procedure: REPAIR LEFT KNEE QUADRICEPS TENDON;  Surgeon: Eldred Manges, MD;  Location: [facility];  Service: Orthopedics;  Laterality: Left;  . Thumb Surgery Left    pinning post dislocation    Medications  Current Outpatient Medications on File Prior to Visit  Medication Sig Dispense Refill  . ezetimibe (ZETIA) 10 MG tablet TAKE 1 TABLET BY MOUTH EVERY DAY 90 tablet 0  . FLUoxetine (PROZAC) 10 MG tablet Take 1 tablet by mouth once daily 90 tablet 0  . ibuprofen (ADVIL) 800 MG tablet TAKE 1 TABLET BY MOUTH EVERY 8 HOURS AS NEEDED 30 tablet 0  . lisinopril (ZESTRIL) 10 MG tablet TAKE 2 TABLETS BY MOUTH EVERY DAY 60 tablet 3  . Multiple  Vitamins-Minerals (MULTIVITAMIN PO) Take 1 tablet by mouth daily.     . Omega-3 Fatty Acids (FISH OIL ULTRA) 1400 MG CAPS Take 2,800 mg by mouth daily.    Marland Kitchen omeprazole (PRILOSEC) 40 MG capsule TAKE 1 CAPSULE BY MOUTH EVERY DAY *NEED OFFICE VISIT 30 capsule 1  . amLODipine (NORVASC) 5 MG tablet 1-2 tab po q day 60 tablet 3   Allergies No Known Allergies  Family History Family History  Problem Relation Age of Onset  . Ulcers Mother   . Cirrhosis Mother        ? Alcohol Related   . Cancer Paternal Grandfather        CNS Cancer  . Panic disorder Other        Maternal FH  . Anxiety disorder Other        Maternal FH  . Heart attack Maternal Grandfather        >55  . Diabetes Neg Hx   . Stroke Neg Hx   . Colon cancer Neg Hx   . Colon polyps Neg Hx     Review of Systems: Constitutional:  no fevers Eye:  no recent significant change in vision Ear/Nose/Mouth/Throat:  Ears:  no hearing loss Nose/Mouth/Throat:  no complaints of nasal congestion, no sore throat Cardiovascular:  no chest pain, no palpitations Respiratory:  no cough and no shortness  of breath Gastrointestinal:  no abdominal pain, no change in bowel habits GU:  Male: negative for dysuria and incontinence and +freq a little compared to las tyear Musculoskeletal/Extremities:  no pain, redness, or swelling of the joints Integumentary (Skin/Breast):  no abnormal skin lesions reported Neurologic:  no headaches Endocrine: No unexpected weight changes Hematologic/Lymphatic:  no abnormal bleeding   Exam BP 132/80 (BP Location: Left Arm, Patient Position: Sitting, Cuff Size: Normal)   Pulse 70   Temp (!) 96.4 F (35.8 C) (Temporal)   Ht 5\' 11"  (1.803 m)   Wt 250 lb (113.4 kg)   SpO2 95%   BMI 34.87 kg/m  General:  well developed, well nourished, in no apparent distress Skin:  no significant moles, warts, or growths Head:  no masses, lesions, or tenderness Eyes:  pupils equal and round, sclera anicteric without  injection Ears:  canals without lesions, TMs shiny without retraction, no obvious effusion, no erythema Nose:  nares patent, septum midline, mucosa normal Throat/Pharynx:  lips and gingiva without lesion; tongue and uvula midline; non-inflamed pharynx; no exudates or postnasal drainage Neck: neck supple without adenopathy, thyromegaly, or masses Cardiac: RRR, no bruits, no LE edema Lungs:  clear to auscultation, breath sounds equal bilaterally, no respiratory distress Rectal: Deferred Musculoskeletal:  symmetrical muscle groups noted without atrophy or deformity Neuro:  gait normal; deep tendon reflexes normal and symmetric Psych: well oriented with normal range of affect and appropriate judgment/insight  Assessment and Plan  Well adult exam - Plan: CBC, Comprehensive metabolic panel, Lipid panel  Screening for prostate cancer - Plan: PSA  Reflux esophagitis - Plan: omeprazole (PRILOSEC) 40 MG capsule   Well 59 y.o. male. Counseled on diet and exercise. Counseled on risks and benefits of prostate cancer screening with PSA. The patient agrees to undergo testing. Immunizations, labs, and further orders as above. Follow up in 6 mo for a med ck. The patient voiced understanding and agreement to the plan.  [place], DO 02/04/20 7:57 AM

## 2020-02-05 ENCOUNTER — Other Ambulatory Visit: Payer: BC Managed Care – PPO

## 2020-02-05 ENCOUNTER — Encounter: Payer: Self-pay | Admitting: Family Medicine

## 2020-02-05 ENCOUNTER — Other Ambulatory Visit: Payer: Self-pay | Admitting: Family Medicine

## 2020-02-05 ENCOUNTER — Other Ambulatory Visit (INDEPENDENT_AMBULATORY_CARE_PROVIDER_SITE_OTHER): Payer: BC Managed Care – PPO

## 2020-02-05 DIAGNOSIS — E782 Mixed hyperlipidemia: Secondary | ICD-10-CM

## 2020-02-05 DIAGNOSIS — R739 Hyperglycemia, unspecified: Secondary | ICD-10-CM | POA: Diagnosis not present

## 2020-02-05 LAB — HEMOGLOBIN A1C: Hgb A1c MFr Bld: 5.7 % (ref 4.6–6.5)

## 2020-02-05 NOTE — Addendum Note (Signed)
Addended by: Miguel Aschoff on: 02/05/2020 11:47 AM   Modules accepted: Orders

## 2020-02-14 ENCOUNTER — Other Ambulatory Visit: Payer: Self-pay | Admitting: Family Medicine

## 2020-02-19 ENCOUNTER — Encounter: Payer: PRIVATE HEALTH INSURANCE | Admitting: Family Medicine

## 2020-03-18 ENCOUNTER — Other Ambulatory Visit: Payer: Self-pay

## 2020-03-18 ENCOUNTER — Other Ambulatory Visit (INDEPENDENT_AMBULATORY_CARE_PROVIDER_SITE_OTHER): Payer: BC Managed Care – PPO

## 2020-03-18 DIAGNOSIS — E782 Mixed hyperlipidemia: Secondary | ICD-10-CM

## 2020-03-18 LAB — LIPID PANEL
Cholesterol: 131 mg/dL (ref 0–200)
HDL: 43.8 mg/dL (ref >39.00)
LDL Cholesterol: 67 mg/dL (ref 0–99)
NonHDL: 87.31
Total CHOL/HDL Ratio: 3
Triglycerides: 104 mg/dL (ref 0.0–149.0)
VLDL: 20.8 mg/dL (ref 0.0–40.0)

## 2020-03-20 ENCOUNTER — Other Ambulatory Visit: Payer: Self-pay | Admitting: Family Medicine

## 2020-03-20 DIAGNOSIS — E782 Mixed hyperlipidemia: Secondary | ICD-10-CM

## 2020-05-10 ENCOUNTER — Other Ambulatory Visit: Payer: Self-pay | Admitting: Family Medicine

## 2020-05-15 ENCOUNTER — Other Ambulatory Visit: Payer: Self-pay | Admitting: Family Medicine

## 2020-07-03 ENCOUNTER — Other Ambulatory Visit: Payer: Self-pay | Admitting: Family Medicine

## 2020-07-03 DIAGNOSIS — E782 Mixed hyperlipidemia: Secondary | ICD-10-CM

## 2020-07-15 DIAGNOSIS — K56699 Other intestinal obstruction unspecified as to partial versus complete obstruction: Secondary | ICD-10-CM | POA: Diagnosis not present

## 2020-07-15 DIAGNOSIS — T18128A Food in esophagus causing other injury, initial encounter: Secondary | ICD-10-CM | POA: Diagnosis not present

## 2020-07-15 DIAGNOSIS — X58XXXA Exposure to other specified factors, initial encounter: Secondary | ICD-10-CM | POA: Diagnosis not present

## 2020-07-17 ENCOUNTER — Telehealth: Payer: Self-pay | Admitting: Family Medicine

## 2020-07-17 NOTE — Telephone Encounter (Signed)
Recv'd records from [facility] forwarded 4 pages to Dr. Arva Chafe 8/27/21fbg

## 2020-07-20 ENCOUNTER — Other Ambulatory Visit: Payer: Self-pay | Admitting: Family Medicine

## 2020-07-20 DIAGNOSIS — R1310 Dysphagia, unspecified: Secondary | ICD-10-CM

## 2020-07-21 ENCOUNTER — Encounter: Payer: Self-pay | Admitting: Family Medicine

## 2020-08-03 ENCOUNTER — Encounter: Payer: Self-pay | Admitting: Family Medicine

## 2020-08-03 ENCOUNTER — Other Ambulatory Visit: Payer: Self-pay

## 2020-08-03 ENCOUNTER — Ambulatory Visit (INDEPENDENT_AMBULATORY_CARE_PROVIDER_SITE_OTHER): Payer: BC Managed Care – PPO | Admitting: Family Medicine

## 2020-08-03 VITALS — BP 134/86 | HR 61 | Temp 98.1°F | Ht 71.0 in | Wt 240.0 lb

## 2020-08-03 DIAGNOSIS — F411 Generalized anxiety disorder: Secondary | ICD-10-CM | POA: Diagnosis not present

## 2020-08-03 DIAGNOSIS — E782 Mixed hyperlipidemia: Secondary | ICD-10-CM

## 2020-08-03 DIAGNOSIS — I10 Essential (primary) hypertension: Secondary | ICD-10-CM | POA: Diagnosis not present

## 2020-08-03 DIAGNOSIS — S76111A Strain of right quadriceps muscle, fascia and tendon, initial encounter: Secondary | ICD-10-CM

## 2020-08-03 NOTE — Patient Instructions (Addendum)
Give [place] 2-3 business days to get the results of your labs back.   Keep the diet clean and stay active.  I recommend getting the flu shot in mid October. This suggestion would change if the CDC comes out with a different recommendation.   I recommend getting the covid vaccination.  Let us know if you need anything.  Quadriceps Strain Rehab It is normal to feel mild stretching, pulling, tightness, or discomfort as you do these exercises, but you should stop right away if you feel sudden pain or your pain gets worse. Stretching and range of motion exercises These exercises warm up your muscles and joints and improve the movement and flexibility of your thigh. These exercises can also help to relieve stiffness or swelling. Exercise A: Heel slides   1. Lie on your back with both knees straight. If this causes back discomfort, bend the knee of your healthy leg, placing your foot flat on the floor. 2. Slowly slide your left / right heel back toward your buttocks until you feel a gentle stretch in the front of your knee or thigh. 3. Hold for 30 seconds. Then slowly slide your heel back to the starting position. Repeat 2 times. Complete this exercise 3 times a week. Exercise B: Quadriceps stretch, prone   1. Lie on your abdomen on a firm surface, such as a bed or padded floor. 2. Bend your left / right knee and hold your ankle. If you cannot reach your ankle or pant leg, loop a belt around your foot and grab the belt instead. 3. Gently pull your heel toward your buttocks. Your knee should not slide out to the side. You should feel a stretch in the front of your thigh and knee. 4. Hold this position for 30 seconds. Repeat 2 times. Complete this exercise 3 times a week. Strengthening exercises These exercises build strength and endurance in your thigh. Endurance is the ability to use your muscles for a long time, even after your muscles get tired. Exercise C: Straight leg raises (quadriceps and  hip flexors) Quality counts! Watch for signs that the quadriceps muscle is working to ensure that you are strengthening the correct muscles and not cheating by using healthier muscles. 1. Lie on your back with your left / right leg extended and your other knee bent. 2. Tense the muscles in the front of your left / right thigh. You should see your kneecap slide up or see increased dimpling just above the knee. 3. Tighten these muscles even more and raise your leg 4-6 inches (10-15 cm) off the floor. 4. Hold for 3 seconds. 5. Keep the thigh muscles tense as you lower your leg. 6. Relax the muscles slowly and completely after each repetition. Repeat 2 times. Complete this exercise 3 times a week. Exercise D: Straight leg raises (hip extensors) 1. Lie on your belly on a bed or a firm surface with a pillow under your hips. 2. Bend your left / right knee so your foot is straight up in the air. 3. Tense your buttock muscles and lift your left / right thigh off the bed. Do not let your back arch. 4. Hold this position for 3 seconds. 5. Slowly return to the starting position. Let your muscles relax completely before doing another repetition. Repeat 2 times. Complete this exercise 3 times a week. Exercise E: Wall sits   Follow the directions for form closely. If you do not place your feet and knees properly, this can lead  to knee pain. 1. Lean back against a smooth wall or door and walk your feet out 18-24 inches (46-61 cm) from it. Place your feet hip-width apart. 2. Slowly slide down the wall or door until your knees bend  60-90 degrees. Keep your weight back and over your heels, not over your toes. Keep your thighs straight or pointing slightly outward. 3. Hold for 1 second. 4. Use your thigh and buttock muscles to push you back up to a standing position. Keep your weight through your heels while you do this. 5. Rest for 5 seconds in between repetitions. Repeat 2 times. Complete this exercise 3  times a week. Make sure you discuss any questions you have with your health care provider. Document Released: 11/07/2005 Document Revised: 07/14/2016 Document Reviewed: 08/11/2015 Elsevier Interactive Patient Education  [employer].

## 2020-08-03 NOTE — Progress Notes (Signed)
CC: F/u   Subjective Justin Townsend presents for f/u anxiety.  Pt is currently being treated with Prozac 10 mg/d.  Reports doing well since treatment. No thoughts of harming self or others. No self-medication with alcohol, prescription drugs or illicit drugs. Pt is not following with a counselor/psychologist.  Hypertension Patient presents for hypertension follow up. He does monitor home blood pressures. He is compliant with medication- lisinopril 20 mg/d. Patient has these side effects of medication: none He is adhering to a healthy diet overall. Exercise: active at work  Hyperlipidemia Patient presents for dyslipidemia follow up. Currently being treated with Zetia 10 mg/d and compliance with treatment thus far has been good. He denies myalgias. The patient is not known to have coexisting coronary artery disease.  R hip pain R anterior hip pain for past 1-2 mo.  No inj. Had his quad repaired on L, unsure if this caused it. Has been using 800 mg ibuprofen with some relief. No neurologic s/s's.   Past Medical History:  Diagnosis Date   Anxiety    Asthma    only as child; resolved by age 81   Fatty liver    Gallstones    GERD (gastroesophageal reflux disease)    History of hiatal hernia    Hyperlipidemia    Hypertension    Allergies as of 08/03/2020   No Known Allergies     Medication List       Accurate as of August 03, 2020  7:34 AM. If you have any questions, ask your nurse or doctor.        STOP taking these medications   amLODipine 5 MG tablet Commonly known as: NORVASC Stopped by: Sharlene Dory, DO     TAKE these medications   ezetimibe 10 MG tablet Commonly known as: ZETIA TAKE 1 TABLET BY MOUTH EVERY DAY   Fish Oil Ultra 1400 MG Caps Take 2,800 mg by mouth daily.   FLUoxetine 10 MG tablet Commonly known as: PROZAC Take 1 tablet (10 mg total) by mouth daily.   lisinopril 10 MG tablet Commonly known as: ZESTRIL TAKE 2 TABLETS  BY MOUTH EVERY DAY   MULTIVITAMIN PO Take 1 tablet by mouth daily.   omeprazole 40 MG capsule Commonly known as: PRILOSEC TAKE 1 CAPSULE BY MOUTH EVERY DAY       Exam BP 134/86 (BP Location: Left Arm, Patient Position: Sitting, Cuff Size: Normal)    Pulse 61    Temp 98.1 F (36.7 C) (Oral)    Ht 5\' 11"  (1.803 m)    Wt 240 lb (108.9 kg)    SpO2 97%    BMI 33.47 kg/m  General:  well developed, well nourished, in no apparent distress Heart: RRR, no bruits, no LE edema Lungs: CTAB. No respiratory distress MSK: Neg log roll, +ttp over the lateral quad on R Neuro: Gait nml Psych: well oriented with normal range of affect and age-appropriate judgement/insight, alert and oriented x4.  Assessment and Plan  Generalized anxiety disorder  Essential hypertension  HYPERLIPIDEMIA - Plan: Comprehensive metabolic panel, Lipid panel  Quadriceps strain, right, initial encounter  1. Cont Prozac. 2. Cont lisinopril.  3. Cont Zetia. Decided not to go on statin due to personal hx of NAFLD and also mom's hx of cirrhosis. Will see what labs are. He drank yesterday, I would like to avoid labs today as this could falsely elevate his LFT's.  4. Stretches/exercises. If no improvement in 3-4 weeks, he will let know and  we will consider PT.  Encouraged flu shot in mid Oct.  Recommended he get the covid vaccination. He did not have any specific questions today, but I encouraged him to reach out to me if he did have any questions or concerns.  F/u in 6 mo for CPE or prn. The patient voiced understanding and agreement to the plan.  Jilda Roche [place], DO 08/03/20 7:34 AM

## 2020-08-27 ENCOUNTER — Ambulatory Visit (INDEPENDENT_AMBULATORY_CARE_PROVIDER_SITE_OTHER): Payer: BC Managed Care – PPO | Admitting: Nurse Practitioner

## 2020-08-27 ENCOUNTER — Encounter: Payer: Self-pay | Admitting: Nurse Practitioner

## 2020-08-27 VITALS — BP 140/88 | HR 71 | Ht 71.0 in | Wt 245.0 lb

## 2020-08-27 DIAGNOSIS — Z8719 Personal history of other diseases of the digestive system: Secondary | ICD-10-CM | POA: Diagnosis not present

## 2020-08-27 DIAGNOSIS — T18128D Food in esophagus causing other injury, subsequent encounter: Secondary | ICD-10-CM

## 2020-08-27 NOTE — Progress Notes (Signed)
ASSESSMENT AND PLAN    # GERD complicated by esophageal stricture 2019  / food impactions in March 2020 and August 2021 --Didn't return for follow up after first food impaction in 2020. The food impaction at [facility] in August was likely secondary to recurrent esophageal stricture.  --Currently not having any problems swallowing but needs follow up EGD to evaluate / treat probable stricture. The risks and benefits of EGD were discussed and the patient agrees to proceed.  --Continue daily PPI.    HISTORY OF PRESENT ILLNESS     Primary Gastroenterologist : Yancey Flemings, MD    Chief Complaint : recent food impaction.  Justin Townsend is a 59 y.o. male with PMH / PSH significant for,  but not necessarily limited to: GERD / esophageal stricture, HTN, hyperlipidemia.   Patient has a history of dysphagia / esophageal stricture requiring dilation in August 2019. In March 2020 he presented to the ED with a food impaction. He underwent EGD with removal of a food bolus but didn't return for follow up . He had no further episodes of dysphagia until this past August. In August while in [place] patient got a piece of chicken in his stomach. He had been drinking all day, says he was rushing to eat and probably didn't chew the first bite well enough.  He went to [facility] and had an EGD ( actual report not in Care Everywhere) but says part of the food bolus was removed,  theremainder was pushed into his stomach. He takes prilosec every day as directed but says he has never had any GERD symptoms  Previous Endoscopic Evaluations / Pertinent Studies:          Past Medical History:  Diagnosis Date  . Anxiety   . Asthma    only as child; resolved by age 61  . Fatty liver   . Gallstones   . GERD (gastroesophageal reflux disease)   . History of hiatal hernia   . Hyperlipidemia   . Hypertension      Past Surgical History:  Procedure Laterality Date  . COLONOSCOPY  2019  .  ESOPHAGOGASTRODUODENOSCOPY (EGD) WITH ESOPHAGEAL DILATION  2019  . ESOPHAGOGASTRODUODENOSCOPY (EGD) WITH PROPOFOL N/A 02/11/2019   Procedure: ESOPHAGOGASTRODUODENOSCOPY (EGD) WITH PROPOFOL;  Surgeon: Iva Boop, MD;  Location: [facility];  Service: Endoscopy;  Laterality: N/A;  . FOREIGN BODY REMOVAL  02/11/2019   Procedure: FOREIGN BODY REMOVAL;  Surgeon: Iva Boop, MD;  Location: [facility];  Service: Endoscopy;;  food impaction  . no colonoscopy     SOC reviewed  . ORIF TOE FRACTURE Right    foot  . QUADRICEPS TENDON REPAIR Left 09/19/2018   Procedure: REPAIR LEFT KNEE QUADRICEPS TENDON;  Surgeon: Eldred Manges, MD;  Location: [facility];  Service: Orthopedics;  Laterality: Left;  . Thumb Surgery Left    pinning post dislocation   Family History  Problem Relation Age of Onset  . Ulcers Mother   . Cirrhosis Mother        ? Alcohol Related/Hep C   . Cancer Paternal Grandfather        CNS Cancer  . Panic disorder Other        Maternal FH  . Anxiety disorder Other        Maternal FH  . Heart attack Maternal Grandfather        >55  . Diabetes Neg Hx   . Stroke Neg Hx   .  Colon cancer Neg Hx   . Colon polyps Neg Hx   . Esophageal cancer Neg Hx   . Pancreatic cancer Neg Hx   . Stomach cancer Neg Hx    Social History   Tobacco Use  . Smoking status: Light Tobacco Smoker    Types: Cigars  . Smokeless tobacco: Never Used  . Tobacco comment: rare cigar  Vaping Use  . Vaping Use: Never used  Substance Use Topics  . Alcohol use: Yes    Alcohol/week: 10.0 standard drinks    Types: 10 Cans of beer per week    Comment: Socially on weekend  . Drug use: No   Current Outpatient Medications  Medication Sig Dispense Refill  . ezetimibe (ZETIA) 10 MG tablet TAKE 1 TABLET BY MOUTH EVERY DAY 90 tablet 0  . FLUoxetine (PROZAC) 10 MG tablet Take 1 tablet (10 mg total) by mouth daily. 90 tablet 1  . lisinopril (ZESTRIL) 10 MG tablet TAKE 2 TABLETS BY MOUTH EVERY DAY 180 tablet  1  . Multiple Vitamins-Minerals (MULTIVITAMIN PO) Take 1 tablet by mouth daily.     . Omega-3 Fatty Acids (FISH OIL ULTRA) 1400 MG CAPS Take 2,800 mg by mouth daily.    Marland Kitchen omeprazole (PRILOSEC) 40 MG capsule TAKE 1 CAPSULE BY MOUTH EVERY DAY 90 capsule 2   No current facility-administered medications for this visit.   No Known Allergies   Review of Systems: No chest pain, no shortness of breath.    PHYSICAL EXAM :    Wt Readings from Last 3 Encounters:  08/27/20 245 lb (111.1 kg)  08/03/20 240 lb (108.9 kg)  02/04/20 250 lb (113.4 kg)    BP 140/88   Pulse 71   Ht 5\' 11"  (1.803 m)   Wt 245 lb (111.1 kg)   SpO2 98%   BMI 34.17 kg/m  Constitutional:  Pleasant male in no acute distress. Psychiatric: Normal mood and affect. Behavior is normal. EENT: Pupils normal.  Conjunctivae are normal. No scleral icterus. Neck supple.  Cardiovascular: Normal rate, regular rhythm. No edema Pulmonary/chest: Effort normal and breath sounds normal. No wheezing, rales or rhonchi. Abdominal: Soft, nondistended, nontender. Bowel sounds active throughout. There are no masses palpable. No hepatomegaly. Neurological: Alert and oriented to person place and time. Skin: Skin is warm and dry. No rashes noted.  , NP  08/27/2020, 2:33 PM  Cc:  10/27/2020*

## 2020-08-27 NOTE — Patient Instructions (Signed)
If you are age 59 or older, your body mass index should be between 23-30. Your Body mass index is 34.17 kg/m. If this is out of the aforementioned range listed, please consider follow up with your Primary Care Provider.  If you are age 18 or younger, your body mass index should be between 19-25. Your Body mass index is 34.17 kg/m. If this is out of the aformentioned range listed, please consider follow up with your Primary Care Provider.   You have been scheduled for an endoscopy. Please follow written instructions given to you at your visit today. If you use inhalers (even only as needed), please bring them with you on the day of your procedure.  Due to recent changes in healthcare laws, you may see the results of your imaging and laboratory studies on MyChart before your provider has had a chance to review them.  We understand that in some cases there may be results that are confusing or concerning to you. Not all laboratory results come back in the same time frame and the provider may be waiting for multiple results in order to interpret others.  Please give [place] 48 hours in order for your provider to thoroughly review all the results before contacting the office for clarification of your results.

## 2020-08-28 NOTE — Progress Notes (Signed)
Noted  

## 2020-09-15 ENCOUNTER — Other Ambulatory Visit: Payer: Self-pay | Admitting: Internal Medicine

## 2020-09-15 DIAGNOSIS — Z1159 Encounter for screening for other viral diseases: Secondary | ICD-10-CM | POA: Diagnosis not present

## 2020-09-15 LAB — SARS CORONAVIRUS 2 (TAT 6-24 HRS): SARS Coronavirus 2: NEGATIVE

## 2020-09-17 ENCOUNTER — Encounter: Payer: Self-pay | Admitting: Internal Medicine

## 2020-09-17 ENCOUNTER — Ambulatory Visit (AMBULATORY_SURGERY_CENTER): Payer: BC Managed Care – PPO | Admitting: Internal Medicine

## 2020-09-17 ENCOUNTER — Other Ambulatory Visit: Payer: Self-pay

## 2020-09-17 VITALS — BP 141/81 | HR 71 | Temp 98.4°F | Resp 17 | Ht 71.0 in | Wt 245.0 lb

## 2020-09-17 DIAGNOSIS — K449 Diaphragmatic hernia without obstruction or gangrene: Secondary | ICD-10-CM | POA: Diagnosis not present

## 2020-09-17 DIAGNOSIS — T18128D Food in esophagus causing other injury, subsequent encounter: Secondary | ICD-10-CM

## 2020-09-17 DIAGNOSIS — K229 Disease of esophagus, unspecified: Secondary | ICD-10-CM | POA: Diagnosis not present

## 2020-09-17 DIAGNOSIS — K222 Esophageal obstruction: Secondary | ICD-10-CM | POA: Diagnosis not present

## 2020-09-17 DIAGNOSIS — Z8719 Personal history of other diseases of the digestive system: Secondary | ICD-10-CM

## 2020-09-17 DIAGNOSIS — R1310 Dysphagia, unspecified: Secondary | ICD-10-CM

## 2020-09-17 MED ORDER — SODIUM CHLORIDE 0.9 % IV SOLN: 500.0000 mL | Freq: Once | INTRAVENOUS | Status: DC

## 2020-09-17 NOTE — Patient Instructions (Signed)
Read all of the handouts given you by your recovery room nurse.  Follow the food directions.  Take your medication as ordered.  Thank-you for choosing [place] for your healthcare needs today.  YOU HAD AN ENDOSCOPIC PROCEDURE TODAY AT THE [facility]:   Refer to the procedure report that was given to you for any specific questions about what was found during the examination.  If the procedure report does not answer your questions, please call your gastroenterologist to clarify.  If you requested that your care partner not be given the details of your procedure findings, then the procedure report has been included in a sealed envelope for you to review at your convenience later.  YOU SHOULD EXPECT: Some feelings of bloating in the abdomen. Passage of more gas than usual.  Walking can help get rid of the air that was put into your GI tract during the procedure and reduce the bloating.  Please Note:  You might notice some irritation and congestion in your nose or some drainage.  This is from the oxygen used during your procedure.  There is no need for concern and it should clear up in a day or so.  SYMPTOMS TO REPORT IMMEDIATELY:    Following upper endoscopy (EGD)  Vomiting of blood or coffee ground material  New chest pain or pain under the shoulder blades  Painful or persistently difficult swallowing  New shortness of breath  Fever of 100F or higher  Black, tarry-looking stools  For urgent or emergent issues, a gastroenterologist can be reached at any hour by calling [phone]. Do not use MyChart messaging for urgent concerns.    DIET:  We do recommend nothing by mouth until 1:15 pm.  From 1:15-2:15 you may have clear liquids.  For the rest of the day, you may have a soft diet..  No alcohol.  Take your medication as ordered.  ACTIVITY:  You should plan to take it easy for the rest of today and you should NOT DRIVE or use heavy machinery until tomorrow (because of the sedation  medicines used during the test).    FOLLOW UP: Our staff will call the number listed on your records 48-72 hours following your procedure to check on you and address any questions or concerns that you may have regarding the information given to you following your procedure. If we do not reach you, we will leave a message.  We will attempt to reach you two times.  During this call, we will ask if you have developed any symptoms of COVID 19. If you develop any symptoms (ie: fever, flu-like symptoms, shortness of breath, cough etc.) before then, please call [phone].  If you test positive for Covid 19 in the 2 weeks post procedure, please call and report this information to [place].     SIGNATURES/CONFIDENTIALITY: You and/or your care partner have signed paperwork which will be entered into your electronic medical record.  These signatures attest to the fact that that the information above on your After Visit Summary has been reviewed and is understood.  Full responsibility of the confidentiality of this discharge information lies with you and/or your care-partner.

## 2020-09-17 NOTE — Progress Notes (Signed)
Called to room to assist during endoscopic procedure.  Patient ID and intended procedure confirmed with present staff. Received instructions for my participation in the procedure from the performing physician.  

## 2020-09-17 NOTE — Op Note (Signed)
[facility] Patient Name: Justin Townsend Procedure Date: 09/17/2020 10:51 AM MRN: [id number] Endoscopist: Wilhemina Bonito. Marina Goodell MD, MD Age: 59 Referring MD:  Date of Birth: 06/12/1961 Gender: Male Account #: [id number] Procedure:                Upper GI endoscopy with [facility] dilation of the                            esophagus. [address] [place] Indications:              Dysphagia, Therapeutic procedure Medicines:                Monitored Anesthesia Care Procedure:                Pre-Anesthesia Assessment:                           - Prior to the procedure, a History and Physical                            was performed, and patient medications and                            allergies were reviewed. The patient's tolerance of                            previous anesthesia was also reviewed. The risks                            and benefits of the procedure and the sedation                            options and risks were discussed with the patient.                            All questions were answered, and informed consent                            was obtained. Prior Anticoagulants: The patient has                            taken no previous anticoagulant or antiplatelet                            agents. ASA Grade Assessment: II - A patient with                            mild systemic disease. After reviewing the risks                            and benefits, the patient was deemed in                            satisfactory condition to undergo the procedure.  After obtaining informed consent, the endoscope was                            passed under direct vision. Throughout the                            procedure, the patient's blood pressure, pulse, and                            oxygen saturations were monitored continuously. The                            Endoscope was introduced through the mouth, and                            advanced to the second  part of duodenum. The upper                            GI endoscopy was accomplished without difficulty.                            The patient tolerated the procedure well. Scope In: Scope Out: Findings:                 One benign-appearing, intrinsic moderate stenosis                            was found 40 cm from the incisors. This stenosis                            measured 1.5 cm (inner diameter). The scope was                            withdrawn. Dilation was performed with a Maloney                            dilator with no resistance at 54 Fr.                           The exam of the esophagus was otherwise normal.                           The stomach was normal. Small hiatal hernia present                           The examined duodenum was normal.                           The cardia and gastric fundus were normal on                            retroflexion. Complications:            No immediate complications. Estimated Blood Loss:     Estimated blood loss: none. Impression:               -  Benign-appearing esophageal stenosis. Dilated.                           - Normal stomach.                           - Normal examined duodenum.                           - No specimens collected. Recommendation:           - Patient has a contact number available for                            emergencies. The signs and symptoms of potential                            delayed complications were discussed with the                            patient. Return to normal activities tomorrow.                            Written discharge instructions were provided to the                            patient.                           - Post dilation diet.                           - Continue omeprazole 40 mg daily.                           - Please contact Dr. Marina Goodell if you have ANY further                            problems with "food sticking or hanging up" Joram Venson N. Marina Goodell MD, MD 09/17/2020  11:15:15 AM This report has been signed electronically.

## 2020-09-17 NOTE — Progress Notes (Signed)
VS by CW. ?

## 2020-09-17 NOTE — Progress Notes (Signed)
pt tolerated well. VSS. awake and to recovery. Report given to RN. Oral bite block placed and removed with ease. No trauma. 

## 2020-09-21 ENCOUNTER — Telehealth: Payer: Self-pay | Admitting: *Deleted

## 2020-09-21 NOTE — Telephone Encounter (Signed)
  Follow up Call-  Call back number 09/17/2020 06/21/2018  Post procedure Call Back phone  # [phone]  Permission to leave phone message Yes Yes  Some recent data might be hidden     Patient questions:  Do you have a fever, pain , or abdominal swelling? No. Pain Score  0 *  Have you tolerated food without any problems? Yes.    Have you been able to return to your normal activities? Yes.    Do you have any questions about your discharge instructions: Diet   No. Medications  No. Follow up visit  No.  Do you have questions or concerns about your Care? No.  Actions: * If pain score is 4 or above: 1. No action needed, pain <4.Have you developed a fever since your procedure? no  2.   Have you had an respiratory symptoms (SOB or cough) since your procedure? no  3.   Have you tested positive for COVID 19 since your procedure no  4.   Have you had any family members/close contacts diagnosed with the COVID 19 since your procedure?  no   If yes to any of these questions please route to Laverna Peace, RN and Karlton Lemon, RN

## 2020-10-08 ENCOUNTER — Other Ambulatory Visit: Payer: Self-pay | Admitting: Family Medicine

## 2020-10-11 ENCOUNTER — Other Ambulatory Visit: Payer: Self-pay

## 2020-10-11 DIAGNOSIS — E782 Mixed hyperlipidemia: Secondary | ICD-10-CM

## 2020-10-12 MED ORDER — EZETIMIBE 10 MG PO TABS: 10.0000 mg | ORAL_TABLET | Freq: Every day | ORAL | 1 refills | Status: DC

## 2020-11-03 ENCOUNTER — Other Ambulatory Visit: Payer: Self-pay | Admitting: Family Medicine

## 2020-11-03 DIAGNOSIS — M25559 Pain in unspecified hip: Secondary | ICD-10-CM

## 2020-11-03 NOTE — Progress Notes (Signed)
.  ort 

## 2020-11-04 ENCOUNTER — Other Ambulatory Visit: Payer: Self-pay | Admitting: Family Medicine

## 2020-11-04 DIAGNOSIS — E782 Mixed hyperlipidemia: Secondary | ICD-10-CM

## 2020-11-04 DIAGNOSIS — I10 Essential (primary) hypertension: Secondary | ICD-10-CM

## 2020-11-17 ENCOUNTER — Other Ambulatory Visit: Payer: Self-pay | Admitting: Family Medicine

## 2020-11-17 DIAGNOSIS — K2100 Gastro-esophageal reflux disease with esophagitis, without bleeding: Secondary | ICD-10-CM

## 2021-01-19 ENCOUNTER — Encounter: Payer: Self-pay | Admitting: Orthopaedic Surgery

## 2021-01-20 ENCOUNTER — Ambulatory Visit: Payer: BC Managed Care – PPO | Admitting: Orthopaedic Surgery

## 2021-01-25 ENCOUNTER — Other Ambulatory Visit: Payer: Self-pay | Admitting: Family Medicine

## 2021-02-11 ENCOUNTER — Other Ambulatory Visit: Payer: Self-pay | Admitting: Family Medicine

## 2021-02-12 ENCOUNTER — Other Ambulatory Visit: Payer: Self-pay

## 2021-02-12 ENCOUNTER — Other Ambulatory Visit: Payer: Self-pay | Admitting: Family Medicine

## 2021-02-12 ENCOUNTER — Encounter: Payer: Self-pay | Admitting: Family Medicine

## 2021-02-12 ENCOUNTER — Ambulatory Visit (INDEPENDENT_AMBULATORY_CARE_PROVIDER_SITE_OTHER): Payer: Managed Care, Other (non HMO) | Admitting: Family Medicine

## 2021-02-12 VITALS — BP 132/80 | HR 72 | Temp 97.9°F | Ht 71.0 in | Wt 246.0 lb

## 2021-02-12 DIAGNOSIS — Z0000 Encounter for general adult medical examination without abnormal findings: Secondary | ICD-10-CM

## 2021-02-12 DIAGNOSIS — Z125 Encounter for screening for malignant neoplasm of prostate: Secondary | ICD-10-CM

## 2021-02-12 DIAGNOSIS — K2100 Gastro-esophageal reflux disease with esophagitis, without bleeding: Secondary | ICD-10-CM

## 2021-02-12 LAB — CBC
HCT: 47.4 % (ref 39.0–52.0)
Hemoglobin: 16.1 g/dL (ref 13.0–17.0)
MCHC: 33.9 g/dL (ref 30.0–36.0)
MCV: 92.3 fl (ref 78.0–100.0)
Platelets: 243 10*3/uL (ref 150.0–400.0)
RBC: 5.14 Mil/uL (ref 4.22–5.81)
RDW: 13.2 % (ref 11.5–15.5)
WBC: 6.8 10*3/uL (ref 4.0–10.5)

## 2021-02-12 LAB — COMPREHENSIVE METABOLIC PANEL
ALT: 48 U/L (ref 0–53)
AST: 32 U/L (ref 0–37)
Albumin: 4.5 g/dL (ref 3.5–5.2)
Alkaline Phosphatase: 79 U/L (ref 39–117)
BUN: 20 mg/dL (ref 6–23)
CO2: 27 mEq/L (ref 19–32)
Calcium: 9.5 mg/dL (ref 8.4–10.5)
Chloride: 106 mEq/L (ref 96–112)
Creatinine, Ser: 0.87 mg/dL (ref 0.40–1.50)
GFR: 94.55 mL/min (ref >60.00)
Glucose, Bld: 106 mg/dL — ABNORMAL HIGH (ref 70–99)
Potassium: 4.3 mEq/L (ref 3.5–5.1)
Sodium: 143 mEq/L (ref 135–145)
Total Bilirubin: 0.5 mg/dL (ref 0.2–1.2)
Total Protein: 7.2 g/dL (ref 6.0–8.3)

## 2021-02-12 LAB — LIPID PANEL
Cholesterol: 160 mg/dL (ref 0–200)
HDL: 49.4 mg/dL (ref >39.00)
LDL Cholesterol: 90 mg/dL (ref 0–99)
NonHDL: 110.95
Total CHOL/HDL Ratio: 3
Triglycerides: 107 mg/dL (ref 0.0–149.0)
VLDL: 21.4 mg/dL (ref 0.0–40.0)

## 2021-02-12 LAB — PSA: PSA: 0.86 ng/mL (ref 0.10–4.00)

## 2021-02-12 MED ORDER — LEVOCETIRIZINE DIHYDROCHLORIDE 5 MG PO TABS
5.0000 mg | ORAL_TABLET | Freq: Every evening | ORAL | 2 refills | Status: DC
Start: 2021-02-12 — End: 2022-08-03

## 2021-02-12 NOTE — Patient Instructions (Addendum)
Give [place] 2-3 business days to get the results of your labs back.   Keep the diet clean and stay active.  Try to drink 55-60 oz of water daily outside of exercise.  The new Shingrix vaccine (for shingles) is a 2 shot series. It can make people feel low energy, achy and almost like they have the flu for 48 hours after injection. Please plan accordingly when deciding on when to get this shot. Call our office for a nurse visit appointment to get this. The second shot of the series is less severe regarding the side effects, but it still lasts 48 hours.   Claritin (loratadine), Allegra (fexofenadine), Zyrtec (cetirizine) which is also equivalent to Xyzal (levocetirizine); these are listed in order from weakest to strongest. Generic, and therefore cheaper, options are in the parentheses.   Flonase (fluticasone); nasal spray that is over the counter. 2 sprays each nostril, once daily. Aim towards the same side eye when you spray.  There are available OTC, and the generic versions, which may be cheaper, are in parentheses. Show this to a pharmacist if you have trouble finding any of these items.  The new Shingrix vaccine (for shingles) is a 2 shot series. It can make people feel low energy, achy and almost like they have the flu for 48 hours after injection. Please plan accordingly when deciding on when to get this shot. Call our office for a nurse visit appointment to get this. The second shot of the series is less severe regarding the side effects, but it still lasts 48 hours.   I do recommend you get the COVID-19 vaccination, either Pfizer or Moderna. Please don't hesitate to reach out if you have any questions.   Let us know if you need anything.

## 2021-02-12 NOTE — Progress Notes (Signed)
Chief Complaint  Patient presents with  . Annual Exam    Dizziness     Well Male Justin Townsend is here for a complete physical.   His last physical was >1 year ago.  Current diet: in general, a "healthy" diet.  Current exercise: active at work Weight trend: stable Fatigue out of ordinary? No. Seat belt? Yes.    Health maintenance Shingrix- No Colonoscopy- Yes Tetanus- Yes HIV- Yes Hep C- Yes   Past Medical History:  Diagnosis Date  . Anxiety   . Asthma    only as child; resolved by age 25  . Fatty liver   . Gallstones   . GERD (gastroesophageal reflux disease)   . History of hiatal hernia   . Hyperlipidemia   . Hypertension       Past Surgical History:  Procedure Laterality Date  . COLONOSCOPY  2019  . ESOPHAGOGASTRODUODENOSCOPY (EGD) WITH ESOPHAGEAL DILATION  2019  . ESOPHAGOGASTRODUODENOSCOPY (EGD) WITH PROPOFOL N/A 02/11/2019   Procedure: ESOPHAGOGASTRODUODENOSCOPY (EGD) WITH PROPOFOL;  Surgeon: Iva Boop, MD;  Location: [facility];  Service: Endoscopy;  Laterality: N/A;  . FOREIGN BODY REMOVAL  02/11/2019   Procedure: FOREIGN BODY REMOVAL;  Surgeon: Iva Boop, MD;  Location: [facility];  Service: Endoscopy;;  food impaction  . no colonoscopy     SOC reviewed  . ORIF TOE FRACTURE Right    foot  . QUADRICEPS TENDON REPAIR Left 09/19/2018   Procedure: REPAIR LEFT KNEE QUADRICEPS TENDON;  Surgeon: Eldred Manges, MD;  Location: [facility] OR;  Service: Orthopedics;  Laterality: Left;  . Thumb Surgery Left    pinning post dislocation    Medications  Current Outpatient Medications on File Prior to Visit  Medication Sig Dispense Refill  . ezetimibe (ZETIA) 10 MG tablet Take 1 tablet (10 mg total) by mouth daily. 90 tablet 1  . FLUoxetine (PROZAC) 10 MG tablet Take 1 tablet by mouth once daily 90 tablet 0  . lisinopril (ZESTRIL) 10 MG tablet Take 2 tablets (20 mg total) by mouth daily. 180 tablet 0  . Multiple Vitamins-Minerals (MULTIVITAMIN PO) Take 1  tablet by mouth daily.     . Omega-3 Fatty Acids (FISH OIL ULTRA) 1400 MG CAPS Take 2,800 mg by mouth daily.    Marland Kitchen omeprazole (PRILOSEC) 40 MG capsule TAKE 1 CAPSULE BY MOUTH EVERY DAY 90 capsule 2   Allergies No Known Allergies  Family History Family History  Problem Relation Age of Onset  . Ulcers Mother   . Cirrhosis Mother        ? Alcohol Related/Hep C   . Cancer Paternal Grandfather        CNS Cancer  . Panic disorder Other        Maternal FH  . Anxiety disorder Other        Maternal FH  . Heart attack Maternal Grandfather        >55  . Diabetes Neg Hx   . Stroke Neg Hx   . Colon cancer Neg Hx   . Colon polyps Neg Hx   . Esophageal cancer Neg Hx   . Pancreatic cancer Neg Hx   . Stomach cancer Neg Hx     Review of Systems: Constitutional:  no fevers Eye:  no recent significant change in vision Ear/Nose/Mouth/Throat:  Ears:  no hearing loss Nose/Mouth/Throat:  no complaints of nasal congestion, no sore throat Cardiovascular:  no chest pain Respiratory:  no shortness of breath Gastrointestinal:  no change in  bowel habits GU:  Male: negative for dysuria, frequency Musculoskeletal/Extremities:  no joint pain Integumentary (Skin/Breast):  no abnormal skin lesions reported  Neurologic:  no headaches Endocrine: No unexpected weight changes Hematologic/Lymphatic:  no abnormal bleeding  Exam BP 132/80 (BP Location: Left Arm, Patient Position: Sitting, Cuff Size: Normal)   Pulse 72   Temp 97.9 F (36.6 C) (Oral)   Ht 5\' 11"  (1.803 m)   Wt 246 lb (111.6 kg)   SpO2 96%   BMI 34.31 kg/m  General:  well developed, well nourished, in no apparent distress Skin:  no significant moles, warts, or growths Head:  no masses, lesions, or tenderness Eyes:  pupils equal and round, sclera anicteric without injection Ears:  canals without lesions, TMs shiny without retraction, no obvious effusion, no erythema Nose:  nares patent, septum midline, mucosa normal Throat/Pharynx:   lips and gingiva without lesion; tongue and uvula midline; non-inflamed pharynx; no exudates or postnasal drainage Neck: neck supple without adenopathy, thyromegaly, or masses Cardiac: RRR, no bruits, no LE edema Lungs:  clear to auscultation, breath sounds equal bilaterally, no respiratory distress Rectal: Deferred Musculoskeletal:  symmetrical muscle groups noted without atrophy or deformity Neuro:  gait normal; deep tendon reflexes normal and symmetric Psych: well oriented with normal range of affect and appropriate judgment/insight  Assessment and Plan  Well adult exam - Plan: CBC, Comprehensive metabolic panel, Lipid panel  Screening for prostate cancer - Plan: PSA   Well 60 y.o. male. Counseled on diet and exercise. Counseled on risks and benefits of prostate cancer screening with PSA. The patient agrees to undergo testing. Immunizations, labs, and further orders as above. Shingrix rec'd.  Follow up in 6 mo or prn. The patient voiced understanding and agreement to the plan.  46 [place], DO 02/12/21 7:25 AM

## 2021-03-15 ENCOUNTER — Other Ambulatory Visit: Payer: Self-pay

## 2021-03-30 ENCOUNTER — Other Ambulatory Visit: Payer: Self-pay | Admitting: Family Medicine

## 2021-03-30 DIAGNOSIS — K2100 Gastro-esophageal reflux disease with esophagitis, without bleeding: Secondary | ICD-10-CM

## 2021-04-21 ENCOUNTER — Other Ambulatory Visit: Payer: Self-pay | Admitting: Family Medicine

## 2021-04-21 DIAGNOSIS — E782 Mixed hyperlipidemia: Secondary | ICD-10-CM

## 2021-04-30 ENCOUNTER — Telehealth: Payer: Self-pay

## 2021-04-30 NOTE — Telephone Encounter (Signed)
Spoke to [place] using the phone number the [employer] rep had provided after Omeprazole was denied because patient was over 19.  PA was denied again for the same reason.  The next step is to submit an appeal.  Sent patient a mychart message to let him know.

## 2021-05-07 ENCOUNTER — Other Ambulatory Visit: Payer: Self-pay | Admitting: Family Medicine

## 2021-05-11 ENCOUNTER — Telehealth: Payer: Self-pay

## 2021-05-11 DIAGNOSIS — K2100 Gastro-esophageal reflux disease with esophagitis, without bleeding: Secondary | ICD-10-CM

## 2021-05-11 NOTE — Telephone Encounter (Signed)
Spoke with Rosann Auerbach and was told that the appeal for Omeprazole did not make a difference.  He ran every PPI through the policy and it appears that no PPI's at all are covered with this policy.  Messaged patient and told him this information and that he can take 2 Omeprazole over the counter.

## 2021-05-12 MED ORDER — OMEPRAZOLE 40 MG PO CPDR: 40.0000 mg | DELAYED_RELEASE_CAPSULE | Freq: Every day | ORAL | 3 refills | Status: DC

## 2021-06-25 ENCOUNTER — Ambulatory Visit: Payer: Managed Care, Other (non HMO) | Admitting: Family Medicine

## 2021-08-10 ENCOUNTER — Other Ambulatory Visit: Payer: Self-pay | Admitting: Family Medicine

## 2021-09-14 ENCOUNTER — Other Ambulatory Visit: Payer: Self-pay | Admitting: Family Medicine

## 2021-09-22 ENCOUNTER — Telehealth: Payer: Self-pay | Admitting: Family Medicine

## 2021-09-22 MED ORDER — FLUOXETINE HCL 10 MG PO TABS: 10.0000 mg | ORAL_TABLET | Freq: Every day | ORAL | 3 refills | Status: DC

## 2021-09-22 NOTE — Telephone Encounter (Signed)
Called the patient left detailed message refill done as requested.

## 2021-09-22 NOTE — Telephone Encounter (Signed)
Patient called to let us know he needs a refill on his Prozac and that he has been out for days. He was informed that in order to get a refill on his med he needed to make an appointment for further evaluation and labs. Patient states no one told him that, and that it is unsafe for him to be without it for so long. He asked if Dr. Carmelia Roller could fill a prescription with enough pills to last him until his appointment on 11/09. Please advice.       [facility] [address] [place], [place] - [address]  [address] [place] [address]  Phone:  [phone]  Fax:  [phone]

## 2021-09-29 ENCOUNTER — Encounter: Payer: Self-pay | Admitting: Family Medicine

## 2021-09-29 ENCOUNTER — Ambulatory Visit: Payer: Managed Care, Other (non HMO) | Admitting: Family Medicine

## 2021-09-29 ENCOUNTER — Other Ambulatory Visit: Payer: Self-pay

## 2021-09-29 ENCOUNTER — Other Ambulatory Visit: Payer: Self-pay | Admitting: Family Medicine

## 2021-09-29 VITALS — BP 120/88 | HR 68 | Temp 98.0°F | Ht 71.0 in | Wt 256.0 lb

## 2021-09-29 DIAGNOSIS — I10 Essential (primary) hypertension: Secondary | ICD-10-CM

## 2021-09-29 DIAGNOSIS — F411 Generalized anxiety disorder: Secondary | ICD-10-CM | POA: Diagnosis not present

## 2021-09-29 DIAGNOSIS — E782 Mixed hyperlipidemia: Secondary | ICD-10-CM

## 2021-09-29 LAB — COMPREHENSIVE METABOLIC PANEL
ALT: 48 U/L (ref 0–53)
AST: 37 U/L (ref 0–37)
Albumin: 4.4 g/dL (ref 3.5–5.2)
Alkaline Phosphatase: 72 U/L (ref 39–117)
BUN: 18 mg/dL (ref 6–23)
CO2: 27 mEq/L (ref 19–32)
Calcium: 9 mg/dL (ref 8.4–10.5)
Chloride: 106 mEq/L (ref 96–112)
Creatinine, Ser: 0.85 mg/dL (ref 0.40–1.50)
GFR: 94.79 mL/min (ref >60.00)
Glucose, Bld: 101 mg/dL — ABNORMAL HIGH (ref 70–99)
Potassium: 4.1 mEq/L (ref 3.5–5.1)
Sodium: 140 mEq/L (ref 135–145)
Total Bilirubin: 0.5 mg/dL (ref 0.2–1.2)
Total Protein: 7.1 g/dL (ref 6.0–8.3)

## 2021-09-29 LAB — LIPID PANEL
Cholesterol: 156 mg/dL (ref 0–200)
HDL: 48.4 mg/dL (ref >39.00)
LDL Cholesterol: 89 mg/dL (ref 0–99)
NonHDL: 107.44
Total CHOL/HDL Ratio: 3
Triglycerides: 91 mg/dL (ref 0.0–149.0)
VLDL: 18.2 mg/dL (ref 0.0–40.0)

## 2021-09-29 MED ORDER — FLUOXETINE HCL 10 MG PO TABS: 10.0000 mg | ORAL_TABLET | Freq: Every day | ORAL | 2 refills | Status: DC

## 2021-09-29 MED ORDER — LISINOPRIL 10 MG PO TABS: 20.0000 mg | ORAL_TABLET | Freq: Every day | ORAL | 2 refills | Status: DC

## 2021-09-29 MED ORDER — EZETIMIBE 10 MG PO TABS: 10.0000 mg | ORAL_TABLET | Freq: Every day | ORAL | 2 refills | Status: DC

## 2021-09-29 NOTE — Patient Instructions (Addendum)

## 2021-09-29 NOTE — Progress Notes (Signed)
Chief Complaint  Patient presents with   Follow-up    Subjective Justin Townsend is a 60 y.o. male who presents for hypertension follow up. He does not monitor home blood pressures. He is compliant with medication- lisinopril 20 mg/d. Patient has these side effects of medication: none He is sometimes adhering to a healthy diet overall. Current exercise: not lately; active at work No CP or SOB.   Hyperlipidemia Patient presents for dyslipidemia follow up. Currently being treated with Zetia 10 mg/d and compliance with treatment thus far has been good. He denies myalgias. Diet/exercise as above.  The patient is not known to have coexisting coronary artery disease.  GAD Pt is currently being treated with Prozac 10 mg/d.  Reports doing well since treatment. No thoughts of harming self or others. No self-medication with alcohol, prescription drugs or illicit drugs. Pt is not following with a counselor/psychologist.   Past Medical History:  Diagnosis Date   Anxiety    Asthma    only as child; resolved by age 69   Fatty liver    Gallstones    GERD (gastroesophageal reflux disease)    History of hiatal hernia    Hyperlipidemia    Hypertension     Exam BP 120/88   Pulse 68   Temp 98 F (36.7 C) (Oral)   Ht 5\' 11"  (1.803 m)   Wt 256 lb (116.1 kg)   SpO2 96%   BMI 35.70 kg/m  General:  well developed, well nourished, in no apparent distress Heart: RRR, no bruits, no LE edema Lungs: clear to auscultation, no accessory muscle use Psych: well oriented with normal range of affect and appropriate judgment/insight  Essential hypertension - Plan: lisinopril (ZESTRIL) 10 MG tablet  HYPERLIPIDEMIA - Plan: Comprehensive metabolic panel, Lipid panel, ezetimibe (ZETIA) 10 MG tablet  Generalized anxiety disorder - Plan: FLUoxetine (PROZAC) 10 MG tablet  Chronic, stable. Cont lisinopril 20 mg/d.  Counseled on diet and exercise.  If he is able to lose weight again, he might be able to  reduce back to 10 mg daily. Chronic, stable. Cont Zetia 10 mg/d, statin intolerant. Chronic, stable. Cont Prozac 10 mg/d.  F/u in 6 mo or prn. The patient voiced understanding and agreement to the plan.  [place], DO 09/29/21  8:25 AM

## 2021-11-01 ENCOUNTER — Encounter: Payer: Self-pay | Admitting: Family Medicine

## 2021-11-05 ENCOUNTER — Telehealth: Payer: Self-pay | Admitting: Family Medicine

## 2021-11-05 NOTE — Telephone Encounter (Signed)
Initiated PA for [employer] My Meds. KEY:  [id number] PA not complete yet/ Have called  several times [phone] [facility]) to get clinical questions. Wait time has been over 30 mins. And never am able to continue to wait due to other responsibilities in the clinic. Will continue to call when time permits.

## 2021-11-24 NOTE — Telephone Encounter (Signed)
On hold for 27 mins. Had to hang up due to clinic responsibilities

## 2021-11-24 NOTE — Telephone Encounter (Signed)
Called [employer] to get clinical questions.

## 2021-11-24 NOTE — Telephone Encounter (Signed)
f °

## 2021-12-01 ENCOUNTER — Telehealth: Payer: Self-pay | Admitting: *Deleted

## 2021-12-01 NOTE — Telephone Encounter (Signed)
Called to inform the patient left message to call back.

## 2021-12-01 NOTE — Telephone Encounter (Signed)
Prior auth started via cover my meds.  Awaiting determination.  Key: [id number]

## 2021-12-01 NOTE — Telephone Encounter (Signed)
Patient informed of response to PA.  He verbalized understanding.

## 2021-12-01 NOTE — Telephone Encounter (Signed)
Additional Information Required  Drug is covered by current benefit plan. No further PA activity needed

## 2021-12-01 NOTE — Telephone Encounter (Signed)
Last message  Additional Information Required Drug is covered by current benefit plan. No further PA activity needed Will contact the patient to inform. See telephone note dated 12/01/21. Will close this one.

## 2021-12-21 ENCOUNTER — Other Ambulatory Visit: Payer: Self-pay | Admitting: Family Medicine

## 2021-12-21 DIAGNOSIS — K2100 Gastro-esophageal reflux disease with esophagitis, without bleeding: Secondary | ICD-10-CM

## 2021-12-26 ENCOUNTER — Other Ambulatory Visit: Payer: Self-pay | Admitting: Family Medicine

## 2021-12-26 DIAGNOSIS — K2100 Gastro-esophageal reflux disease with esophagitis, without bleeding: Secondary | ICD-10-CM

## 2022-02-18 ENCOUNTER — Encounter: Payer: Managed Care, Other (non HMO) | Admitting: Family Medicine

## 2022-05-09 ENCOUNTER — Other Ambulatory Visit: Payer: Self-pay | Admitting: Family Medicine

## 2022-05-09 DIAGNOSIS — F411 Generalized anxiety disorder: Secondary | ICD-10-CM

## 2022-06-09 ENCOUNTER — Encounter: Payer: Self-pay | Admitting: Family Medicine

## 2022-06-10 ENCOUNTER — Telehealth: Payer: Self-pay

## 2022-06-10 NOTE — Telephone Encounter (Signed)
Called the patient offered a video visit this afternoon.   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 The patient stated he will go to an UC over the weekend.

## 2022-06-10 NOTE — Telephone Encounter (Signed)
Nurse Assessment Nurse: Suezanne Jacquet, RN, [name] Date/Time (Eastern Time): 06/10/2022 7:02:02 AM Confirm and document reason for call. If symptomatic, describe symptoms. ---No trauma to the foot just started hurting. Had been cleaning the shed. Soreness to the Left foot. Does the patient have any new or worsening symptoms? ---Yes Will a triage be completed? ---Yes Related visit to physician within the last 2 weeks? ---No Does the PT have any chronic conditions? (i.e. diabetes, asthma, this includes High risk factors for pregnancy, etc.) ---No Is this a behavioral health or substance abuse call? ---No Guidelines Guideline Title Affirmed Question Affirmed Notes Nurse Date/Time Justin Townsend Time) Foot Pain [1] Swollen foot AND [2] no fever (Exceptions: localized bump from bunions, calluses, insect bite, sting) Suezanne Jacquet, RN, [name] 06/10/2022 7:04:48 AM Disp. Time Justin Townsend Time) Disposition Final User 06/10/2022 7:12:40 AM See PCP within 24 Hours Yes Suezanne Jacquet, RN, [name] PLEASE NOTE: All timestamps contained within this report are represented as [place] Standard Time. CONFIDENTIALTY NOTICE: This fax transmission is intended only for the addressee. It contains information that is legally privileged, confidential or otherwise protected from use or disclosure. If you are not the intended recipient, you are strictly prohibited from reviewing, disclosing, copying using or disseminating any of this information or taking any action in reliance on or regarding this information. If you have received this fax in error, please notify us immediately by telephone so that we can arrange for its return to [place]. Phone: [phone], Toll-Free: [phone], Fax: [phone] Page: 2 of 2 Call Id: [id number] Final Disposition 06/10/2022 7:12:40 AM See PCP within 24 Hours Yes Suezanne Jacquet, RN, [name] Disagree/Comply Comply Caller Understands Yes PreDisposition InappropriateToAsk Care Advice Given Per Guideline SEE  PCP WITHIN 24 HOURS: CALL BACK IF: * Fever occurs * You become worse CARE ADVICE given per Foot Pain (Adult) guideline. Comments User: Cameron Proud, RN Date/Time Justin Townsend Time): 06/10/2022 7:05:39 AM Pain from the mid foot to the back of the toes. User: Cameron Proud, RN Date/Time Justin Townsend Time): 06/10/2022 7:08:03 AM No pain to mild pain unless active. With activity at work pain increases. 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 He is able to walk on it but usues a cluth a lot at work and it will impact his work. Advised if he comes in to be seen they can give him a work excuse for time missed for appt. He state he cannot mis. Is just wanting to stop by and have an outpatient xray on his way to work. Advised I will send a note with this chart, however will need to call back after office opens at 8 am for that appt. Referrals REFERRED TO PCP OFFICE

## 2022-06-11 ENCOUNTER — Ambulatory Visit (INDEPENDENT_AMBULATORY_CARE_PROVIDER_SITE_OTHER): Payer: Managed Care, Other (non HMO)

## 2022-06-11 ENCOUNTER — Ambulatory Visit
Admission: RE | Admit: 2022-06-11 | Discharge: 2022-06-11 | Disposition: A | Discharge status: Home / Self Care | Payer: Managed Care, Other (non HMO) | Source: Ambulatory Visit | Attending: Urgent Care

## 2022-06-11 VITALS — BP 152/90 | HR 68 | Temp 98.6°F | Resp 14

## 2022-06-11 DIAGNOSIS — M79672 Pain in left foot: Secondary | ICD-10-CM | POA: Diagnosis not present

## 2022-06-11 DIAGNOSIS — M84375A Stress fracture, left foot, initial encounter for fracture: Secondary | ICD-10-CM | POA: Diagnosis not present

## 2022-06-11 MED ORDER — HYDROCODONE-ACETAMINOPHEN 5-325 MG PO TABS
1.0000 | ORAL_TABLET | Freq: Four times a day (QID) | ORAL | 0 refills | Status: DC | PRN
Start: 2022-06-11 — End: 2022-08-03

## 2022-06-11 MED ORDER — ACETAMINOPHEN 325 MG PO TABS: 650.0000 mg | ORAL_TABLET | Freq: Four times a day (QID) | ORAL | 0 refills | Status: DC | PRN

## 2022-06-11 NOTE — ED Triage Notes (Signed)
Pt presents with c/o left foot pain that began Wednesday. NKI. No swelling. Pt requesting X-ray

## 2022-06-11 NOTE — Discharge Instructions (Addendum)
Please schedule Tylenol 500mg -650mg  every 6 hours daily with food for your severe pain.  If you still have pain despite taking Tylenol regularly, this is breakthrough pain.  You can use hydrocodone, a narcotic pain medicine, once every 4-6 hours for this.  Once your pain is better controlled, switch back to just Tylenol.   I have placed a referral to have the seen by a podiatrist.  They should contact you this upcoming week.  In the meantime wear the splint at all times.  Use crutches to move around as you need to.  Avoid putting weight on your left foot.

## 2022-06-11 NOTE — ED Provider Notes (Signed)
[facility]   MRN: [id number] DOB: Apr 08, 1961  Subjective:   Justin Townsend is a 61 y.o. male presenting for 3-day history of acute onset persistent left foot pain over the mid to lateral side distally.  No known trauma, fall, bruising, swelling.  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  Would like an x-ray done.  Has a history of a fracture in the right foot, specifically the fifth metatarsal.  History of hypertension, fatty liver, hyperlipidemia.  Patient is a light smoker.  No history of heart disease, MI.  No current facility-administered medications for this encounter.  Current Outpatient Medications:    ezetimibe (ZETIA) 10 MG tablet, Take 1 tablet (10 mg total) by mouth daily., Disp: 90 tablet, Rfl: 2   FLUoxetine (PROZAC) 10 MG tablet, TAKE 1 TABLET BY MOUTH EVERY DAY, Disp: 90 tablet, Rfl: 2   levocetirizine (XYZAL) 5 MG tablet, Take 1 tablet (5 mg total) by mouth every evening., Disp: 30 tablet, Rfl: 2   lisinopril (ZESTRIL) 10 MG tablet, Take 2 tablets (20 mg total) by mouth daily., Disp: 180 tablet, Rfl: 2   Multiple Vitamins-Minerals (MULTIVITAMIN PO), Take 1 tablet by mouth daily. , Disp: , Rfl:    Omega-3 Fatty Acids (FISH OIL ULTRA) 1400 MG CAPS, Take 2,800 mg by mouth daily., Disp: , Rfl:    omeprazole (PRILOSEC) 40 MG capsule, TAKE 1 CAPSULE BY MOUTH EVERY DAY *NOT COVERED, Disp: 90 capsule, Rfl: 1   No Known Allergies  Past Medical History:  Diagnosis Date   Anxiety    Asthma    only as child; resolved by age 65   Fatty liver    Gallstones    GERD (gastroesophageal reflux disease)    History of hiatal hernia    Hyperlipidemia    Hypertension      Past Surgical History:  Procedure Laterality Date   COLONOSCOPY  2019   ESOPHAGOGASTRODUODENOSCOPY (EGD) WITH ESOPHAGEAL DILATION  2019   ESOPHAGOGASTRODUODENOSCOPY (EGD) WITH PROPOFOL N/A 02/11/2019   Procedure: ESOPHAGOGASTRODUODENOSCOPY (EGD) WITH  PROPOFOL;  Surgeon: Iva Boop, MD;  Location: [facility];  Service: Endoscopy;  Laterality: N/A;   FOREIGN BODY REMOVAL  02/11/2019   Procedure: FOREIGN BODY REMOVAL;  Surgeon: Iva Boop, MD;  Location: [facility];  Service: Endoscopy;;  food impaction   no colonoscopy     SOC reviewed   ORIF TOE FRACTURE Right    foot   QUADRICEPS TENDON REPAIR Left 09/19/2018   Procedure: REPAIR LEFT KNEE QUADRICEPS TENDON;  Surgeon: Eldred Manges, MD;  Location: [facility];  Service: Orthopedics;  Laterality: Left;   Thumb Surgery Left    pinning post dislocation    Family History  Problem Relation Age of Onset   Ulcers Mother    Cirrhosis Mother        ? Alcohol Related/Hep C    Cancer Paternal Grandfather        CNS Cancer   Panic disorder Other        Maternal FH   Anxiety disorder Other        Maternal FH   Heart attack Maternal Grandfather        >55   Diabetes Neg Hx    Stroke Neg Hx    Colon cancer Neg Hx    Colon polyps Neg Hx    Esophageal cancer Neg Hx    Pancreatic cancer Neg Hx    Stomach  cancer Neg Hx     Social History   Tobacco Use   Smoking status: Light Smoker    Types: Cigars   Smokeless tobacco: Never   Tobacco comments:    rare cigar  Vaping Use   Vaping Use: Never used  Substance Use Topics   Alcohol use: Yes    Alcohol/week: 10.0 standard drinks of alcohol    Types: 10 Cans of beer per week    Comment: Socially on weekend   Drug use: No    ROS   Objective:   Vitals: BP (!) 152/90 (BP Location: Left Arm)   Pulse 68   Temp 98.6 F (37 C) (Oral)   Resp 14   SpO2 96%   Physical Exam Constitutional:      General: He is not in acute distress.    Appearance: Normal appearance. He is well-developed and normal weight. He is not ill-appearing, toxic-appearing or diaphoretic.  HENT:     Head: Normocephalic and atraumatic.     Right Ear: External ear normal.     Left Ear: External ear normal.     Nose: Nose normal.     Mouth/Throat:      Pharynx: Oropharynx is clear.  Eyes:     General: No scleral icterus.       Right eye: No discharge.        Left eye: No discharge.     Extraocular Movements: Extraocular movements intact.  Cardiovascular:     Rate and Rhythm: Normal rate.  Pulmonary:     Effort: Pulmonary effort is normal.  Musculoskeletal:     Cervical back: Normal range of motion.       Feet:  Neurological:     Mental Status: He is alert and oriented to person, place, and time.  Psychiatric:        Mood and Affect: Mood normal.        Behavior: Behavior normal.        Thought Content: Thought content normal.        Judgment: Judgment normal.    DG Foot Complete Left  Result Date: 06/11/2022 CLINICAL DATA:  Foot pain. EXAM: LEFT FOOT - COMPLETE 3+ VIEW COMPARISON:  None Available. FINDINGS: Nondisplaced fracture identified proximal fourth metatarsal diaphysis with associated cortical thickening/sclerosis. Cortical thickening and some deformity noted at the base of the fifth metatarsal. No other acute bony abnormality evident. IMPRESSION: 1. Cortical thickening/sclerosis associated with nondisplaced fracture in the proximal diaphysis of the fourth metatarsal. Imaging features are compatible with stress injury/stress fracture. 2. Deformity of the proximal fifth metatarsal may be related to remote trauma although stress response not excluded. Electronically Signed   By: Kennith Center M.D.   On: 06/11/2022 08:50     Assessment and Plan :   I have reviewed the PDMP during this encounter.  1. Stress fracture of metatarsal bone of left foot, initial encounter   2. Left foot pain    Patient placed into a short leg posterior splint.  Provided with crutches to ambulate.  Use Tylenol for regular pain control, hydrocodone for breakthrough pain.  I placed a referral to podiatry.  Patient is to be nonweightbearing. Counseled patient on potential for adverse effects with medications prescribed/recommended today, ER and  return-to-clinic precautions discussed, patient verbalized understanding.    Wallis Bamberg, [place] 06/11/22 [phone]

## 2022-06-14 ENCOUNTER — Ambulatory Visit: Payer: Managed Care, Other (non HMO) | Admitting: Orthopaedic Surgery

## 2022-06-14 ENCOUNTER — Encounter: Payer: Self-pay | Admitting: Orthopaedic Surgery

## 2022-06-14 DIAGNOSIS — M84375A Stress fracture, left foot, initial encounter for fracture: Secondary | ICD-10-CM | POA: Insufficient documentation

## 2022-06-14 MED ORDER — IBUPROFEN 800 MG PO TABS: 800.0000 mg | ORAL_TABLET | Freq: Two times a day (BID) | ORAL | 1 refills | Status: DC | PRN

## 2022-06-14 NOTE — Progress Notes (Signed)
Office Visit Note   Patient: Justin Townsend           Date of Birth: [birth year]           MRN: [id number] Visit Date: 06/14/2022              Requested by: Sharlene Dory, DO [address] [place],  [place] [address] PCP: Sharlene Dory, DO   Assessment & Plan: Visit Diagnoses:  1. Stress fracture of metatarsal bone of left foot, initial encounter           Left 4th metatarsal shaft  Plan: Patient can elevate his foot for swelling intermittently.  Requested prescription for ibuprofen sent in.  He can make sure he has inserts with arch buildup to help unload the metatarsal shaft we mention Superfeet inserts green-colored which will fit in his boots.  He can return in 1 month for repeat x-rays.  He can continue to work if he starts having significant increased pain and is not able to walk he will call and let us know.  Repeat x-rays on return in 1 month.  Follow-Up Instructions: No follow-ups on file.   Orders:  No orders of the defined types were placed in this encounter.  No orders of the defined types were placed in this encounter.     Procedures: No procedures performed   Clinical Data: No additional findings.   Subjective: Chief Complaint  Patient presents with   Left Foot - Pain    HPI 61 year old male started having pain last Thursday in his left foot.  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  No history of trauma to his foot he normally does a lot of walking on a daily basis with deliveries.  He is taken some ibuprofen.  Pain has been over the midfoot region.  X-rays obtained by [facility] showed a fourth metatarsal stress fracture transverse with typical cortical thickening consistent with metatarsal stress fracture.  Additional problems include hypertension hyperlipidemia history of quadriceps tendon rupture.  Patient has a history of the stress  fracture on the opposite right fifth metatarsal that later displaced or may have been a Jones fracture with slow healing.  Review of Systems all other systems noncontributory to HPI.   Objective: Vital Signs: BP (!) 159/92   Pulse 67   Ht 5\' 11"  (1.803 m)   Wt 256 lb (116.1 kg)   BMI 35.70 kg/m   Physical Exam Constitutional:      Appearance: He is well-developed.  HENT:     Head: Normocephalic and atraumatic.     Right Ear: External ear normal.     Left Ear: External ear normal.  Eyes:     Pupils: Pupils are equal, round, and reactive to light.  Neck:     Thyroid: No thyromegaly.     Trachea: No tracheal deviation.  Cardiovascular:     Rate and Rhythm: Normal rate.  Pulmonary:     Effort: Pulmonary effort is normal.     Breath sounds: No wheezing.  Abdominal:     General: Bowel sounds are normal.     Palpations: Abdomen is soft.  Musculoskeletal:     Cervical back: Neck supple.  Skin:    General: Skin is warm and dry.     Capillary Refill: Capillary refill takes less than 2 seconds.  Neurological:     Mental  Status: He is alert and oriented to person, place, and time.  Psychiatric:        Behavior: Behavior normal.        Thought Content: Thought content normal.        Judgment: Judgment normal.     Ortho Exam patient ambulates in his work boots without limp.  Tender over the shaft fourth metatarsal.  Good knee range of motion.  Specialty Comments:  No specialty comments available.  Imaging: CLINICAL DATA:  Foot pain.   EXAM: LEFT FOOT - COMPLETE 3+ VIEW   COMPARISON:  None Available.   FINDINGS: Nondisplaced fracture identified proximal fourth metatarsal diaphysis with associated cortical thickening/sclerosis. Cortical thickening and some deformity noted at the base of the fifth metatarsal. No other acute bony abnormality evident.   IMPRESSION: 1. Cortical thickening/sclerosis associated with nondisplaced fracture in the proximal diaphysis of the  fourth metatarsal. Imaging features are compatible with stress injury/stress fracture. 2. Deformity of the proximal fifth metatarsal may be related to remote trauma although stress response not excluded.     Electronically Signed   By: Kennith Center M.D.   On: 06/11/2022 08:50   PMFS History: Patient Active Problem List   Diagnosis Date Noted   Metatarsal stress fracture of left foot 06/14/2022   Esophageal obstruction due to food impaction    Quadriceps tendon rupture 09/19/2018   Rupture of left quadriceps tendon 09/18/2018   Generalized anxiety disorder 08/17/2016   Non-compliant behavior 08/12/2015   SNORING, HX OF 04/29/2010   DYSPHAGIA UNSPECIFIED 01/13/2009   HYPERLIPIDEMIA 12/25/2007   Essential hypertension 12/25/2007   NONSPEC ELEVATION OF LEVELS OF TRANSAMINASE/LDH 12/25/2007   Past Medical History:  Diagnosis Date   Anxiety    Asthma    only as child; resolved by age 1   Fatty liver    Gallstones    GERD (gastroesophageal reflux disease)    History of hiatal hernia    Hyperlipidemia    Hypertension     Family History  Problem Relation Age of Onset   Ulcers Mother    Cirrhosis Mother        ? Alcohol Related/Hep C    Cancer Paternal Grandfather        CNS Cancer   Panic disorder Other        Maternal FH   Anxiety disorder Other        Maternal FH   Heart attack Maternal Grandfather        >55   Diabetes Neg Hx    Stroke Neg Hx    Colon cancer Neg Hx    Colon polyps Neg Hx    Esophageal cancer Neg Hx    Pancreatic cancer Neg Hx    Stomach cancer Neg Hx     Past Surgical History:  Procedure Laterality Date   COLONOSCOPY  2019   ESOPHAGOGASTRODUODENOSCOPY (EGD) WITH ESOPHAGEAL DILATION  2019   ESOPHAGOGASTRODUODENOSCOPY (EGD) WITH PROPOFOL N/A 02/11/2019   Procedure: ESOPHAGOGASTRODUODENOSCOPY (EGD) WITH PROPOFOL;  Surgeon: Iva Boop, MD;  [facility];  Service: Endoscopy;  Laterality: N/A;   FOREIGN BODY REMOVAL  02/11/2019    Procedure: FOREIGN BODY REMOVAL;  Surgeon: Iva Boop, MD;  Location: [facility];  Service: Endoscopy;;  food impaction   no colonoscopy     SOC reviewed   ORIF TOE FRACTURE Right    foot   QUADRICEPS TENDON REPAIR Left 09/19/2018   Procedure: REPAIR LEFT KNEE QUADRICEPS TENDON;  Surgeon: Eldred Manges, MD;  Location: [facility];  Service: Orthopedics;  Laterality: Left;   Thumb Surgery Left    pinning post dislocation   Social History   Occupational History   Occupation: Education administrator: [employer]  Tobacco Use   Smoking status: Light Smoker    Types: Cigars   Smokeless tobacco: Never   Tobacco comments:    rare cigar  Vaping Use   Vaping Use: Never used  Substance and Sexual Activity   Alcohol use: Yes    Alcohol/week: 10.0 standard drinks of alcohol    Types: 10 Cans of beer per week    Comment: Socially on weekend   Drug use: No   Sexual activity: Yes    Partners: Female

## 2022-06-27 ENCOUNTER — Other Ambulatory Visit: Payer: Self-pay | Admitting: Family Medicine

## 2022-06-27 DIAGNOSIS — K2100 Gastro-esophageal reflux disease with esophagitis, without bleeding: Secondary | ICD-10-CM

## 2022-07-19 ENCOUNTER — Ambulatory Visit: Payer: Managed Care, Other (non HMO) | Admitting: Orthopaedic Surgery

## 2022-07-20 ENCOUNTER — Ambulatory Visit (INDEPENDENT_AMBULATORY_CARE_PROVIDER_SITE_OTHER): Payer: Managed Care, Other (non HMO)

## 2022-07-20 ENCOUNTER — Encounter: Payer: Self-pay | Admitting: Orthopaedic Surgery

## 2022-07-20 ENCOUNTER — Ambulatory Visit: Payer: Managed Care, Other (non HMO) | Admitting: Orthopaedic Surgery

## 2022-07-20 VITALS — BP 134/87 | HR 67 | Ht 71.0 in | Wt 240.0 lb

## 2022-07-20 DIAGNOSIS — M84375A Stress fracture, left foot, initial encounter for fracture: Secondary | ICD-10-CM

## 2022-07-20 NOTE — Progress Notes (Signed)
Office Visit Note   Patient: Justin Townsend           Date of Birth: Dec 21, 1960           MRN: [id number] Visit Date: 07/20/2022              Requested by: Sharlene Dory, DO [address] [place],  [place] [address] PCP: Sharlene Dory, DO   Assessment & Plan: Visit Diagnoses:  1. Stress fracture of metatarsal bone of left foot, initial encounter     Plan: Patient can return in 3 months he is having continued problems.  X-rays show interval healing and he is getting progressive improvement week to week.  Swelling is down he is walking better.  Follow-up on an as-needed basis.  Follow-Up Instructions: Return if symptoms worsen or fail to improve.   Orders:  Orders Placed This Encounter  Procedures   XR Foot Complete Left   No orders of the defined types were placed in this encounter.     Procedures: No procedures performed   Clinical Data: No additional findings.   Subjective: Chief Complaint  Patient presents with   Left Foot - Fracture, Follow-up    HPI 61 year old male returns for follow-up of fourth metatarsal stress fracture left foot.  He is walking with the boot has inserts in his tennis shoes and inserts in his house slippers and also work boots.  Drives a truck for [employer].  He has been ambulatory without limping.  Review of Systems unchanged previous quad rupture repair noted.   Objective: Vital Signs: BP 134/87   Pulse 67   Ht 5\' 11"  (1.803 m)   Wt 240 lb (108.9 kg)   BMI 33.47 kg/m   Physical Exam Constitutional:      Appearance: He is well-developed.  HENT:     Head: Normocephalic and atraumatic.     Right Ear: External ear normal.     Left Ear: External ear normal.  Eyes:     Pupils: Pupils are equal, round, and reactive to light.  Neck:     Thyroid: No thyromegaly.     Trachea: No tracheal deviation.  Cardiovascular:     Rate and Rhythm: Normal rate.  Pulmonary:     Effort: Pulmonary effort is normal.      Breath sounds: No wheezing.  Abdominal:     General: Bowel sounds are normal.     Palpations: Abdomen is soft.  Musculoskeletal:     Cervical back: Neck supple.  Skin:    General: Skin is warm and dry.     Capillary Refill: Capillary refill takes less than 2 seconds.  Neurological:     Mental Status: He is alert and oriented to person, place, and time.  Psychiatric:        Behavior: Behavior normal.        Thought Content: Thought content normal.        Judgment: Judgment normal.     Ortho Exam patient ambulates with work boots without limping.  Specialty Comments:  No specialty comments available.  Imaging: XR Foot Complete Left  Result Date: 07/20/2022 Three-view x-rays left foot obtained and reviewed this shows changes with healing of fourth metatarsal shaft stress fracture.  Comparison to 06/11/2022 images. Impression: Healing left fourth metatarsal shaft stress fracture    PMFS History: Patient Active Problem List   Diagnosis Date Noted   Metatarsal stress fracture of left foot 06/14/2022   Esophageal obstruction due  to food impaction    Quadriceps tendon rupture 09/19/2018   Rupture of left quadriceps tendon 09/18/2018   Generalized anxiety disorder 08/17/2016   Non-compliant behavior 08/12/2015   SNORING, HX OF 04/29/2010   DYSPHAGIA UNSPECIFIED 01/13/2009   HYPERLIPIDEMIA 12/25/2007   Essential hypertension 12/25/2007   NONSPEC ELEVATION OF LEVELS OF TRANSAMINASE/LDH 12/25/2007   Past Medical History:  Diagnosis Date   Anxiety    Asthma    only as child; resolved by age 78   Fatty liver    Gallstones    GERD (gastroesophageal reflux disease)    History of hiatal hernia    Hyperlipidemia    Hypertension     Family History  Problem Relation Age of Onset   Ulcers Mother    Cirrhosis Mother        ? Alcohol Related/Hep C    Cancer Paternal Grandfather        CNS Cancer   Panic disorder Other        Maternal FH   Anxiety disorder Other         Maternal FH   Heart attack Maternal Grandfather        >55   Diabetes Neg Hx    Stroke Neg Hx    Colon cancer Neg Hx    Colon polyps Neg Hx    Esophageal cancer Neg Hx    Pancreatic cancer Neg Hx    Stomach cancer Neg Hx     Past Surgical History:  Procedure Laterality Date   COLONOSCOPY  2019   ESOPHAGOGASTRODUODENOSCOPY (EGD) WITH ESOPHAGEAL DILATION  2019   ESOPHAGOGASTRODUODENOSCOPY (EGD) WITH PROPOFOL N/A 02/11/2019   Procedure: ESOPHAGOGASTRODUODENOSCOPY (EGD) WITH PROPOFOL;  Surgeon: Iva Boop, MD;  [facility];  Service: Endoscopy;  Laterality: N/A;   FOREIGN BODY REMOVAL  02/11/2019   Procedure: FOREIGN BODY REMOVAL;  Surgeon: Iva Boop, MD;  [facility];  Service: Endoscopy;;  food impaction   no colonoscopy     SOC reviewed   ORIF TOE FRACTURE Right    foot   QUADRICEPS TENDON REPAIR Left 09/19/2018   Procedure: REPAIR LEFT KNEE QUADRICEPS TENDON;  Surgeon: Eldred Manges, MD;  Location[facility];  Service: Orthopedics;  Laterality: Left;   Thumb Surgery Left    pinning post dislocation   Social History   Occupational History   Occupation: Air traffic controller: [employer]  Tobacco Use   Smoking status: Light Smoker    Types: Cigars   Smokeless tobacco: Never   Tobacco comments:    rare cigar  Vaping Use   Vaping Use: Never used  Substance and Sexual Activity   Alcohol use: Yes    Alcohol/week: 10.0 standard drinks of alcohol    Types: 10 Cans of beer per week    Comment: Socially on weekend   Drug use: No   Sexual activity: Yes    Partners: Female

## 2022-07-27 ENCOUNTER — Other Ambulatory Visit: Payer: Self-pay | Admitting: Family Medicine

## 2022-07-27 DIAGNOSIS — K2100 Gastro-esophageal reflux disease with esophagitis, without bleeding: Secondary | ICD-10-CM

## 2022-07-28 ENCOUNTER — Telehealth: Payer: Self-pay

## 2022-07-28 NOTE — Telephone Encounter (Signed)
PA initiated via Covermymeds; KEY: [id number]. PA denied.   Pts plan does not cover PPIs after age 61.    Product or Service is not covered for patient age.Please contact members pharmacy help desk.   Original Claim Info 60 NOT CVD AGE 35 AND OVER

## 2022-07-29 NOTE — Telephone Encounter (Signed)
Called the patient informed of PCP instructions. He agreed to use the [facility] at [facility] for his refill

## 2022-07-29 NOTE — Telephone Encounter (Signed)
Called no answer mailbox full

## 2022-08-03 ENCOUNTER — Encounter: Payer: Self-pay | Admitting: Family Medicine

## 2022-08-03 ENCOUNTER — Ambulatory Visit (INDEPENDENT_AMBULATORY_CARE_PROVIDER_SITE_OTHER): Payer: Managed Care, Other (non HMO) | Admitting: Family Medicine

## 2022-08-03 VITALS — BP 132/86 | HR 62 | Temp 97.8°F | Ht 71.0 in | Wt 247.5 lb

## 2022-08-03 DIAGNOSIS — K2100 Gastro-esophageal reflux disease with esophagitis, without bleeding: Secondary | ICD-10-CM

## 2022-08-03 DIAGNOSIS — Z125 Encounter for screening for malignant neoplasm of prostate: Secondary | ICD-10-CM

## 2022-08-03 DIAGNOSIS — Z0000 Encounter for general adult medical examination without abnormal findings: Secondary | ICD-10-CM | POA: Diagnosis not present

## 2022-08-03 DIAGNOSIS — I10 Essential (primary) hypertension: Secondary | ICD-10-CM

## 2022-08-03 DIAGNOSIS — E782 Mixed hyperlipidemia: Secondary | ICD-10-CM | POA: Diagnosis not present

## 2022-08-03 LAB — COMPREHENSIVE METABOLIC PANEL
ALT: 43 U/L (ref 0–53)
AST: 31 U/L (ref 0–37)
Albumin: 4.1 g/dL (ref 3.5–5.2)
Alkaline Phosphatase: 83 U/L (ref 39–117)
BUN: 20 mg/dL (ref 6–23)
CO2: 23 mEq/L (ref 19–32)
Calcium: 9.1 mg/dL (ref 8.4–10.5)
Chloride: 104 mEq/L (ref 96–112)
Creatinine, Ser: 0.91 mg/dL (ref 0.40–1.50)
GFR: 91.4 mL/min (ref >60.00)
Glucose, Bld: 97 mg/dL (ref 70–99)
Potassium: 4.3 mEq/L (ref 3.5–5.1)
Sodium: 138 mEq/L (ref 135–145)
Total Bilirubin: 0.5 mg/dL (ref 0.2–1.2)
Total Protein: 7.1 g/dL (ref 6.0–8.3)

## 2022-08-03 LAB — LIPID PANEL
Cholesterol: 152 mg/dL (ref 0–200)
HDL: 50.4 mg/dL (ref >39.00)
LDL Cholesterol: 77 mg/dL (ref 0–99)
NonHDL: 101.45
Total CHOL/HDL Ratio: 3
Triglycerides: 121 mg/dL (ref 0.0–149.0)
VLDL: 24.2 mg/dL (ref 0.0–40.0)

## 2022-08-03 LAB — CBC
HCT: 44.8 % (ref 39.0–52.0)
Hemoglobin: 15.2 g/dL (ref 13.0–17.0)
MCHC: 33.8 g/dL (ref 30.0–36.0)
MCV: 93.5 fl (ref 78.0–100.0)
Platelets: 235 10*3/uL (ref 150.0–400.0)
RBC: 4.79 Mil/uL (ref 4.22–5.81)
RDW: 13.5 % (ref 11.5–15.5)
WBC: 6.1 10*3/uL (ref 4.0–10.5)

## 2022-08-03 LAB — PSA: PSA: 0.99 ng/mL (ref 0.10–4.00)

## 2022-08-03 MED ORDER — OMEPRAZOLE 40 MG PO CPDR: 40.0000 mg | DELAYED_RELEASE_CAPSULE | Freq: Every day | ORAL | 1 refills | Status: DC

## 2022-08-03 MED ORDER — EZETIMIBE 10 MG PO TABS: 10.0000 mg | ORAL_TABLET | Freq: Every day | ORAL | 1 refills | Status: DC

## 2022-08-03 MED ORDER — LISINOPRIL 10 MG PO TABS
20.0000 mg | ORAL_TABLET | Freq: Every day | ORAL | 1 refills | Status: DC
Start: 2022-08-03 — End: 2023-04-25

## 2022-08-03 NOTE — Patient Instructions (Addendum)
Give [place] 2-3 business days to get the results of your labs back.   Keep the diet clean and stay active.  Please get me a copy of your advanced directive form at your convenience.   I recommend getting the flu shot in mid October. This suggestion would change if the CDC comes out with a different recommendation.   Please consider getting your tetanus shot.   The Shingrix vaccine (for shingles) is a 2 shot series spaced 2-6 months apart. It can make people feel low energy, achy and almost like they have the flu for 48 hours after injection. 1/5 people can have nausea and/or vomiting. Please plan accordingly when deciding on when to get this shot. Call our office for a nurse visit appointment to get this. The second shot of the series is less severe regarding the side effects, but it still lasts 48 hours.   Let us know if you need anything.

## 2022-08-03 NOTE — Progress Notes (Signed)
Chief Complaint  Patient presents with   Annual Exam    Well Male Justin Townsend is here for a complete physical.   His last physical was >1 year ago.  Current diet: in general, diet is OK.  Current exercise: active at work Weight trend: intentionally losing Fatigue out of ordinary? No. Seat belt? Yes.   Advanced directive? No  Health maintenance Shingrix- No Colonoscopy- Yes Tetanus- Due HIV- Yes Hep C- Yes   Past Medical History:  Diagnosis Date   Anxiety    Asthma    only as child; resolved by age 73   Fatty liver    Gallstones    GERD (gastroesophageal reflux disease)    History of hiatal hernia    Hyperlipidemia    Hypertension      Past Surgical History:  Procedure Laterality Date   COLONOSCOPY  2019   ESOPHAGOGASTRODUODENOSCOPY (EGD) WITH ESOPHAGEAL DILATION  2019   ESOPHAGOGASTRODUODENOSCOPY (EGD) WITH PROPOFOL N/A 02/11/2019   Procedure: ESOPHAGOGASTRODUODENOSCOPY (EGD) WITH PROPOFOL;  Surgeon: Iva Boop, MD;  Location: [facility];  Service: Endoscopy;  Laterality: N/A;   FOREIGN BODY REMOVAL  02/11/2019   Procedure: FOREIGN BODY REMOVAL;  Surgeon: Iva Boop, MD;  Location: [facility];  Service: Endoscopy;;  food impaction   no colonoscopy     SOC reviewed   ORIF TOE FRACTURE Right    foot   QUADRICEPS TENDON REPAIR Left 09/19/2018   Procedure: REPAIR LEFT KNEE QUADRICEPS TENDON;  Surgeon: Eldred Manges, MD;  Location: [facility];  Service: Orthopedics;  Laterality: Left;   Thumb Surgery Left    pinning post dislocation    Medications  Current Outpatient Medications on File Prior to Visit  Medication Sig Dispense Refill   acetaminophen (TYLENOL) 325 MG tablet Take 2 tablets (650 mg total) by mouth every 6 (six) hours as needed for moderate pain. 30 tablet 0   ezetimibe (ZETIA) 10 MG tablet Take 1 tablet (10 mg total) by mouth daily. 90 tablet 2   FLUoxetine (PROZAC) 10 MG tablet TAKE 1 TABLET BY MOUTH EVERY DAY 90 tablet 2    HYDROcodone-acetaminophen (NORCO/VICODIN) 5-325 MG tablet Take 1 tablet by mouth every 6 (six) hours as needed for severe pain. 15 tablet 0   ibuprofen (ADVIL) 800 MG tablet Take 1 tablet (800 mg total) by mouth every 12 (twelve) hours as needed. 60 tablet 1   lisinopril (ZESTRIL) 10 MG tablet Take 2 tablets (20 mg total) by mouth daily. 180 tablet 2   omeprazole (PRILOSEC) 40 MG capsule TAKE ONE CAPSULE BY MOUTH ONE TIME DAILY 90 capsule 0   Allergies No Known Allergies  Family History Family History  Problem Relation Age of Onset   Ulcers Mother    Cirrhosis Mother        ? Alcohol Related/Hep C    Cancer Paternal Grandfather        CNS Cancer   Panic disorder Other        Maternal FH   Anxiety disorder Other        Maternal FH   Heart attack Maternal Grandfather        >55   Diabetes Neg Hx    Stroke Neg Hx    Colon cancer Neg Hx    Colon polyps Neg Hx    Esophageal cancer Neg Hx    Pancreatic cancer Neg Hx    Stomach cancer Neg Hx     Review of Systems: Constitutional:  no fevers Eye:  no recent significant change in vision Ear/Nose/Mouth/Throat:  Ears:  no hearing loss Nose/Mouth/Throat:  no complaints of nasal congestion, no sore throat Cardiovascular:  no chest pain Respiratory:  no shortness of breath Gastrointestinal:  no change in bowel habits GU:  Male: negative for dysuria, frequency Musculoskeletal/Extremities:  no joint pain Integumentary (Skin/Breast):  no abnormal skin lesions reported Neurologic:  no headaches Endocrine: No unexpected weight changes Hematologic/Lymphatic:  no abnormal bleeding  Exam BP 132/86   Pulse 62   Temp 97.8 F (36.6 C) (Oral)   Ht 5\' 11"  (1.803 m)   Wt 247 lb 8 oz (112.3 kg)   SpO2 94%   BMI 34.52 kg/m  General:  well developed, well nourished, in no apparent distress Skin:  no significant moles, warts, or growths Head:  no masses, lesions, or tenderness Eyes:  pupils equal and round, sclera anicteric without  injection Ears:  canals without lesions, TMs shiny without retraction, no obvious effusion, no erythema Nose:  nares patent, septum midline, mucosa normal Throat/Pharynx:  lips and gingiva without lesion; tongue and uvula midline; non-inflamed pharynx; no exudates or postnasal drainage Neck: neck supple without adenopathy, thyromegaly, or masses Cardiac: RRR, no bruits, no LE edema Lungs:  clear to auscultation, breath sounds equal bilaterally, no respiratory distress Abdomen: BS+, soft, non-tender, non-distended, no masses or organomegaly noted Rectal: Deferred Musculoskeletal:  symmetrical muscle groups noted without atrophy or deformity Neuro:  gait normal; deep tendon reflexes normal and symmetric Psych: well oriented with normal range of affect and appropriate judgment/insight  Assessment and Plan  Well adult exam - Plan: CBC, Comprehensive metabolic panel, Lipid panel  Screening for prostate cancer - Plan: PSA  HYPERLIPIDEMIA - Plan: ezetimibe (ZETIA) 10 MG tablet  Essential hypertension - Plan: lisinopril (ZESTRIL) 10 MG tablet  Reflux esophagitis - Plan: omeprazole (PRILOSEC) 40 MG capsule   Well 61 y.o. male. Counseled on diet and exercise. Counseled on risks and benefits of prostate cancer screening with PSA. The patient agrees to undergo testing. Tdap recommended, politely declined for now.  Advanced directive form provided today.  Immunizations, labs, and further orders as above. Follow up in 6 mo or prn. The patient voiced understanding and agreement to the plan.  67 Benedict, DO 08/03/22 7:28 AM

## 2022-10-04 ENCOUNTER — Encounter: Payer: Self-pay | Admitting: Family Medicine

## 2022-10-04 ENCOUNTER — Ambulatory Visit: Payer: Managed Care, Other (non HMO) | Admitting: Family Medicine

## 2022-10-04 VITALS — BP 144/80 | HR 75 | Temp 98.1°F | Ht 71.0 in | Wt 238.0 lb

## 2022-10-04 DIAGNOSIS — J069 Acute upper respiratory infection, unspecified: Secondary | ICD-10-CM | POA: Diagnosis not present

## 2022-10-04 MED ORDER — GUAIFENESIN ER 600 MG PO TB12: 1200.0000 mg | ORAL_TABLET | Freq: Two times a day (BID) | ORAL | 2 refills | Status: DC

## 2022-10-04 MED ORDER — FLUTICASONE PROPIONATE 50 MCG/ACT NA SUSP: 2.0000 | Freq: Every day | NASAL | 0 refills | Status: DC

## 2022-10-04 NOTE — Progress Notes (Signed)
Acute Office Visit  Subjective:     Patient ID: Justin Townsend, male    DOB: [birth year], 61 y.o.   MRN: [id number]  Chief Complaint  Patient presents with   URI    URI  This is a new problem. The current episode started in the past 7 days. The problem has been waxing and waning. There has been no fever. Associated symptoms include congestion and coughing. Pertinent negatives include no chest pain, headaches, nausea, neck pain, plugged ear sensation, rash, rhinorrhea, sinus pain, sneezing, sore throat, swollen glands, vomiting or wheezing. Treatments tried: vitamins, zinc, nyquil. The treatment provided mild relief.    ROS:  A comprehensive ROS was completed and negative except as noted per HPI        Objective:    BP (!) 144/80   Pulse 75   Temp 98.1 F (36.7 C)   Ht 5\' 11"  (1.803 m)   Wt 238 lb (108 kg)   SpO2 99%   BMI 33.19 kg/m    Physical Exam Vitals reviewed.  Constitutional:      General: He is not in acute distress.    Appearance: Normal appearance. He is not ill-appearing.  HENT:     Nose: No rhinorrhea.  Cardiovascular:     Rate and Rhythm: Normal rate and regular rhythm.  Pulmonary:     Effort: Pulmonary effort is normal. No respiratory distress.     Breath sounds: Normal breath sounds. No wheezing, rhonchi or rales.  Lymphadenopathy:     Cervical: No cervical adenopathy.  Skin:    General: Skin is warm and dry.  Neurological:     General: No focal deficit present.     Mental Status: He is alert and oriented to person, place, and time. Mental status is at baseline.  Psychiatric:        Mood and Affect: Mood normal.        Behavior: Behavior normal.        Thought Content: Thought content normal.        Judgment: Judgment normal.     No results found for any visits on 10/04/22.      Assessment & Plan:   Problem List Items Addressed This Visit   None Visit Diagnoses     Viral upper respiratory tract infection    -  Primary Patient  decline flu/COVID testing.  No alarm findings on exam today.  Recommend a few more days of conservative measures - information attached to AVS.  If not improving over the next 2 days or, send [place] a message and we can try an antibiotic before the weekend.  Patient aware of signs/symptoms requiring further/urgent evaluation.     Relevant Medications   guaiFENesin (MUCINEX) 600 MG 12 hr tablet   fluticasone (FLONASE) 50 MCG/ACT nasal spray       Meds ordered this encounter  Medications   guaiFENesin (MUCINEX) 600 MG 12 hr tablet    Sig: Take 2 tablets (1,200 mg total) by mouth 2 (two) times daily.    Dispense:  30 tablet    Refill:  2    Order Specific Question:   Supervising Provider    Answer:   Danise Edge A [[id number]]   fluticasone (FLONASE) 50 MCG/ACT nasal spray    Sig: Place 2 sprays into both nostrils daily.    Dispense:  1 g    Refill:  0    Order Specific Question:   Supervising Provider    Answer:  BLYTH, STACEY A [[id number]]    Return if symptoms worsen or fail to improve.  Clayborne Dana, NP

## 2022-10-04 NOTE — Patient Instructions (Addendum)
Over the counter medications that may be helpful for symptoms:  Guaifenesin 1200 mg extended release tabs twice daily, with plenty of water For cough and congestion Brand name: Mucinex   Pseudoephedrine 30 mg, one or two tabs every 4 to 6 hours For sinus congestion Brand name: Sudafed You must get this from the pharmacy counter.  Oxymetazoline nasal spray each morning, one spray in each nostril, for NO MORE THAN 3 days  For nasal and sinus congestion Brand name: Afrin Saline nasal spray or Saline Nasal Irrigation 3-5 times a day For nasal and sinus congestion Brand names: Ocean or AYR Fluticasone nasal spray, one spray in each nostril, each morning (after oxymetazoline and saline, if used) For nasal and sinus congestion Brand name: Flonase Warm salt water gargles  For sore throat Every few hours as needed Alternate ibuprofen 400-600 mg and acetaminophen 1000 mg every 4-6 hours For fever, body aches, headache Brand names: Motrin or Advil and Tylenol Dextromethorphan 12-hour cough version 30 mg every 12 hours  For cough Brand name: Delsym Stop all other cold medications for now (Nyquil, Dayquil, Tylenol Cold, Theraflu, etc) and other non-prescription cough/cold preparations. Many of these have the same ingredients listed above and could cause an overdose of medication.   Herbal treatments that have been shown to be helpful in some patients include: Vitamin C 1000mg per day Vitamin D 4000iU per day Zinc 100mg per day Quercetin 25-500mg twice a day Melatonin 5-10mg at bedtime  General Instructions Allow your body to rest Drink PLENTY of fluids Isolate yourself from everyone, even family, until test results have returned    If you develop severe shortness of breath, uncontrolled fevers, coughing up blood, confusion, chest pain, or signs of dehydration (such as significantly decreased urine amounts or dizziness with standing) please go to the ER.  

## 2022-10-17 ENCOUNTER — Telehealth: Payer: Self-pay

## 2022-10-17 NOTE — Telephone Encounter (Signed)
error 

## 2022-10-26 ENCOUNTER — Other Ambulatory Visit: Payer: Self-pay | Admitting: Family Medicine

## 2022-10-26 DIAGNOSIS — J069 Acute upper respiratory infection, unspecified: Secondary | ICD-10-CM

## 2022-11-07 ENCOUNTER — Other Ambulatory Visit: Payer: Self-pay | Admitting: Family Medicine

## 2022-11-07 DIAGNOSIS — K2100 Gastro-esophageal reflux disease with esophagitis, without bleeding: Secondary | ICD-10-CM

## 2022-12-04 ENCOUNTER — Other Ambulatory Visit: Payer: Self-pay | Admitting: Family Medicine

## 2022-12-04 DIAGNOSIS — E782 Mixed hyperlipidemia: Secondary | ICD-10-CM

## 2023-02-15 ENCOUNTER — Other Ambulatory Visit: Payer: Self-pay | Admitting: Family Medicine

## 2023-02-15 DIAGNOSIS — K2100 Gastro-esophageal reflux disease with esophagitis, without bleeding: Secondary | ICD-10-CM

## 2023-04-25 ENCOUNTER — Other Ambulatory Visit: Payer: Self-pay | Admitting: Family Medicine

## 2023-04-25 DIAGNOSIS — F411 Generalized anxiety disorder: Secondary | ICD-10-CM

## 2023-04-25 DIAGNOSIS — I10 Essential (primary) hypertension: Secondary | ICD-10-CM

## 2023-05-29 ENCOUNTER — Other Ambulatory Visit: Payer: Self-pay | Admitting: Family Medicine

## 2023-05-29 DIAGNOSIS — K2100 Gastro-esophageal reflux disease with esophagitis, without bleeding: Secondary | ICD-10-CM

## 2023-06-03 ENCOUNTER — Other Ambulatory Visit: Payer: Self-pay | Admitting: Orthopaedic Surgery

## 2023-08-22 ENCOUNTER — Other Ambulatory Visit: Payer: Self-pay | Admitting: Family Medicine

## 2023-08-22 DIAGNOSIS — E782 Mixed hyperlipidemia: Secondary | ICD-10-CM

## 2023-09-07 ENCOUNTER — Other Ambulatory Visit: Payer: Self-pay | Admitting: Family Medicine

## 2023-09-07 DIAGNOSIS — K2100 Gastro-esophageal reflux disease with esophagitis, without bleeding: Secondary | ICD-10-CM

## 2023-09-08 ENCOUNTER — Other Ambulatory Visit: Payer: Self-pay | Admitting: Family Medicine

## 2023-09-08 DIAGNOSIS — K2100 Gastro-esophageal reflux disease with esophagitis, without bleeding: Secondary | ICD-10-CM

## 2023-09-08 MED ORDER — OMEPRAZOLE 40 MG PO CPDR: 40.0000 mg | DELAYED_RELEASE_CAPSULE | Freq: Every day | ORAL | 3 refills | Status: DC

## 2023-09-25 ENCOUNTER — Other Ambulatory Visit: Payer: Self-pay | Admitting: Family Medicine

## 2023-09-25 DIAGNOSIS — I10 Essential (primary) hypertension: Secondary | ICD-10-CM

## 2024-02-10 ENCOUNTER — Other Ambulatory Visit: Payer: Self-pay | Admitting: Family Medicine

## 2024-02-10 DIAGNOSIS — F411 Generalized anxiety disorder: Secondary | ICD-10-CM

## 2024-03-02 ENCOUNTER — Other Ambulatory Visit: Payer: Self-pay | Admitting: Family Medicine

## 2024-03-02 DIAGNOSIS — E782 Mixed hyperlipidemia: Secondary | ICD-10-CM

## 2024-04-02 ENCOUNTER — Other Ambulatory Visit: Payer: Self-pay

## 2024-04-02 ENCOUNTER — Telehealth: Payer: Self-pay

## 2024-04-02 DIAGNOSIS — Z0000 Encounter for general adult medical examination without abnormal findings: Secondary | ICD-10-CM

## 2024-04-02 DIAGNOSIS — Z125 Encounter for screening for malignant neoplasm of prostate: Secondary | ICD-10-CM

## 2024-04-02 NOTE — Telephone Encounter (Signed)
 Copied from CRM [id number]. Topic: Appointments - Appointment Scheduling >> Apr 02, 2024  8:26 AM Howard Macho wrote: Patient/patient representative is calling to schedule an appointment. Refer to attachments for appointment information.   Patient called stating he wanted to get labs done before his appointment so the doctor can have them at his appointment, but there are no labs in to make a lab appointment

## 2024-04-02 NOTE — Telephone Encounter (Signed)
 Called pt lab appt scheduled, Labs ordered.

## 2024-04-05 ENCOUNTER — Other Ambulatory Visit (INDEPENDENT_AMBULATORY_CARE_PROVIDER_SITE_OTHER)

## 2024-04-05 DIAGNOSIS — Z125 Encounter for screening for malignant neoplasm of prostate: Secondary | ICD-10-CM

## 2024-04-05 DIAGNOSIS — Z0000 Encounter for general adult medical examination without abnormal findings: Secondary | ICD-10-CM | POA: Diagnosis not present

## 2024-04-05 LAB — COMPREHENSIVE METABOLIC PANEL WITH GFR
ALT: 40 U/L (ref 0–53)
AST: 30 U/L (ref 0–37)
Albumin: 4.3 g/dL (ref 3.5–5.2)
Alkaline Phosphatase: 76 U/L (ref 39–117)
BUN: 19 mg/dL (ref 6–23)
CO2: 26 meq/L (ref 19–32)
Calcium: 9.1 mg/dL (ref 8.4–10.5)
Chloride: 105 meq/L (ref 96–112)
Creatinine, Ser: 0.83 mg/dL (ref 0.40–1.50)
GFR: 93.81 mL/min (ref >60.00)
Glucose, Bld: 96 mg/dL (ref 70–99)
Potassium: 4.2 meq/L (ref 3.5–5.1)
Sodium: 141 meq/L (ref 135–145)
Total Bilirubin: 0.5 mg/dL (ref 0.2–1.2)
Total Protein: 7 g/dL (ref 6.0–8.3)

## 2024-04-05 LAB — CBC WITH DIFFERENTIAL/PLATELET
Basophils Absolute: 0 10*3/uL (ref 0.0–0.1)
Basophils Relative: 0.6 % (ref 0.0–3.0)
Eosinophils Absolute: 0.2 10*3/uL (ref 0.0–0.7)
Eosinophils Relative: 3.5 % (ref 0.0–5.0)
HCT: 47.5 % (ref 39.0–52.0)
Hemoglobin: 15.9 g/dL (ref 13.0–17.0)
Lymphocytes Relative: 31.5 % (ref 12.0–46.0)
Lymphs Abs: 2 10*3/uL (ref 0.7–4.0)
MCHC: 33.5 g/dL (ref 30.0–36.0)
MCV: 91.9 fl (ref 78.0–100.0)
Monocytes Absolute: 0.6 10*3/uL (ref 0.1–1.0)
Monocytes Relative: 9.7 % (ref 3.0–12.0)
Neutro Abs: 3.5 10*3/uL (ref 1.4–7.7)
Neutrophils Relative %: 54.7 % (ref 43.0–77.0)
Platelets: 236 10*3/uL (ref 150.0–400.0)
RBC: 5.17 Mil/uL (ref 4.22–5.81)
RDW: 13.5 % (ref 11.5–15.5)
WBC: 6.4 10*3/uL (ref 4.0–10.5)

## 2024-04-05 LAB — LIPID PANEL
Cholesterol: 176 mg/dL (ref 0–200)
HDL: 46.7 mg/dL (ref >39.00)
LDL Cholesterol: 108 mg/dL — ABNORMAL HIGH (ref 0–99)
NonHDL: 128.96
Total CHOL/HDL Ratio: 4
Triglycerides: 107 mg/dL (ref 0.0–149.0)
VLDL: 21.4 mg/dL (ref 0.0–40.0)

## 2024-04-09 ENCOUNTER — Ambulatory Visit: Payer: Self-pay | Admitting: Family Medicine

## 2024-04-09 LAB — PSA: PSA: 0.81 ng/mL (ref 0.10–4.00)

## 2024-04-10 ENCOUNTER — Encounter: Payer: Self-pay | Admitting: Family Medicine

## 2024-04-10 ENCOUNTER — Ambulatory Visit (INDEPENDENT_AMBULATORY_CARE_PROVIDER_SITE_OTHER): Admitting: Family Medicine

## 2024-04-10 VITALS — BP 132/80 | HR 68 | Temp 97.7°F | Resp 16 | Ht 71.0 in | Wt 239.0 lb

## 2024-04-10 DIAGNOSIS — I10 Essential (primary) hypertension: Secondary | ICD-10-CM

## 2024-04-10 DIAGNOSIS — Z0000 Encounter for general adult medical examination without abnormal findings: Secondary | ICD-10-CM

## 2024-04-10 DIAGNOSIS — E782 Mixed hyperlipidemia: Secondary | ICD-10-CM | POA: Diagnosis not present

## 2024-04-10 MED ORDER — EZETIMIBE 10 MG PO TABS: 10.0000 mg | ORAL_TABLET | Freq: Every day | ORAL | 1 refills | Status: AC

## 2024-04-10 MED ORDER — LISINOPRIL 10 MG PO TABS: 10.0000 mg | ORAL_TABLET | Freq: Every day | ORAL | Status: DC

## 2024-04-10 NOTE — Progress Notes (Signed)
 Chief Complaint  Patient presents with   Annual Exam    CPE    Well Male Justin Townsend is here for a complete physical.   His last physical was >1 year ago.  Current diet: in general, diet is fair.  Current exercise: active at work Weight trend: intentionally losing Fatigue out of ordinary? No. Seat belt? Yes.   Advanced directive? No  Health maintenance Shingrix- No- refused Colonoscopy- Yes Tetanus- Due HIV- Yes Hep C- Yes   Past Medical History:  Diagnosis Date   Anxiety    Asthma    only as child; resolved by age 58   Fatty liver    Gallstones    GERD (gastroesophageal reflux disease)    History of hiatal hernia    Hyperlipidemia    Hypertension       Past Surgical History:  Procedure Laterality Date   COLONOSCOPY  2019   ESOPHAGOGASTRODUODENOSCOPY (EGD) WITH ESOPHAGEAL DILATION  2019   ESOPHAGOGASTRODUODENOSCOPY (EGD) WITH PROPOFOL  N/A 02/11/2019   Procedure: ESOPHAGOGASTRODUODENOSCOPY (EGD) WITH PROPOFOL ;  Surgeon: Kenney Peacemaker, MD;  Location: [facility];  Service: Endoscopy;  Laterality: N/A;   FOREIGN BODY REMOVAL  02/11/2019   Procedure: FOREIGN BODY REMOVAL;  Surgeon: Kenney Peacemaker, MD;  Location: [facility];  Service: Endoscopy;;  food impaction   no colonoscopy     SOC reviewed   ORIF TOE FRACTURE Right    foot   QUADRICEPS TENDON REPAIR Left 09/19/2018   Procedure: REPAIR LEFT KNEE QUADRICEPS TENDON;  Surgeon: Adah Acron, MD;  Location: [facility];  Service: Orthopedics;  Laterality: Left;   Thumb Surgery Left    pinning post dislocation    Medications  Current Outpatient Medications on File Prior to Visit  Medication Sig Dispense Refill   acetaminophen  (TYLENOL ) 325 MG tablet Take 2 tablets (650 mg total) by mouth every 6 (six) hours as needed for moderate pain. 30 tablet 0   ezetimibe  (ZETIA ) 10 MG tablet TAKE 1 TABLET BY MOUTH EVERY DAY 90 tablet 1   FLUoxetine  (PROZAC ) 10 MG tablet TAKE 1 TABLET BY MOUTH EVERY DAY 90 tablet 2    fluticasone  (FLONASE ) 50 MCG/ACT nasal spray SPRAY 2 SPRAYS INTO EACH NOSTRIL EVERY DAY 48 mL 1   guaiFENesin  (MUCINEX ) 600 MG 12 hr tablet Take 2 tablets (1,200 mg total) by mouth 2 (two) times daily. 30 tablet 2   ibuprofen  (ADVIL ) 800 MG tablet TAKE 1 TABLET (800 MG TOTAL) BY MOUTH EVERY 12 HOURS AS NEEDED 60 tablet 1   lisinopril  (ZESTRIL ) 10 MG tablet TAKE 2 TABLETS BY MOUTH EVERY DAY 180 tablet 1   omeprazole  (PRILOSEC) 40 MG capsule Take 1 capsule (40 mg total) by mouth daily. 90 capsule 3    Allergies No Known Allergies  Family History Family History  Problem Relation Age of Onset   Ulcers Mother    Cirrhosis Mother        ? Alcohol Related/Hep C    Cancer Paternal Grandfather        CNS Cancer   Panic disorder Other        Maternal FH   Anxiety disorder Other        Maternal FH   Heart attack Maternal Grandfather        >55   Diabetes Neg Hx    Stroke Neg Hx    Colon cancer Neg Hx    Colon polyps Neg Hx    Esophageal cancer Neg Hx    Pancreatic  cancer Neg Hx    Stomach cancer Neg Hx     Review of Systems: Constitutional:  no fevers Eye:  no recent significant change in vision Ear/Nose/Mouth/Throat:  Ears:  no hearing loss Nose/Mouth/Throat:  no complaints of nasal congestion, no sore throat Cardiovascular:  no chest pain Respiratory:  no shortness of breath Gastrointestinal:  no change in bowel habits GU:  Male: negative for dysuria, frequency Musculoskeletal/Extremities:  no joint pain Integumentary (Skin/Breast):  no abnormal skin lesions reported Neurologic:  no headaches Endocrine: No unexpected weight changes Hematologic/Lymphatic:  no abnormal bleeding  Exam BP 132/80 (BP Location: Left Arm, Patient Position: Sitting)   Pulse 68   Temp 97.7 F (36.5 C) (Oral)   Resp 16   Ht 5\' 11"  (1.803 m)   Wt 239 lb (108.4 kg)   SpO2 98%   BMI 33.33 kg/m  General:  well developed, well nourished, in no apparent distress Skin:  no significant moles, warts,  or growths Head:  no masses, lesions, or tenderness Eyes:  pupils equal and round, sclera anicteric without injection Ears:  canals without lesions, TMs shiny without retraction, no obvious effusion, no erythema Nose:  nares patent, mucosa normal Throat/Pharynx:  lips and gingiva without lesion; tongue and uvula midline; non-inflamed pharynx; no exudates or postnasal drainage Neck: neck supple without adenopathy, thyromegaly, or masses Cardiac: RRR, no bruits, no LE edema Lungs:  clear to auscultation, breath sounds equal bilaterally, no respiratory distress Abdomen: BS+, soft, non-tender, non-distended, no masses or organomegaly noted Rectal: Deferred Musculoskeletal:  symmetrical muscle groups noted without atrophy or deformity Neuro:  gait normal; deep tendon reflexes normal and symmetric Psych: well oriented with normal range of affect and appropriate judgment/insight  Assessment and Plan  Well adult exam  HYPERLIPIDEMIA - Plan: ezetimibe  (ZETIA ) 10 MG tablet  Essential hypertension - Plan: lisinopril  (ZESTRIL ) 10 MG tablet   Well 63 y.o. male. Counseled on diet and exercise. Tdap politely declined. Shingrix rec'd. Declined.  Advanced directive form provided today.  Immunizations, labs, and further orders as above. Follow up in 6 mo. The patient voiced understanding and agreement to the plan.  Shellie Dials Lincroft, DO 04/10/24 8:42 AM

## 2024-04-10 NOTE — Patient Instructions (Addendum)
 Give us  2-3 business days to get the results of your labs back.   Keep the diet clean and stay active.  Please get me a copy of your advanced directive form at your convenience.   Take the Prozac  every other day for 4 doses and then stop.   Please consider getting the tetanus booster which is good for 10 years.   The Shingrix vaccine (for shingles) is a 2 shot series spaced 2-6 months apart. It can make people feel low energy, achy and almost like they have the flu for 48 hours after injection. 1/5 people can have nausea and/or vomiting. Please plan accordingly when deciding on when to get this shot. Call our office for a nurse visit appointment to get this. The second shot of the series is less severe regarding the side effects, but it still lasts 48 hours.   Let us  know if you need anything.

## 2024-06-18 ENCOUNTER — Other Ambulatory Visit: Payer: Self-pay | Admitting: Family Medicine

## 2024-06-18 DIAGNOSIS — I10 Essential (primary) hypertension: Secondary | ICD-10-CM

## 2024-11-18 ENCOUNTER — Other Ambulatory Visit: Payer: Self-pay | Admitting: Family Medicine

## 2024-11-18 ENCOUNTER — Telehealth: Payer: Self-pay | Admitting: Internal Medicine

## 2024-11-18 DIAGNOSIS — K2100 Gastro-esophageal reflux disease with esophagitis, without bleeding: Secondary | ICD-10-CM

## 2024-11-18 NOTE — Telephone Encounter (Signed)
 Inbound called from pt stating that he is needing a refill on his omeprazole  and would like to speak to someone in regards to getting this refilled. Patient is requesting a call back. Please advise.

## 2024-11-19 NOTE — Telephone Encounter (Signed)
 Patient has not been seen in almost 5 years.  He needs to schedule an office visit; would you please call him for me?  Thank you!!!

## 2024-12-10 ENCOUNTER — Encounter: Payer: Self-pay | Admitting: Internal Medicine

## 2024-12-10 MED ORDER — OMEPRAZOLE 40 MG PO CPDR: 40.0000 mg | DELAYED_RELEASE_CAPSULE | Freq: Every day | ORAL | 0 refills | Status: AC

## 2024-12-10 NOTE — Telephone Encounter (Signed)
Prilosec refilled.

## 2025-02-05 ENCOUNTER — Ambulatory Visit: Admitting: Internal Medicine
